# Patient Record
Sex: Female | Born: 1995 | Race: Black or African American | Hispanic: No | Marital: Married | Smoking: Former smoker
Health system: Southern US, Community
[De-identification: ages and names within clinical notes are randomized; demographics above are authoritative.]

## PROBLEM LIST (undated history)

## (undated) ENCOUNTER — Inpatient Hospital Stay (HOSPITAL_COMMUNITY): Payer: Self-pay

## (undated) ENCOUNTER — Emergency Department (HOSPITAL_COMMUNITY): Admission: EM | Payer: Self-pay | Source: Home / Self Care

## (undated) DIAGNOSIS — J45909 Unspecified asthma, uncomplicated: Secondary | ICD-10-CM

## (undated) DIAGNOSIS — F4310 Post-traumatic stress disorder, unspecified: Secondary | ICD-10-CM

## (undated) DIAGNOSIS — F909 Attention-deficit hyperactivity disorder, unspecified type: Secondary | ICD-10-CM

## (undated) DIAGNOSIS — F319 Bipolar disorder, unspecified: Secondary | ICD-10-CM

## (undated) HISTORY — PX: ABDOMINAL HERNIA REPAIR: SHX539

## (undated) HISTORY — PX: HERNIA REPAIR: SHX51

---
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
## 2013-09-19 NOTE — L&D Delivery Note (Addendum)
Delivery Note At 1:32 PM a viable female was delivered via  (Presentation: LOA ).  APGAR: 9,9 Placenta status: intact, .  Cord: 3 vessel  with the following complications:none  Anesthesia: Epidural  Episiotomy: none Lacerations: none Suture Repair: n/a Est. Blood Loss (mL): 250  Mom to postpartum.  Baby to Couplet care / Skin to Skin.  Mary Mitchell ROCIO 05/21/2014, 1:55 PM

## 2013-12-11 ENCOUNTER — Inpatient Hospital Stay (HOSPITAL_COMMUNITY)
Admission: AD | Admit: 2013-12-11 | Discharge: 2013-12-11 | Disposition: A | Discharge status: Home / Self Care | Payer: Medicaid Other | Source: Ambulatory Visit | Attending: Obstetrics & Gynecology | Admitting: Obstetrics & Gynecology

## 2013-12-11 ENCOUNTER — Encounter (HOSPITAL_COMMUNITY): Payer: Self-pay | Admitting: *Deleted

## 2013-12-11 DIAGNOSIS — R109 Unspecified abdominal pain: Secondary | ICD-10-CM | POA: Insufficient documentation

## 2013-12-11 DIAGNOSIS — O26852 Spotting complicating pregnancy, second trimester: Secondary | ICD-10-CM

## 2013-12-11 DIAGNOSIS — O26859 Spotting complicating pregnancy, unspecified trimester: Secondary | ICD-10-CM

## 2013-12-11 HISTORY — DX: Unspecified asthma, uncomplicated: J45.909

## 2013-12-11 LAB — URINALYSIS, ROUTINE W REFLEX MICROSCOPIC
Bilirubin Urine: NEGATIVE
GLUCOSE, UA: NEGATIVE mg/dL
Hgb urine dipstick: NEGATIVE
KETONES UR: NEGATIVE mg/dL
Nitrite: NEGATIVE
PROTEIN: NEGATIVE mg/dL
Specific Gravity, Urine: >1.03 — ABNORMAL HIGH (ref 1.005–1.030)
Urobilinogen, UA: 0.2 mg/dL (ref 0.0–1.0)
pH: 6 (ref 5.0–8.0)

## 2013-12-11 LAB — WET PREP, GENITAL
CLUE CELLS WET PREP: NONE SEEN
TRICH WET PREP: NONE SEEN
Yeast Wet Prep HPF POC: NONE SEEN

## 2013-12-11 LAB — URINE MICROSCOPIC-ADD ON

## 2013-12-11 MED ORDER — RHO D IMMUNE GLOBULIN 1500 UNIT/2ML IJ SOLN
300.0000 ug | Freq: Once | INTRAMUSCULAR | Status: DC
  Filled 2013-12-11: qty 2

## 2013-12-11 NOTE — MAU Provider Note (Signed)
History     CSN: [id number]  Arrival date and time: 12/11/13 1651   First Provider Initiated Contact with Patient 12/11/13 1936      Chief Complaint  Patient presents with  . Headache  . Vaginal Bleeding  . Abdominal Cramping   HPI  Mary Mitchell is a 18 y.o. G4P3 at [redacted]w[redacted]d who presents today with spotting. She states that for the last 3 days she had had spotting that she has seen while wiping. She denies any pain She had rhogam January 3 for bleeding in the first trimester.   Past Medical History  Diagnosis Date  . Asthma     Past Surgical History  Procedure Laterality Date  . Abdominal hernia repair      History reviewed. No pertinent family history.  History  Substance Use Topics  . Smoking status: Never Smoker   . Smokeless tobacco: Not on file  . Alcohol Use: No    Allergies: No Known Allergies  Prescriptions prior to admission  Medication Sig Dispense Refill  . albuterol (PROVENTIL HFA;VENTOLIN HFA) 108 (90 BASE) MCG/ACT inhaler Inhale 1-2 puffs into the lungs every 6 (six) hours as needed for wheezing or shortness of breath.      . Prenatal Vit-Fe Fumarate-FA (PRENATAL MULTIVITAMIN) TABS tablet Take 1 tablet by mouth daily at 12 noon.        ROS Physical Exam   Blood pressure 112/66, pulse 100, temperature 98.6 F (37 C), temperature source Oral, resp. rate 18, height 5\' 2"  (1.575 m), weight 81.92 kg (180 lb 9.6 oz), last menstrual period 08/14/2013, SpO2 100.00%.  Physical Exam  Nursing note and vitals reviewed. Constitutional: She is oriented to person, place, and time. She appears well-developed and well-nourished. No distress.  Cardiovascular: Normal rate.   Respiratory: Effort normal.  GI: Soft. There is no tenderness.  Genitourinary:   External: no lesion Vagina: small amount of white discharge, no blood seen Cervix: pink, smooth, no CMT, closed. Slightly friable.  Uterus: AGA, FHT 146 with doppler   Neurological: She is alert and  oriented to person, place, and time.  Skin: Skin is warm and dry.  Psychiatric: She has a normal mood and affect.    MAU Course  Procedures  Results for orders placed during the hospital encounter of 12/11/13 (from the past 24 hour(s))  URINALYSIS, ROUTINE W REFLEX MICROSCOPIC     Status: Abnormal   Collection Time    12/11/13  5:18 PM      Result Value Ref Range   Color, Urine YELLOW  YELLOW   APPearance CLEAR  CLEAR   Specific Gravity, Urine >1.030 (*) 1.005 - 1.030   pH 6.0  5.0 - 8.0   Glucose, UA NEGATIVE  NEGATIVE mg/dL   Hgb urine dipstick NEGATIVE  NEGATIVE   Bilirubin Urine NEGATIVE  NEGATIVE   Ketones, ur NEGATIVE  NEGATIVE mg/dL   Protein, ur NEGATIVE  NEGATIVE mg/dL   Urobilinogen, UA 0.2  0.0 - 1.0 mg/dL   Nitrite NEGATIVE  NEGATIVE   Leukocytes, UA TRACE (*) NEGATIVE  URINE MICROSCOPIC-ADD ON     Status: Abnormal   Collection Time    12/11/13  5:18 PM      Result Value Ref Range   Squamous Epithelial / LPF MANY (*) RARE   WBC, UA 0-2  <3 WBC/hpf   RBC / HPF 0-2  <3 RBC/hpf   Bacteria, UA MANY (*) RARE   Urine-Other MUCOUS PRESENT    WET PREP, GENITAL  Status: Abnormal   Collection Time    12/11/13  7:45 PM      Result Value Ref Range   Yeast Wet Prep HPF POC NONE SEEN  NONE SEEN   Trich, Wet Prep NONE SEEN  NONE SEEN   Clue Cells Wet Prep HPF POC NONE SEEN  NONE SEEN   WBC, Wet Prep HPF POC MODERATE (*) NONE SEEN    D/W Dr. Despina HiddenEure will give rhogam today Patient states that she is not able to wait at this time. She states that she knows the importance of rhogam, and will come back in the morning.  Assessment and Plan   1. Spotting complicating pregnancy in second trimester, antepartum    RH negative patient Cannot wait for rhogam today Will return in the morning for rhogam Patient verbalizes understanding   Tawnya CrookHogan, Julyan Gales Donovan 12/11/2013, 7:58 PM

## 2013-12-11 NOTE — MAU Note (Signed)
Spotting for the last 3 days, cramping for the last week, passing clots.  C/O severe HA's.  [place] [facility] to [place], [facility].

## 2013-12-11 NOTE — Discharge Instructions (Signed)
Second Trimester of Pregnancy The second trimester is from week 13 through week 28, months 4 through 6. The second trimester is often a time when you feel your best. Your body has also adjusted to being pregnant, and you begin to feel better physically. Usually, morning sickness has lessened or quit completely, you may have more energy, and you may have an increase in appetite. The second trimester is also a time when the fetus is growing rapidly. At the end of the sixth month, the fetus is about 9 inches long and weighs about 1 pounds. You will likely begin to feel the baby move (quickening) between 18 and 20 weeks of the pregnancy. BODY CHANGES Your body goes through many changes during pregnancy. The changes vary from woman to woman.   Your weight will continue to increase. You will notice your lower abdomen bulging out.  You may begin to get stretch marks on your hips, abdomen, and breasts.  You may develop headaches that can be relieved by medicines approved by your caregiver.  You may urinate more often because the fetus is pressing on your bladder.  You may develop or continue to have heartburn as a result of your pregnancy.  You may develop constipation because certain hormones are causing the muscles that push waste through your intestines to slow down.  You may develop hemorrhoids or swollen, bulging veins (varicose veins).  You may have back pain because of the weight gain and pregnancy hormones relaxing your joints between the bones in your pelvis and as a result of a shift in weight and the muscles that support your balance.  Your breasts will continue to grow and be tender.  Your gums may bleed and may be sensitive to brushing and flossing.  Dark spots or blotches (chloasma, mask of pregnancy) may develop on your face. This will likely fade after the baby is born.  A dark line from your belly button to the pubic area (linea nigra) may appear. This will likely fade after the  baby is born. WHAT TO EXPECT AT YOUR PRENATAL VISITS During a routine prenatal visit:  You will be weighed to make sure you and the fetus are growing normally.  Your blood pressure will be taken.  Your abdomen will be measured to track your baby's growth.  The fetal heartbeat will be listened to.  Any test results from the previous visit will be discussed. Your caregiver may ask you:  How you are feeling.  If you are feeling the baby move.  If you have had any abnormal symptoms, such as leaking fluid, bleeding, severe headaches, or abdominal cramping.  If you have any questions. Other tests that may be performed during your second trimester include:  Blood tests that check for:  Low iron levels (anemia).  Gestational diabetes (between 24 and 28 weeks).  Rh antibodies.  Urine tests to check for infections, diabetes, or protein in the urine.  An ultrasound to confirm the proper growth and development of the baby.  An amniocentesis to check for possible genetic problems.  Fetal screens for spina bifida and Down syndrome. HOME CARE INSTRUCTIONS   Avoid all smoking, herbs, alcohol, and unprescribed drugs. These chemicals affect the formation and growth of the baby.  Follow your caregiver's instructions regarding medicine use. There are medicines that are either safe or unsafe to take during pregnancy.  Exercise only as directed by your caregiver. Experiencing uterine cramps is a good sign to stop exercising.  Continue to eat regular,   healthy meals.  Wear a good support bra for breast tenderness.  Do not use hot tubs, steam rooms, or saunas.  Wear your seat belt at all times when driving.  Avoid raw meat, uncooked cheese, cat litter boxes, and soil used by cats. These carry germs that can cause birth defects in the baby.  Take your prenatal vitamins.  Try taking a stool softener (if your caregiver approves) if you develop constipation. Eat more high-fiber foods,  such as fresh vegetables or fruit and whole grains. Drink plenty of fluids to keep your urine clear or pale yellow.  Take warm sitz baths to soothe any pain or discomfort caused by hemorrhoids. Use hemorrhoid cream if your caregiver approves.  If you develop varicose veins, wear support hose. Elevate your feet for 15 minutes, 3 4 times a day. Limit salt in your diet.  Avoid heavy lifting, wear low heel shoes, and practice good posture.  Rest with your legs elevated if you have leg cramps or low back pain.  Visit your dentist if you have not gone yet during your pregnancy. Use a soft toothbrush to brush your teeth and be gentle when you floss.  A sexual relationship may be continued unless your caregiver directs you otherwise.  Continue to go to all your prenatal visits as directed by your caregiver. SEEK MEDICAL CARE IF:   You have dizziness.  You have mild pelvic cramps, pelvic pressure, or nagging pain in the abdominal area.  You have persistent nausea, vomiting, or diarrhea.  You have a bad smelling vaginal discharge.  You have pain with urination. SEEK IMMEDIATE MEDICAL CARE IF:   You have a fever.  You are leaking fluid from your vagina.  You have spotting or bleeding from your vagina.  You have severe abdominal cramping or pain.  You have rapid weight gain or loss.  You have shortness of breath with chest pain.  You notice sudden or extreme swelling of your face, hands, ankles, feet, or legs.  You have not felt your baby move in over an hour.  You have severe headaches that do not go away with medicine.  You have vision changes. Document Released: 08/30/2001 Document Revised: 05/08/2013 Document Reviewed: 11/06/2012 [employer] Patient Information 2014 [employer].  

## 2013-12-12 LAB — GC/CHLAMYDIA PROBE AMP
CT Probe RNA: NEGATIVE
GC PROBE AMP APTIMA: NEGATIVE

## 2014-01-07 ENCOUNTER — Encounter: Payer: Self-pay | Admitting: Family Medicine

## 2014-01-17 ENCOUNTER — Telehealth: Payer: Self-pay | Admitting: Obstetrics and Gynecology

## 2014-01-17 ENCOUNTER — Inpatient Hospital Stay (HOSPITAL_COMMUNITY)
Admission: AD | Admit: 2014-01-17 | Discharge: 2014-01-17 | Disposition: A | Discharge status: Home / Self Care | Payer: Medicaid Other | Source: Ambulatory Visit | Attending: Obstetrics and Gynecology | Admitting: Obstetrics and Gynecology

## 2014-01-17 ENCOUNTER — Encounter (HOSPITAL_COMMUNITY): Payer: Self-pay | Admitting: *Deleted

## 2014-01-17 DIAGNOSIS — R030 Elevated blood-pressure reading, without diagnosis of hypertension: Secondary | ICD-10-CM | POA: Insufficient documentation

## 2014-01-17 DIAGNOSIS — IMO0001 Reserved for inherently not codable concepts without codable children: Secondary | ICD-10-CM

## 2014-01-17 DIAGNOSIS — E860 Dehydration: Secondary | ICD-10-CM

## 2014-01-17 DIAGNOSIS — R519 Headache, unspecified: Secondary | ICD-10-CM

## 2014-01-17 DIAGNOSIS — O99891 Other specified diseases and conditions complicating pregnancy: Secondary | ICD-10-CM | POA: Insufficient documentation

## 2014-01-17 DIAGNOSIS — R51 Headache: Secondary | ICD-10-CM | POA: Insufficient documentation

## 2014-01-17 DIAGNOSIS — O99330 Smoking (tobacco) complicating pregnancy, unspecified trimester: Secondary | ICD-10-CM | POA: Insufficient documentation

## 2014-01-17 DIAGNOSIS — R42 Dizziness and giddiness: Secondary | ICD-10-CM | POA: Insufficient documentation

## 2014-01-17 DIAGNOSIS — O0930 Supervision of pregnancy with insufficient antenatal care, unspecified trimester: Secondary | ICD-10-CM | POA: Insufficient documentation

## 2014-01-17 DIAGNOSIS — O26899 Other specified pregnancy related conditions, unspecified trimester: Secondary | ICD-10-CM

## 2014-01-17 DIAGNOSIS — O9989 Other specified diseases and conditions complicating pregnancy, childbirth and the puerperium: Principal | ICD-10-CM

## 2014-01-17 DIAGNOSIS — O368190 Decreased fetal movements, unspecified trimester, not applicable or unspecified: Secondary | ICD-10-CM | POA: Insufficient documentation

## 2014-01-17 DIAGNOSIS — O0932 Supervision of pregnancy with insufficient antenatal care, second trimester: Secondary | ICD-10-CM

## 2014-01-17 DIAGNOSIS — N949 Unspecified condition associated with female genital organs and menstrual cycle: Secondary | ICD-10-CM | POA: Insufficient documentation

## 2014-01-17 DIAGNOSIS — R109 Unspecified abdominal pain: Secondary | ICD-10-CM | POA: Insufficient documentation

## 2014-01-17 LAB — URINALYSIS, ROUTINE W REFLEX MICROSCOPIC
BILIRUBIN URINE: NEGATIVE
GLUCOSE, UA: NEGATIVE mg/dL
HGB URINE DIPSTICK: NEGATIVE
KETONES UR: NEGATIVE mg/dL
Leukocytes, UA: NEGATIVE
Nitrite: NEGATIVE
PH: 6.5 (ref 5.0–8.0)
Protein, ur: NEGATIVE mg/dL
Specific Gravity, Urine: >1.03 — ABNORMAL HIGH (ref 1.005–1.030)
Urobilinogen, UA: 0.2 mg/dL (ref 0.0–1.0)

## 2014-01-17 LAB — COMPREHENSIVE METABOLIC PANEL
ALT: 7 U/L (ref 0–35)
AST: 12 U/L (ref 0–37)
Albumin: 2.9 g/dL — ABNORMAL LOW (ref 3.5–5.2)
Alkaline Phosphatase: 81 U/L (ref 39–117)
BUN: 7 mg/dL (ref 6–23)
CALCIUM: 9.1 mg/dL (ref 8.4–10.5)
CO2: 23 mEq/L (ref 19–32)
Chloride: 104 mEq/L (ref 96–112)
Creatinine, Ser: 0.48 mg/dL — ABNORMAL LOW (ref 0.50–1.10)
GLUCOSE: 76 mg/dL (ref 70–99)
Potassium: 3.9 mEq/L (ref 3.7–5.3)
Sodium: 139 mEq/L (ref 137–147)
TOTAL PROTEIN: 6.5 g/dL (ref 6.0–8.3)
Total Bilirubin: <0.2 mg/dL — ABNORMAL LOW (ref 0.3–1.2)

## 2014-01-17 LAB — CBC
HEMATOCRIT: 32.7 % — AB (ref 36.0–46.0)
HEMOGLOBIN: 10.9 g/dL — AB (ref 12.0–15.0)
MCH: 26.3 pg (ref 26.0–34.0)
MCHC: 33.3 g/dL (ref 30.0–36.0)
MCV: 79 fL (ref 78.0–100.0)
Platelets: 205 10*3/uL (ref 150–400)
RBC: 4.14 MIL/uL (ref 3.87–5.11)
RDW: 13.9 % (ref 11.5–15.5)
WBC: 6.6 10*3/uL (ref 4.0–10.5)

## 2014-01-17 LAB — WET PREP, GENITAL
Trich, Wet Prep: NONE SEEN
YEAST WET PREP: NONE SEEN

## 2014-01-17 LAB — PROTEIN / CREATININE RATIO, URINE
Creatinine, Urine: 205.58 mg/dL
Protein Creatinine Ratio: 0.08 (ref 0.00–0.15)
Total Protein, Urine: 16.5 mg/dL

## 2014-01-17 MED ORDER — RHO D IMMUNE GLOBULIN 1500 UNIT/2ML IJ SOSY
300.0000 ug | PREFILLED_SYRINGE | Freq: Once | INTRAMUSCULAR | Status: AC
  Administered 2014-01-17: 300 ug via INTRAMUSCULAR
  Filled 2014-01-17: qty 2

## 2014-01-17 MED ORDER — BUTALBITAL-APAP-CAFFEINE 50-325-40 MG PO TABS
1.0000 | ORAL_TABLET | Freq: Once | ORAL | Status: AC
  Administered 2014-01-17: 1 via ORAL
  Filled 2014-01-17: qty 1

## 2014-01-17 NOTE — MAU Note (Signed)
Had spotting about 1.5 weeks ago and did not receive a rhogam shot; c/o headache ,dizziness and groin pain for past 2 weeks;

## 2014-01-17 NOTE — MAU Provider Note (Signed)
Chief Complaint:  Headache, Dizziness and Groin Pain  @MAUPATCONTACT @  HPI: Mary Mitchell is a 18 y.o. G4P3 at [redacted]w[redacted]d who presents to maternity admissions reporting headache, dizziness, and pelvic pain x 2 weeks.  Patient states that she woke up around 0200 with significant headache, felt like "whole head was swollen", associated with blurry vision with dizziness temporarily for about 5 min after standing and getting up, since resolved, currently denies blurry vision or dizziness. Admits to constant frontal HA x 1 week without relief, occasional intermittent flares lasting hours with worsening pain, not relieved rest or Tylenol. Also, reports pain on Right side of abdomen similar to contractions with also sharp shooting pains bilateral. Additionally, reports vomiting x 3 days ago after waking up, felt dizziness and lightheaded.   Patient reports that she recently had an episode with "lightish pink spotting" small amount about 1.5 weeks ago. Significant prior hx with prior vaginal bleeding episode on 12/11/13 with small amount of dark blood following pelvic exam, advised to get RhoGam (unable to stay and did not follow-up to receive it).  Also reports some irregular uterine / abdominal cramping.  Admits to decreased fetal movement, last felt move yesterday. Denies contractions, leakage of fluid or vaginal bleeding, dysuria, itching or burning.  Pregnancy Course: Currently no PNC. [facility]. Denies any hx of GHTN. - Maternal blood type O (negative)  Prior Pregnancy Course: - [facility], [facility] - x 3 full term, induced  Past Medical History: Past Medical History  Diagnosis Date  . Asthma     Past obstetric history: OB History  Gravida Para Term Preterm AB SAB TAB Ectopic Multiple Living  4 3        3     # Outcome Date GA Lbr Len/2nd Weight Sex Delivery Anes PTL Lv  4 CUR           3 PAR           2 PAR           1 PAR               Past Surgical  History: Past Surgical History  Procedure Laterality Date  . Abdominal hernia repair      Family History: Family History  Problem Relation Age of Onset  . Diabetes Mother   . Hypertension Mother   . Diabetes Maternal Grandmother   . Hypertension Maternal Grandmother   . Heart disease Maternal Grandmother     Social History: History  Substance Use Topics  . Smoking status: Current Some Day Smoker -- 0.25 packs/day  . Smokeless tobacco: Not on file  . Alcohol Use: No    Allergies: No Known Allergies  Meds:  Prescriptions prior to admission  Medication Sig Dispense Refill  . acetaminophen (TYLENOL) 500 MG tablet Take 1,000 mg by mouth every 6 (six) hours as needed for headache.      . Prenatal Vit-Fe Fumarate-FA (PRENATAL MULTIVITAMIN) TABS tablet Take 1 tablet by mouth daily at 12 noon.      Marland Kitchen albuterol (PROVENTIL HFA;VENTOLIN HFA) 108 (90 BASE) MCG/ACT inhaler Inhale 1-2 puffs into the lungs every 6 (six) hours as needed for wheezing or shortness of breath.        ROS: Pertinent findings in history of present illness.  Physical Exam  Blood pressure 128/65, pulse 112, temperature 98.5 F (36.9 C), temperature source Oral, resp. rate 18, height 5' 1.5" (1.562 m), weight 83.462 kg (184 lb), last menstrual  period 08/14/2013. GENERAL: Well-appearing, comfortable, conversational, NAD HEENT: PERRL, EOMI, mild dry MM HEART: RRR, no murmurs RESP: CTAB, nml effort ABDOMEN: Soft, non-tender to palpation, gravid appropriate for gestational age EXTREMITIES: Nontender, no edema NEURO: alert and oriented PELVIC EXAM: NEFG, no evidence of bleeding, thick physiologic white discharge, cervix appears normal without lesions.  Dilation: Closed Effacement (%): Thick Cervical Position: Posterior Station: Ballotable Exam by:: L Leftwich-Kirby CNM   Dilation: Closed Effacement (%): Thick Cervical Position: Posterior Station: Ballotable Exam by:: L Leftwich-Kirby CNM  FHT:  Baseline  150 bpm, moderate variability, accelerations present, no decelerations Contractions: none   Labs: Results for orders placed during the hospital encounter of 01/17/14 (from the past 24 hour(s))  URINALYSIS, ROUTINE W REFLEX MICROSCOPIC     Status: Abnormal   Collection Time    01/17/14  4:00 PM      Result Value Ref Range   Color, Urine YELLOW  YELLOW   APPearance CLEAR  CLEAR   Specific Gravity, Urine >1.030 (*) 1.005 - 1.030   pH 6.5  5.0 - 8.0   Glucose, UA NEGATIVE  NEGATIVE mg/dL   Hgb urine dipstick NEGATIVE  NEGATIVE   Bilirubin Urine NEGATIVE  NEGATIVE   Ketones, ur NEGATIVE  NEGATIVE mg/dL   Protein, ur NEGATIVE  NEGATIVE mg/dL   Urobilinogen, UA 0.2  0.0 - 1.0 mg/dL   Nitrite NEGATIVE  NEGATIVE   Leukocytes, UA NEGATIVE  NEGATIVE  CBC     Status: Abnormal   Collection Time    01/17/14  6:20 PM      Result Value Ref Range   WBC 6.6  4.0 - 10.5 K/uL   RBC 4.14  3.87 - 5.11 MIL/uL   Hemoglobin 10.9 (*) 12.0 - 15.0 g/dL   HCT 16.132.7 (*) 09.636.0 - 04.546.0 %   MCV 79.0  78.0 - 100.0 fL   MCH 26.3  26.0 - 34.0 pg   MCHC 33.3  30.0 - 36.0 g/dL   RDW 40.913.9  81.111.5 - 91.415.5 %   Platelets 205  150 - 400 K/uL  COMPREHENSIVE METABOLIC PANEL     Status: Abnormal   Collection Time    01/17/14  6:20 PM      Result Value Ref Range   Sodium 139  137 - 147 mEq/L   Potassium 3.9  3.7 - 5.3 mEq/L   Chloride 104  96 - 112 mEq/L   CO2 23  19 - 32 mEq/L   Glucose, Bld 76  70 - 99 mg/dL   BUN 7  6 - 23 mg/dL   Creatinine, Ser 7.820.48 (*) 0.50 - 1.10 mg/dL   Calcium 9.1  8.4 - 95.610.5 mg/dL   Total Protein 6.5  6.0 - 8.3 g/dL   Albumin 2.9 (*) 3.5 - 5.2 g/dL   AST 12  0 - 37 U/L   ALT 7  0 - 35 U/L   Alkaline Phosphatase 81  39 - 117 U/L   Total Bilirubin <0.2 (*) 0.3 - 1.2 mg/dL   GFR calc non Af Amer >90  >90 mL/min   GFR calc Af Amer >90  >90 mL/min    Imaging:  No results found. MAU Course:   Assessment: 1. Headache in pregnancy   2. Elevated blood pressure   3. Mild dehydration      Mary Mitchell is a 18 y.o. G4P3 at 2717w2d by LMP presented to MAU with multiple complaints, primarily HA x 1 week with intermittent worsening occasional blurry vision /  dizziness on standing (resolved), [facility]. Initial vitals with x1 elevated BP 149/81 (since improved to SBP 110 to 120), currently with improved mild HA, no vision changes, no RUQ pain, or edema. Additionally, FWB reassuring with good FHT >150 also placed on monitoring strip with reactive tracing. Ordered UA to evaluate for potential dehydration as etiology of some symptoms, and check urine for protein.  UPDATE 1820 - Ordered Fioricet for persistent HA, decision to further investigate with Pre-eclampsia labs for baseline data given [facility]. Ordered CMET and CBC (normal, plt, LFTs). UA with elevated spec grav >1.030 (consistent with mild dehydration, reassuring with Negative Protein on UA).  UPDATE 1855 - Pelvic exam without bleeding, moderate thick white/mucus discharge. Normal closed cervix. Collected wet prep and GC/Chalmydia.  Plan: - UA mildly dehydrated with elevated spec grav >1.030, encouraged improved hydration - No indication for RhoGam at this time, due to no active bleeding - BP monitoring with subsequent normal BPs (initial SBP >140 x 1), advise future monitoring for potential GHTN - [facility] (alrdy scheduled 01/30/14).  Outpatient anatomy scan U/S ordered. - Pending UPC, GC/Chlamydia (will notify patient if significant results, follow-up in clinic) - Discharge to home with close follow-up      Follow-up Information   [facility]. (at 9:00am - Initial Prenatal Visit)    Specialty:  Obstetrics and Gynecology   Contact information:   [address] [place] [id number] [phone]       Medication List         acetaminophen 500 MG tablet  Commonly known as:  TYLENOL   Take 1,000 mg by mouth every 6 (six) hours as needed for headache.     albuterol 108 (90 BASE) MCG/ACT inhaler  Commonly known as:  PROVENTIL HFA;VENTOLIN HFA  Inhale 1-2 puffs into the lungs every 6 (six) hours as needed for wheezing or shortness of breath.     prenatal multivitamin Tabs tablet  Take 1 tablet by mouth daily at 12 noon.        Saralyn PilarAlexander Karamalegos, DO [facility], PGY-1 01/17/2014 7:11 PM  I have seen this patient and agree with the above resident's note.  Addendum: Pt returned to MAU for Rhogam work-up and Rophylac within 1 hour of discharge.    LEFTWICH-KIRBY, Hussien Greenblatt Certified Nurse-Midwife

## 2014-01-17 NOTE — Telephone Encounter (Signed)
Called pt a few minutes after her discharge from MAU to recommend she return for Rhogam work-up and injection. Pt across the street from [facility] getting food so will return to MAU tonight for lab/injection.

## 2014-01-17 NOTE — Discharge Instructions (Signed)
Second Trimester of Pregnancy The second trimester is from week 13 through week 28, months 4 through 6. The second trimester is often a time when you feel your best. Your body has also adjusted to being pregnant, and you begin to feel better physically. Usually, morning sickness has lessened or quit completely, you may have more energy, and you may have an increase in appetite. The second trimester is also a time when the fetus is growing rapidly. At the end of the sixth month, the fetus is about 9 inches long and weighs about 1 pounds. You will likely begin to feel the baby move (quickening) between 18 and 20 weeks of the pregnancy. BODY CHANGES Your body goes through many changes during pregnancy. The changes vary from woman to woman.   Your weight will continue to increase. You will notice your lower abdomen bulging out.  You may begin to get stretch marks on your hips, abdomen, and breasts.  You may develop headaches that can be relieved by medicines approved by your caregiver.  You may urinate more often because the fetus is pressing on your bladder.  You may develop or continue to have heartburn as a result of your pregnancy.  You may develop constipation because certain hormones are causing the muscles that push waste through your intestines to slow down.  You may develop hemorrhoids or swollen, bulging veins (varicose veins).  You may have back pain because of the weight gain and pregnancy hormones relaxing your joints between the bones in your pelvis and as a result of a shift in weight and the muscles that support your balance.  Your breasts will continue to grow and be tender.  Your gums may bleed and may be sensitive to brushing and flossing.  Dark spots or blotches (chloasma, mask of pregnancy) may develop on your face. This will likely fade after the baby is born.  A dark line from your belly button to the pubic area (linea nigra) may appear. This will likely fade after the  baby is born. WHAT TO EXPECT AT YOUR PRENATAL VISITS During a routine prenatal visit:  You will be weighed to make sure you and the fetus are growing normally.  Your blood pressure will be taken.  Your abdomen will be measured to track your baby's growth.  The fetal heartbeat will be listened to.  Any test results from the previous visit will be discussed. Your caregiver may ask you:  How you are feeling.  If you are feeling the baby move.  If you have had any abnormal symptoms, such as leaking fluid, bleeding, severe headaches, or abdominal cramping.  If you have any questions. Other tests that may be performed during your second trimester include:  Blood tests that check for:  Low iron levels (anemia).  Gestational diabetes (between 24 and 28 weeks).  Rh antibodies.  Urine tests to check for infections, diabetes, or protein in the urine.  An ultrasound to confirm the proper growth and development of the baby.  An amniocentesis to check for possible genetic problems.  Fetal screens for spina bifida and Down syndrome. HOME CARE INSTRUCTIONS   Avoid all smoking, herbs, alcohol, and unprescribed drugs. These chemicals affect the formation and growth of the baby.  Follow your caregiver's instructions regarding medicine use. There are medicines that are either safe or unsafe to take during pregnancy.  Exercise only as directed by your caregiver. Experiencing uterine cramps is a good sign to stop exercising.  Continue to eat regular,  healthy meals.  Wear a good support bra for breast tenderness.  Do not use hot tubs, steam rooms, or saunas.  Wear your seat belt at all times when driving.  Avoid raw meat, uncooked cheese, cat litter boxes, and soil used by cats. These carry germs that can cause birth defects in the baby.  Take your prenatal vitamins.  Try taking a stool softener (if your caregiver approves) if you develop constipation. Eat more high-fiber foods,  such as fresh vegetables or fruit and whole grains. Drink plenty of fluids to keep your urine clear or pale yellow.  Take warm sitz baths to soothe any pain or discomfort caused by hemorrhoids. Use hemorrhoid cream if your caregiver approves.  If you develop varicose veins, wear support hose. Elevate your feet for 15 minutes, 3 4 times a day. Limit salt in your diet.  Avoid heavy lifting, wear low heel shoes, and practice good posture.  Rest with your legs elevated if you have leg cramps or low back pain.  Visit your dentist if you have not gone yet during your pregnancy. Use a soft toothbrush to brush your teeth and be gentle when you floss.  A sexual relationship may be continued unless your caregiver directs you otherwise.  Continue to go to all your prenatal visits as directed by your caregiver. SEEK MEDICAL CARE IF:   You have dizziness.  You have mild pelvic cramps, pelvic pressure, or nagging pain in the abdominal area.  You have persistent nausea, vomiting, or diarrhea.  You have a bad smelling vaginal discharge.  You have pain with urination. SEEK IMMEDIATE MEDICAL CARE IF:   You have a fever.  You are leaking fluid from your vagina.  You have spotting or bleeding from your vagina.  You have severe abdominal cramping or pain.  You have rapid weight gain or loss.  You have shortness of breath with chest pain.  You notice sudden or extreme swelling of your face, hands, ankles, feet, or legs.  You have not felt your baby move in over an hour.  You have severe headaches that do not go away with medicine.  You have vision changes. Document Released: 08/30/2001 Document Revised: 05/08/2013 Document Reviewed: 11/06/2012 [facility] Patient Information 2014 [place], [place]. Headaches, Frequently Asked Questions MIGRAINE HEADACHES Q: What is migraine? What causes it? How can I treat it? A: Generally, migraine headaches begin as a dull ache. Then they develop into a  constant, throbbing, and pulsating pain. You may experience pain at the temples. You may experience pain at the front or back of one or both sides of the head. The pain is usually accompanied by a combination of:  Nausea.  Vomiting.  Sensitivity to light and noise. Some people (about 15%) experience an aura (see below) before an attack. The cause of migraine is believed to be chemical reactions in the brain. Treatment for migraine may include over-the-counter or prescription medications. It may also include self-help techniques. These include relaxation training and biofeedback.  Q: What is an aura? A: About 15% of people with migraine get an "aura". This is a sign of neurological symptoms that occur before a migraine headache. You may see wavy or jagged lines, dots, or flashing lights. You might experience tunnel vision or blind spots in one or both eyes. The aura can include visual or auditory hallucinations (something imagined). It may include disruptions in smell (such as strange odors), taste or touch. Other symptoms include:  Numbness.  A "pins and needles" sensation.  Difficulty in recalling or speaking the correct word. These neurological events may last as long as 60 minutes. These symptoms will fade as the headache begins. Q: What is a trigger? A: Certain physical or environmental factors can lead to or "trigger" a migraine. These include:  Foods.  Hormonal changes.  Weather.  Stress. It is important to remember that triggers are different for everyone. To help prevent migraine attacks, you need to figure out which triggers affect you. Keep a headache diary. This is a good way to track triggers. The diary will help you talk to your healthcare professional about your condition. Q: Does weather affect migraines? A: Bright sunshine, hot, humid conditions, and drastic changes in barometric pressure may lead to, or "trigger," a migraine attack in some people. But studies have shown  that weather does not act as a trigger for everyone with migraines. Q: What is the link between migraine and hormones? A: Hormones start and regulate many of your body's functions. Hormones keep your body in balance within a constantly changing environment. The levels of hormones in your body are unbalanced at times. Examples are during menstruation, pregnancy, or menopause. That can lead to a migraine attack. In fact, about three quarters of all women with migraine report that their attacks are related to the menstrual cycle.  Q: Is there an increased risk of stroke for migraine sufferers? A: The likelihood of a migraine attack causing a stroke is very remote. That is not to say that migraine sufferers cannot have a stroke associated with their migraines. In persons under age 42, the most common associated factor for stroke is migraine headache. But over the course of a person's normal life span, the occurrence of migraine headache may actually be associated with a reduced risk of dying from cerebrovascular disease due to stroke.  Q: What are acute medications for migraine? A: Acute medications are used to treat the pain of the headache after it has started. Examples over-the-counter medications, NSAIDs, ergots, and triptans.  Q: What are the triptans? A: Triptans are the newest class of abortive medications. They are specifically targeted to treat migraine. Triptans are vasoconstrictors. They moderate some chemical reactions in the brain. The triptans work on receptors in your brain. Triptans help to restore the balance of a neurotransmitter called serotonin. Fluctuations in levels of serotonin are thought to be a main cause of migraine.  Q: Are over-the-counter medications for migraine effective? A: Over-the-counter, or "OTC," medications may be effective in relieving mild to moderate pain and associated symptoms of migraine. But you should see your caregiver before beginning any treatment regimen for  migraine.  Q: What are preventive medications for migraine? A: Preventive medications for migraine are sometimes referred to as "prophylactic" treatments. They are used to reduce the frequency, severity, and length of migraine attacks. Examples of preventive medications include antiepileptic medications, antidepressants, beta-blockers, calcium channel blockers, and NSAIDs (nonsteroidal anti-inflammatory drugs). Q: Why are anticonvulsants used to treat migraine? A: During the past few years, there has been an increased interest in antiepileptic drugs for the prevention of migraine. They are sometimes referred to as "anticonvulsants". Both epilepsy and migraine may be caused by similar reactions in the brain.  Q: Why are antidepressants used to treat migraine? A: Antidepressants are typically used to treat people with depression. They may reduce migraine frequency by regulating chemical levels, such as serotonin, in the brain.  Q: What alternative therapies are used to treat migraine? A: The term "alternative therapies" is often used  to describe treatments considered outside the scope of conventional Western medicine. Examples of alternative therapy include acupuncture, acupressure, and yoga. Another common alternative treatment is herbal therapy. Some herbs are believed to relieve headache pain. Always discuss alternative therapies with your caregiver before proceeding. Some herbal products contain arsenic and other toxins. TENSION HEADACHES Q: What is a tension-type headache? What causes it? How can I treat it? A: Tension-type headaches occur randomly. They are often the result of temporary stress, anxiety, fatigue, or anger. Symptoms include soreness in your temples, a tightening band-like sensation around your head (a "vice-like" ache). Symptoms can also include a pulling feeling, pressure sensations, and contracting head and neck muscles. The headache begins in your forehead, temples, or the back of  your head and neck. Treatment for tension-type headache may include over-the-counter or prescription medications. Treatment may also include self-help techniques such as relaxation training and biofeedback. CLUSTER HEADACHES Q: What is a cluster headache? What causes it? How can I treat it? A: Cluster headache gets its name because the attacks come in groups. The pain arrives with little, if any, warning. It is usually on one side of the head. A tearing or bloodshot eye and a runny nose on the same side of the headache may also accompany the pain. Cluster headaches are believed to be caused by chemical reactions in the brain. They have been described as the most severe and intense of any headache type. Treatment for cluster headache includes prescription medication and oxygen. SINUS HEADACHES Q: What is a sinus headache? What causes it? How can I treat it? A: When a cavity in the bones of the face and skull (a sinus) becomes inflamed, the inflammation will cause localized pain. This condition is usually the result of an allergic reaction, a tumor, or an infection. If your headache is caused by a sinus blockage, such as an infection, you will probably have a fever. An x-ray will confirm a sinus blockage. Your caregiver's treatment might include antibiotics for the infection, as well as antihistamines or decongestants.  REBOUND HEADACHES Q: What is a rebound headache? What causes it? How can I treat it? A: A pattern of taking acute headache medications too often can lead to a condition known as "rebound headache." A pattern of taking too much headache medication includes taking it more than 2 days per week or in excessive amounts. That means more than the label or a caregiver advises. With rebound headaches, your medications not only stop relieving pain, they actually begin to cause headaches. Doctors treat rebound headache by tapering the medication that is being overused. Sometimes your caregiver will  gradually substitute a different type of treatment or medication. Stopping may be a challenge. Regularly overusing a medication increases the potential for serious side effects. Consult a caregiver if you regularly use headache medications more than 2 days per week or more than the label advises. ADDITIONAL QUESTIONS AND ANSWERS Q: What is biofeedback? A: Biofeedback is a self-help treatment. Biofeedback uses special equipment to monitor your body's involuntary physical responses. Biofeedback monitors:  Breathing.  Pulse.  Heart rate.  Temperature.  Muscle tension.  Brain activity. Biofeedback helps you refine and perfect your relaxation exercises. You learn to control the physical responses that are related to stress. Once the technique has been mastered, you do not need the equipment any more. Q: Are headaches hereditary? A: Four out of five (80%) of people that suffer report a family history of migraine. Scientists are not sure if this is  genetic or a family predisposition. Despite the uncertainty, a child has a 50% chance of having migraine if one parent suffers. The child has a 75% chance if both parents suffer.  Q: Can children get headaches? A: By the time they reach high school, most young people have experienced some type of headache. Many safe and effective approaches or medications can prevent a headache from occurring or stop it after it has begun.  Q: What type of doctor should I see to diagnose and treat my headache? A: Start with your primary caregiver. Discuss his or her experience and approach to headaches. Discuss methods of classification, diagnosis, and treatment. Your caregiver may decide to recommend you to a headache specialist, depending upon your symptoms or other physical conditions. Having diabetes, allergies, etc., may require a more comprehensive and inclusive approach to your headache. The National Headache Foundation will provide, upon request, a list of [facility]  physician members in your state. Document Released: 11/26/2003 Document Revised: 11/28/2011 Document Reviewed: 05/05/2008 [facility] Patient Information 2014 [place], [place].

## 2014-01-17 NOTE — MAU Note (Signed)
Last night woke up,  And felt like whole head was swollen. Has a massive headache, got better, than bad again. Saw spots when first woke up.

## 2014-01-17 NOTE — MAU Note (Signed)
1535- not in lobby- had taken kids to bathroom. Still gone now

## 2014-01-17 NOTE — MAU Provider Note (Signed)
Attestation of Attending Supervision of Advanced Practitioner (CNM/NP): Evaluation and management procedures were performed by the Advanced Practitioner under my supervision and collaboration.  I have reviewed the Advanced Practitioner's note and chart, and I agree with the management and plan.  Vickii Volland 01/17/2014 9:19 PM

## 2014-01-18 ENCOUNTER — Ambulatory Visit (HOSPITAL_COMMUNITY): Payer: Medicaid Other

## 2014-01-18 LAB — RH IG WORKUP (INCLUDES ABO/RH)
ABO/RH(D): A NEG
Antibody Screen: NEGATIVE
Fetal Screen: NEGATIVE
GESTATIONAL AGE(WKS): 22
Unit division: 0

## 2014-01-18 LAB — GC/CHLAMYDIA PROBE AMP
CT PROBE, AMP APTIMA: NEGATIVE
GC Probe RNA: NEGATIVE

## 2014-01-20 NOTE — Progress Notes (Signed)
Chart reviewed for clarification to assist radiology scheduler in scheduling appropriate appointment for patient.

## 2014-01-21 ENCOUNTER — Other Ambulatory Visit (HOSPITAL_COMMUNITY): Payer: Self-pay | Admitting: Advanced Practice Midwife

## 2014-01-21 ENCOUNTER — Ambulatory Visit (HOSPITAL_COMMUNITY)
Admission: RE | Admit: 2014-01-21 | Discharge: 2014-01-21 | Disposition: A | Discharge status: Home / Self Care | Payer: Medicaid Other | Source: Ambulatory Visit | Attending: Advanced Practice Midwife | Admitting: Advanced Practice Midwife

## 2014-01-21 DIAGNOSIS — O0932 Supervision of pregnancy with insufficient antenatal care, second trimester: Secondary | ICD-10-CM

## 2014-01-21 DIAGNOSIS — O0930 Supervision of pregnancy with insufficient antenatal care, unspecified trimester: Secondary | ICD-10-CM | POA: Insufficient documentation

## 2014-01-21 DIAGNOSIS — Z3689 Encounter for other specified antenatal screening: Secondary | ICD-10-CM | POA: Insufficient documentation

## 2014-01-21 DIAGNOSIS — Z3480 Encounter for supervision of other normal pregnancy, unspecified trimester: Secondary | ICD-10-CM

## 2014-01-22 ENCOUNTER — Encounter: Payer: Medicaid Other | Admitting: Advanced Practice Midwife

## 2014-01-24 DIAGNOSIS — Z3480 Encounter for supervision of other normal pregnancy, unspecified trimester: Secondary | ICD-10-CM | POA: Insufficient documentation

## 2014-01-30 ENCOUNTER — Encounter: Payer: Self-pay | Admitting: Family Medicine

## 2014-01-30 ENCOUNTER — Ambulatory Visit (INDEPENDENT_AMBULATORY_CARE_PROVIDER_SITE_OTHER): Payer: Medicaid Other | Admitting: Family Medicine

## 2014-01-30 VITALS — BP 127/82 | HR 99 | Temp 98.1°F | Ht 63.0 in | Wt 185.9 lb

## 2014-01-30 DIAGNOSIS — Z720 Tobacco use: Secondary | ICD-10-CM

## 2014-01-30 DIAGNOSIS — J4520 Mild intermittent asthma, uncomplicated: Secondary | ICD-10-CM

## 2014-01-30 DIAGNOSIS — Z3480 Encounter for supervision of other normal pregnancy, unspecified trimester: Secondary | ICD-10-CM

## 2014-01-30 DIAGNOSIS — J45909 Unspecified asthma, uncomplicated: Secondary | ICD-10-CM

## 2014-01-30 DIAGNOSIS — F17200 Nicotine dependence, unspecified, uncomplicated: Secondary | ICD-10-CM

## 2014-01-30 LAB — POCT URINALYSIS DIP (DEVICE)
Bilirubin Urine: NEGATIVE
Glucose, UA: NEGATIVE mg/dL
Hgb urine dipstick: NEGATIVE
KETONES UR: NEGATIVE mg/dL
Leukocytes, UA: NEGATIVE
Nitrite: NEGATIVE
PROTEIN: NEGATIVE mg/dL
Urobilinogen, UA: 0.2 mg/dL (ref 0.0–1.0)
pH: 7 (ref 5.0–8.0)

## 2014-01-30 MED ORDER — ALBUTEROL SULFATE HFA 108 (90 BASE) MCG/ACT IN AERS: 1.0000 | INHALATION_SPRAY | Freq: Four times a day (QID) | RESPIRATORY_TRACT | Status: DC | PRN

## 2014-01-30 MED ORDER — NICOTINE 14 MG/24HR TD PT24: 14.0000 mg | MEDICATED_PATCH | Freq: Every day | TRANSDERMAL | Status: DC

## 2014-01-30 NOTE — Progress Notes (Signed)
Pt seen for first OB visit today.  Obese pregravid but wt gain is wnl today.  C/o severe N/V.  Eating 1x/day.  Drinking water and sweet tea mostly.  Taking PNV and albuterol daily.  NKFA.  Discussed wt gain goal of 11-20# overall.  Encouraged client to eat small meals/snacks q2-3 hrs to help ease nausea.  [facility]  Provided written and verbal education on healthy eating during pregnancy.  F/u if referred.   Melanee LeftErin Cashwell, MPH RD LDN

## 2014-01-30 NOTE — Addendum Note (Signed)
Addended by: Vale HavenBECK, Veronia Laprise L on: 01/30/2014 12:26 PM   Modules accepted: Orders

## 2014-01-30 NOTE — Progress Notes (Signed)
  Subjective:    Mary Mitchell is a [id number] being seen today for her first obstetrical visit.  Her obstetrical history is significant for smoker. Patient does intend to breast feed. Pregnancy history fully reviewed.  Patient reports nasal congestion and nasal irritation.  has been sick for a week. Also having headaches during her pregnancy- throbbing pain in the front of her head. Taking tylenol extra strength- usually knocks it out but that isn't helping anymore.   Filed Vitals:   01/30/14 0913 01/30/14 0915  BP: 127/82   Pulse: 99   Temp: 98.1 F (36.7 C)   Height:  5\' 3"  (1.6 m)  Weight: 84.324 kg (185 lb 14.4 oz)     HISTORY: OB History  Gravida Para Term Preterm AB SAB TAB Ectopic Multiple Living  4 3 3  0 0 0 0 0 0 3    # Outcome Date GA Lbr Len/2nd Weight Sex Delivery Anes PTL Lv  4 CUR           3 TRM 03/12/13 47101w0d  2.977 kg (6 lb 9 oz) F SVD EPI  Y     Comments: baby came out blue, "was stuck for a while, low oxygen to brain"   2 TRM 03/08/11 6544w0d  2.381 kg (5 lb 4 oz) F SVD EPI  Y  1 TRM 04/07/10 1719w0d  2.807 kg (6 lb 3 oz) F SVD EPI  Y     Past Medical History  Diagnosis Date  . Asthma    Past Surgical History  Procedure Laterality Date  . Abdominal hernia repair     Family History  Problem Relation Age of Onset  . Diabetes Mother   . Hypertension Mother   . Diabetes Maternal Grandmother   . Hypertension Maternal Grandmother   . Heart disease Maternal Grandmother      Exam    Uterus:   size appropriate for dates.   Pelvic Exam: Deferred    Skin: normal coloration and turgor, no rashes    Neurologic: normal   Extremities: normal strength, tone, and muscle mass   HEENT PERRLA and extra ocular movement intact   Mouth/Teeth mucous membranes moist, pharynx normal without lesions   Neck supple   Cardiovascular: regular rate and rhythm   Respiratory:  appears well, vitals normal, no respiratory distress, acyanotic, normal RR, ear and  throat exam is normal, neck free of mass or lymphadenopathy, chest clear, no wheezing, crepitations, rhonchi, normal symmetric air entry   Abdomen: soft, non-tender; bowel sounds normal; no masses,  no organomegaly   Urinary: urethral meatus normal      Assessment:    Pregnancy: [id number] Patient Active Problem List   Diagnosis Date Noted  . Supervision of normal subsequent pregnancy 01/24/2014        Plan:     Initial labs drawn. Prenatal vitamins. Problem list reviewed and updated. Genetic Screening discussed too late  Ultrasound discussed; fetal survey: results reviewed.  Follow up in 4 weeks. 50% of 30 min visit spent on counseling and coordination of care.     Tzvi Economou L Binnie Vonderhaar 01/30/2014

## 2014-01-30 NOTE — Patient Instructions (Signed)
Second Trimester of Pregnancy The second trimester is from week 13 through week 28, months 4 through 6. The second trimester is often a time when you feel your best. Your body has also adjusted to being pregnant, and you begin to feel better physically. Usually, morning sickness has lessened or quit completely, you may have more energy, and you may have an increase in appetite. The second trimester is also a time when the fetus is growing rapidly. At the end of the sixth month, the fetus is about 9 inches long and weighs about 1 pounds. You will likely begin to feel the baby move (quickening) between 18 and 20 weeks of the pregnancy. BODY CHANGES Your body goes through many changes during pregnancy. The changes vary from woman to woman.   Your weight will continue to increase. You will notice your lower abdomen bulging out.  You may begin to get stretch marks on your hips, abdomen, and breasts.  You may develop headaches that can be relieved by medicines approved by your caregiver.  You may urinate more often because the fetus is pressing on your bladder.  You may develop or continue to have heartburn as a result of your pregnancy.  You may develop constipation because certain hormones are causing the muscles that push waste through your intestines to slow down.  You may develop hemorrhoids or swollen, bulging veins (varicose veins).  You may have back pain because of the weight gain and pregnancy hormones relaxing your joints between the bones in your pelvis and as a result of a shift in weight and the muscles that support your balance.  Your breasts will continue to grow and be tender.  Your gums may bleed and may be sensitive to brushing and flossing.  Dark spots or blotches (chloasma, mask of pregnancy) may develop on your face. This will likely fade after the baby is born.  A dark line from your belly button to the pubic area (linea nigra) may appear. This will likely fade after the  baby is born. WHAT TO EXPECT AT YOUR PRENATAL VISITS During a routine prenatal visit:  You will be weighed to make sure you and the fetus are growing normally.  Your blood pressure will be taken.  Your abdomen will be measured to track your baby's growth.  The fetal heartbeat will be listened to.  Any test results from the previous visit will be discussed. Your caregiver may ask you:  How you are feeling.  If you are feeling the baby move.  If you have had any abnormal symptoms, such as leaking fluid, bleeding, severe headaches, or abdominal cramping.  If you have any questions. Other tests that may be performed during your second trimester include:  Blood tests that check for:  Low iron levels (anemia).  Gestational diabetes (between 24 and 28 weeks).  Rh antibodies.  Urine tests to check for infections, diabetes, or protein in the urine.  An ultrasound to confirm the proper growth and development of the baby.  An amniocentesis to check for possible genetic problems.  Fetal screens for spina bifida and Down syndrome. HOME CARE INSTRUCTIONS   Avoid all smoking, herbs, alcohol, and unprescribed drugs. These chemicals affect the formation and growth of the baby.  Follow your caregiver's instructions regarding medicine use. There are medicines that are either safe or unsafe to take during pregnancy.  Exercise only as directed by your caregiver. Experiencing uterine cramps is a good sign to stop exercising.  Continue to eat regular,   healthy meals.  Wear a good support bra for breast tenderness.  Do not use hot tubs, steam rooms, or saunas.  Wear your seat belt at all times when driving.  Avoid raw meat, uncooked cheese, cat litter boxes, and soil used by cats. These carry germs that can cause birth defects in the baby.  Take your prenatal vitamins.  Try taking a stool softener (if your caregiver approves) if you develop constipation. Eat more high-fiber foods,  such as fresh vegetables or fruit and whole grains. Drink plenty of fluids to keep your urine clear or pale yellow.  Take warm sitz baths to soothe any pain or discomfort caused by hemorrhoids. Use hemorrhoid cream if your caregiver approves.  If you develop varicose veins, wear support hose. Elevate your feet for 15 minutes, 3 4 times a day. Limit salt in your diet.  Avoid heavy lifting, wear low heel shoes, and practice good posture.  Rest with your legs elevated if you have leg cramps or low back pain.  Visit your dentist if you have not gone yet during your pregnancy. Use a soft toothbrush to brush your teeth and be gentle when you floss.  A sexual relationship may be continued unless your caregiver directs you otherwise.  Continue to go to all your prenatal visits as directed by your caregiver. SEEK MEDICAL CARE IF:   You have dizziness.  You have mild pelvic cramps, pelvic pressure, or nagging pain in the abdominal area.  You have persistent nausea, vomiting, or diarrhea.  You have a bad smelling vaginal discharge.  You have pain with urination. SEEK IMMEDIATE MEDICAL CARE IF:   You have a fever.  You are leaking fluid from your vagina.  You have spotting or bleeding from your vagina.  You have severe abdominal cramping or pain.  You have rapid weight gain or loss.  You have shortness of breath with chest pain.  You notice sudden or extreme swelling of your face, hands, ankles, feet, or legs.  You have not felt your baby move in over an hour.  You have severe headaches that do not go away with medicine.  You have vision changes. Document Released: 08/30/2001 Document Revised: 05/08/2013 Document Reviewed: 11/06/2012 Exit[employer] Patient Information 2014 [employer].  

## 2014-01-30 NOTE — Progress Notes (Signed)
Extensive smoking hx. rx of nicotine patches. counseled on fetal risks.

## 2014-01-31 ENCOUNTER — Encounter: Payer: Self-pay | Admitting: Family Medicine

## 2014-01-31 DIAGNOSIS — O26899 Other specified pregnancy related conditions, unspecified trimester: Secondary | ICD-10-CM

## 2014-01-31 DIAGNOSIS — Z6791 Unspecified blood type, Rh negative: Secondary | ICD-10-CM | POA: Insufficient documentation

## 2014-01-31 LAB — ANTIBODY TITER (PRENATAL TITER)

## 2014-01-31 LAB — OBSTETRIC PANEL
Antibody Screen: POSITIVE — AB
BASOS ABS: 0 10*3/uL (ref 0.0–0.1)
BASOS PCT: 0 % (ref 0–1)
Eosinophils Absolute: 0.1 10*3/uL (ref 0.0–0.7)
Eosinophils Relative: 1 % (ref 0–5)
HCT: 31.4 % — ABNORMAL LOW (ref 36.0–46.0)
Hemoglobin: 10.6 g/dL — ABNORMAL LOW (ref 12.0–15.0)
Hepatitis B Surface Ag: NEGATIVE
Lymphocytes Relative: 28 % (ref 12–46)
Lymphs Abs: 1.9 10*3/uL (ref 0.7–4.0)
MCH: 26.2 pg (ref 26.0–34.0)
MCHC: 33.8 g/dL (ref 30.0–36.0)
MCV: 77.5 fL — ABNORMAL LOW (ref 78.0–100.0)
MONOS PCT: 6 % (ref 3–12)
Monocytes Absolute: 0.4 10*3/uL (ref 0.1–1.0)
NEUTROS ABS: 4.4 10*3/uL (ref 1.7–7.7)
Neutrophils Relative %: 65 % (ref 43–77)
Platelets: 265 10*3/uL (ref 150–400)
RBC: 4.05 MIL/uL (ref 3.87–5.11)
RDW: 14.1 % (ref 11.5–15.5)
Rh Type: NEGATIVE
Rubella: 1.9 Index — ABNORMAL HIGH (ref <0.90)
WBC: 6.7 10*3/uL (ref 4.0–10.5)

## 2014-01-31 LAB — PRENATAL ANTIBODY IDENTIFICATION

## 2014-01-31 LAB — HIV ANTIBODY (ROUTINE TESTING W REFLEX): HIV 1&2 Ab, 4th Generation: NONREACTIVE

## 2014-01-31 LAB — GLUCOSE TOLERANCE, 1 HOUR (50G) W/O FASTING: GLUCOSE 1 HOUR GTT: 86 mg/dL (ref 70–140)

## 2014-02-01 ENCOUNTER — Encounter: Payer: Self-pay | Admitting: *Deleted

## 2014-02-01 LAB — PRESCRIPTION MONITORING PROFILE (19 PANEL)
Amphetamine/Meth: NEGATIVE ng/mL
BARBITURATE SCREEN, URINE: NEGATIVE ng/mL
BENZODIAZEPINE SCREEN, URINE: NEGATIVE ng/mL
Buprenorphine, Urine: NEGATIVE ng/mL
CANNABINOID SCRN UR: NEGATIVE ng/mL
COCAINE METABOLITES: NEGATIVE ng/mL
Carisoprodol, Urine: NEGATIVE ng/mL
Creatinine, Urine: 172.5 mg/dL (ref >20.0)
FENTANYL URINE: NEGATIVE ng/mL
MDMA URINE: NEGATIVE ng/mL
Meperidine, Ur: NEGATIVE ng/mL
Methadone Screen, Urine: NEGATIVE ng/mL
Methaqualone: NEGATIVE ng/mL
Nitrites, Initial: NEGATIVE ug/mL
OPIATE SCREEN, URINE: NEGATIVE ng/mL
Oxycodone Screen, Ur: NEGATIVE ng/mL
Phencyclidine, Ur: NEGATIVE ng/mL
Propoxyphene: NEGATIVE ng/mL
Tapentadol, urine: NEGATIVE ng/mL
Tramadol Scrn, Ur: NEGATIVE ng/mL
Zolpidem, Urine: NEGATIVE ng/mL
pH, Initial: 7.3 pH (ref 4.5–8.9)

## 2014-02-03 LAB — HEMOGLOBINOPATHY EVALUATION
HEMOGLOBIN OTHER: 0 %
HGB A2 QUANT: 2.4 % (ref 2.2–3.2)
HGB F QUANT: 0 % (ref 0.0–2.0)
Hgb A: 97.6 % (ref 96.8–97.8)
Hgb S Quant: 0 %

## 2014-02-05 ENCOUNTER — Telehealth: Payer: Self-pay

## 2014-02-05 DIAGNOSIS — N390 Urinary tract infection, site not specified: Secondary | ICD-10-CM

## 2014-02-05 LAB — CULTURE, OB URINE: Colony Count: >=100000

## 2014-02-05 MED ORDER — CEPHALEXIN 500 MG PO CAPS: 500.0000 mg | ORAL_CAPSULE | Freq: Four times a day (QID) | ORAL | Status: DC

## 2014-02-05 NOTE — Telephone Encounter (Signed)
Message copied by Louanna RawAMPBELL, Jennise Both M on Wed Feb 05, 2014  4:39 PM ------      Message from: Vale HavenBECK, KELI L      Created: Wed Feb 05, 2014  2:37 PM       Urine cx with >100,000 colonies. Can we treat her with keflex x 5 days? ------

## 2014-02-05 NOTE — Telephone Encounter (Signed)
Keflex 500mg  QID X 5 days prescribed. Called pt. And informed of results and prescription--- patient verbalized understanding. No questions or concerns.

## 2014-02-27 ENCOUNTER — Encounter: Payer: Medicaid Other | Admitting: Family Medicine

## 2014-03-05 ENCOUNTER — Inpatient Hospital Stay (HOSPITAL_COMMUNITY)
Admission: AD | Admit: 2014-03-05 | Discharge: 2014-03-05 | Disposition: A | Discharge status: Home / Self Care | Payer: Medicaid Other | Source: Ambulatory Visit | Attending: Obstetrics and Gynecology | Admitting: Obstetrics and Gynecology

## 2014-03-05 ENCOUNTER — Encounter (HOSPITAL_COMMUNITY): Payer: Self-pay | Admitting: *Deleted

## 2014-03-05 DIAGNOSIS — O99330 Smoking (tobacco) complicating pregnancy, unspecified trimester: Secondary | ICD-10-CM | POA: Insufficient documentation

## 2014-03-05 DIAGNOSIS — O360990 Maternal care for other rhesus isoimmunization, unspecified trimester, not applicable or unspecified: Secondary | ICD-10-CM

## 2014-03-05 DIAGNOSIS — N949 Unspecified condition associated with female genital organs and menstrual cycle: Secondary | ICD-10-CM

## 2014-03-05 DIAGNOSIS — O26899 Other specified pregnancy related conditions, unspecified trimester: Secondary | ICD-10-CM

## 2014-03-05 DIAGNOSIS — Z3480 Encounter for supervision of other normal pregnancy, unspecified trimester: Secondary | ICD-10-CM

## 2014-03-05 DIAGNOSIS — Z6791 Unspecified blood type, Rh negative: Secondary | ICD-10-CM

## 2014-03-05 DIAGNOSIS — O99891 Other specified diseases and conditions complicating pregnancy: Secondary | ICD-10-CM | POA: Insufficient documentation

## 2014-03-05 DIAGNOSIS — R109 Unspecified abdominal pain: Secondary | ICD-10-CM | POA: Insufficient documentation

## 2014-03-05 DIAGNOSIS — Z720 Tobacco use: Secondary | ICD-10-CM

## 2014-03-05 DIAGNOSIS — O9989 Other specified diseases and conditions complicating pregnancy, childbirth and the puerperium: Principal | ICD-10-CM

## 2014-03-05 LAB — URINALYSIS, ROUTINE W REFLEX MICROSCOPIC
Bilirubin Urine: NEGATIVE
Glucose, UA: NEGATIVE mg/dL
Hgb urine dipstick: NEGATIVE
Ketones, ur: 15 mg/dL — AB
LEUKOCYTES UA: NEGATIVE
NITRITE: NEGATIVE
PROTEIN: NEGATIVE mg/dL
Specific Gravity, Urine: >1.03 — ABNORMAL HIGH (ref 1.005–1.030)
Urobilinogen, UA: 1 mg/dL (ref 0.0–1.0)
pH: 6 (ref 5.0–8.0)

## 2014-03-05 NOTE — MAU Provider Note (Signed)
History     CSN: [id number]  Arrival date and time: 03/05/14 1953   None     Chief Complaint  Patient presents with  . Abdominal Cramping   HPI Ms Mary Mitchell is a 17yo [id number] @ 29.0wks presenting for eval of abd cramping; denies bldg or leaking; reports sm white vag d/c. No dysuria, +FM. [facility] and has been remarkable for 1) late onset of care @ 22wks with only 2 visits so far (none since 5/14) 2) smoker.   OB History   Grav Para Term Preterm Abortions TAB SAB Ect Mult Living   4 3 3  0 0 0 0 0 0 3      Past Medical History  Diagnosis Date  . Asthma     Past Surgical History  Procedure Laterality Date  . Abdominal hernia repair      Family History  Problem Relation Age of Onset  . Diabetes Mother   . Hypertension Mother   . Diabetes Maternal Grandmother   . Hypertension Maternal Grandmother   . Heart disease Maternal Grandmother     History  Substance Use Topics  . Smoking status: Former Smoker -- 0.25 packs/day    Types: Cigarettes  . Smokeless tobacco: Not on file  . Alcohol Use: No    Allergies: No Known Allergies  Prescriptions prior to admission  Medication Sig Dispense Refill  . acetaminophen (TYLENOL) 500 MG tablet Take 1,000 mg by mouth every 6 (six) hours as needed for headache.      . albuterol (PROVENTIL HFA;VENTOLIN HFA) 108 (90 BASE) MCG/ACT inhaler Inhale 1-2 puffs into the lungs every 6 (six) hours as needed for wheezing or shortness of breath.  1 Inhaler  3  . cephALEXin (KEFLEX) 500 MG capsule Take 1 capsule (500 mg total) by mouth 4 (four) times daily.  20 capsule  0  . Prenatal Vit-Fe Fumarate-FA (PRENATAL MULTIVITAMIN) TABS tablet Take 1 tablet by mouth daily at 12 noon.        ROS Physical Exam   Blood pressure 127/60, pulse 109, temperature 99 F (37.2 C), temperature source Oral, resp. rate 18, height 5' 1.3" (1.557 m), weight 85.095 kg (187 lb 9.6 oz), last menstrual period 08/14/2013, SpO2  100.00%.  Physical Exam  Constitutional: She is oriented to person, place, and time. She appears well-developed.  HENT:  Head: Normocephalic.  Neck: Normal range of motion.  Cardiovascular:  Sl tachy  Respiratory: Effort normal.  GI:  EFM 120s, +accels, no decels No ctx per toco  Genitourinary: Vagina normal.  Cx C/L  Musculoskeletal: Normal range of motion.  Neurological: She is alert and oriented to person, place, and time.  Skin: Skin is warm and dry.  Psychiatric: She has a normal mood and affect. Her behavior is normal. Thought content normal.   Urinalysis    Component Value Date/Time   COLORURINE YELLOW 03/05/2014 2114   APPEARANCEUR CLEAR 03/05/2014 2114   LABSPEC >1.030* 03/05/2014 2114   PHURINE 6.0 03/05/2014 2114   GLUCOSEU NEGATIVE 03/05/2014 2114   HGBUR NEGATIVE 03/05/2014 2114   BILIRUBINUR NEGATIVE 03/05/2014 2114   KETONESUR 15* 03/05/2014 2114   PROTEINUR NEGATIVE 03/05/2014 2114   UROBILINOGEN 1.0 03/05/2014 2114   NITRITE NEGATIVE 03/05/2014 2114   LEUKOCYTESUR NEGATIVE 03/05/2014 2114      MAU Course  Procedures    Assessment and Plan  IUP @ 29.0wks Round lig pain  D/C home with preterm labor precautions Will notify pt with abnl  urine results (unable to stay for results due to childcare) F/U as scheduled on June 25th or sooner prn Declines wet prep tonight- will notify if still concerned re vag d/c at her next visit  Cam HaiSHAW, KIMBERLY CNM 03/05/2014, 9:53 PM

## 2014-03-05 NOTE — Discharge Instructions (Signed)

## 2014-03-05 NOTE — MAU Note (Signed)
Pt. Having cramping in her lower abdomen since this am. Reports it as a sharp pain that comes and goes. Has been drinking tea today and no water. Denies bleeding. Does report a milky discharge that began a few weeks ago in the past week has noted a smell. Denies any leakage of fluid. Baby is moving well.

## 2014-03-05 NOTE — MAU Note (Signed)
Pt c/o cramping that started today while at work. Has not drank much today. Has only had 3 cups of tea. Denies vaginal bleeding but has a milky discharge. +FM. Denies urinary symptoms

## 2014-03-10 NOTE — MAU Provider Note (Signed)
Attestation of Attending Supervision of Advanced Practitioner: Evaluation and management procedures were performed by the PA/NP/CNM/OB Fellow under my supervision/collaboration. Chart reviewed and agree with management and plan.  FERGUSON,JOHN V 03/10/2014 3:10 AM

## 2014-03-13 ENCOUNTER — Encounter: Payer: Medicaid Other | Admitting: Family

## 2014-03-31 ENCOUNTER — Inpatient Hospital Stay (HOSPITAL_COMMUNITY)
Admission: AD | Admit: 2014-03-31 | Discharge: 2014-03-31 | Disposition: A | Discharge status: Home / Self Care | Payer: Medicaid Other | Source: Ambulatory Visit | Attending: Family Medicine | Admitting: Family Medicine

## 2014-03-31 ENCOUNTER — Encounter (HOSPITAL_COMMUNITY): Payer: Self-pay | Admitting: *Deleted

## 2014-03-31 DIAGNOSIS — R42 Dizziness and giddiness: Secondary | ICD-10-CM | POA: Diagnosis not present

## 2014-03-31 DIAGNOSIS — Z8249 Family history of ischemic heart disease and other diseases of the circulatory system: Secondary | ICD-10-CM | POA: Insufficient documentation

## 2014-03-31 DIAGNOSIS — J329 Chronic sinusitis, unspecified: Secondary | ICD-10-CM | POA: Insufficient documentation

## 2014-03-31 DIAGNOSIS — O99330 Smoking (tobacco) complicating pregnancy, unspecified trimester: Secondary | ICD-10-CM | POA: Insufficient documentation

## 2014-03-31 DIAGNOSIS — R197 Diarrhea, unspecified: Secondary | ICD-10-CM | POA: Insufficient documentation

## 2014-03-31 DIAGNOSIS — Z833 Family history of diabetes mellitus: Secondary | ICD-10-CM | POA: Insufficient documentation

## 2014-03-31 DIAGNOSIS — B9789 Other viral agents as the cause of diseases classified elsewhere: Secondary | ICD-10-CM

## 2014-03-31 DIAGNOSIS — O9989 Other specified diseases and conditions complicating pregnancy, childbirth and the puerperium: Principal | ICD-10-CM

## 2014-03-31 DIAGNOSIS — O212 Late vomiting of pregnancy: Secondary | ICD-10-CM | POA: Insufficient documentation

## 2014-03-31 DIAGNOSIS — O99891 Other specified diseases and conditions complicating pregnancy: Secondary | ICD-10-CM | POA: Insufficient documentation

## 2014-03-31 MED ORDER — DM-GUAIFENESIN ER 30-600 MG PO TB12: 1.0000 | ORAL_TABLET | Freq: Two times a day (BID) | ORAL | Status: DC

## 2014-03-31 MED ORDER — PSEUDOEPHEDRINE HCL 30 MG/5ML PO SYRP: 15.0000 mg | ORAL_SOLUTION | Freq: Four times a day (QID) | ORAL | Status: DC | PRN

## 2014-03-31 NOTE — Discharge Instructions (Signed)
Viral Pharyngitis Viral pharyngitis is a viral infection that produces redness, pain, and swelling (inflammation) of the throat. It can spread from person to person (contagious). CAUSES Viral pharyngitis is caused by inhaling a large amount of certain germs called viruses. Many different viruses cause viral pharyngitis. SYMPTOMS Symptoms of viral pharyngitis include: Sore throat.Viral Infections A viral infection can be caused by different types of viruses.Most viral infections are not serious and resolve on their own. However, some infections may cause severe symptoms and may lead to further complications. SYMPTOMS Viruses can frequently cause:  Minor sore throat.  Aches and pains.  Headaches.  Runny nose.  Different types of rashes.  Watery eyes.  Tiredness.  Cough.  Loss of appetite.  Gastrointestinal infections, resulting in nausea, vomiting, and diarrhea. These symptoms do not respond to antibiotics because the infection is not caused by bacteria. However, you might catch a bacterial infection following the viral infection. This is sometimes called a "superinfection." Symptoms of such a bacterial infection may include:  Worsening sore throat with pus and difficulty swallowing.  Swollen neck glands.  Chills and a high or persistent fever.  Severe headache.  Tenderness over the sinuses.  Persistent overall ill feeling (malaise), muscle aches, and tiredness (fatigue).  Persistent cough.  Yellow, green, or brown mucus production with coughing. HOME CARE INSTRUCTIONS   Only take over-the-counter or prescription medicines for pain, discomfort, diarrhea, or fever as directed by your caregiver.  Drink enough water and fluids to keep your urine clear or pale yellow. Sports drinks can provide valuable electrolytes, sugars, and hydration.  Get plenty of rest and maintain proper nutrition. Soups and broths with crackers or rice are fine. SEEK IMMEDIATE MEDICAL CARE  IF:   You have severe headaches, shortness of breath, chest pain, neck pain, or an unusual rash.  You have uncontrolled vomiting, diarrhea, or you are unable to keep down fluids.  You or your child has an oral temperature above 102 F (38.9 C), not controlled by medicine.  Your baby is older than 3 months with a rectal temperature of 102 F (38.9 C) or higher.  Your baby is 49 months old or younger with a rectal temperature of 100.4 F (38 C) or higher. MAKE SURE YOU:   Understand these instructions.  Will watch your condition.  Will get help right away if you are not doing well or get worse. Document Released: 06/15/2005 Document Revised: 11/28/2011 Document Reviewed: 01/10/2011 [facility] Patient Information 2015 [place], [place]. This information is not intended to replace advice given to you by your health care provider. Make sure you discuss any questions you have with your health care provider.    Tiredness.  Stuffy nose.  Low-grade fever.  Congestion.  Cough. TREATMENT Treatment includes rest, drinking plenty of fluids, and the use of over-the-counter medication (approved by your caregiver). HOME CARE INSTRUCTIONS   Drink enough fluids to keep your urine clear or pale yellow.  Eat soft, cold foods such as ice cream, frozen ice pops, or gelatin dessert.  Gargle with warm salt water (1 tsp salt per 1 qt of water).  If over age 25, throat lozenges may be used safely.  Only take over-the-counter or prescription medicines for pain, discomfort, or fever as directed by your caregiver. Do not take aspirin. To help prevent spreading viral pharyngitis to others, avoid:  Mouth-to-mouth contact with others.  Sharing utensils for eating and drinking.  Coughing around others. SEEK MEDICAL CARE IF:   You are better in a few  days, then become worse.  You have a fever or pain not helped by pain medicines.  There are any other changes that concern you. Document Released:  06/15/2005 Document Revised: 11/28/2011 Document Reviewed: 11/11/2010 [facility]ExitCare Patient Information 2015 [place]. This information is not intended to replace advice given to you by your health care provider. Make sure you discuss any questions you have with your health care provider.

## 2014-03-31 NOTE — MAU Note (Signed)
Pt states she started feeling bad on Sunday around 2000.  Pt significant other recently had the flu.  Pt c/o fever, dizziness, vomiting starting on Sunday. Today she feels light headed and sweaty.  Good fetal movement.  Denies ROM or vaginal bleeding.

## 2014-03-31 NOTE — MAU Provider Note (Signed)
First Provider Initiated Contact with Patient 03/31/14 2041      Chief Complaint:  Emesis, Fever, Diarrhea and Dizziness   Mary Mitchell is  18 y.o. [id number] at [redacted]w[redacted]d presents complaining of Emesis, Fever, Diarrhea and Dizziness .  She states none contractions are associated with none vaginal bleeding, intact membranes, along with active fetal movement. Pt. Is here with feeling sick since Sunday 7/12. She says that her boyfriend had the flu from which he recovered as of 7/10. She says that last night she began to feel sick, and has since vomited 4-5 times and had diarrhea x2. She says that she has congestion, mild drainage, and some retroorbital sinus pressure. She complains of subjective fevers. She has had headaches. She has taken tylenol for the fever and body aches. She is able to tolerate po at home, and states that she has been drinking water, and eating some light meals. She denies bleeding, LOF, or contractions. She denies SCVA tenderness, dysuria, polyuria, production of sputum, or nasal discharge. She has no other complaints.   Obstetrical/Gynecological History: OB History   Grav Para Term Preterm Abortions TAB SAB Ect Mult Living   4 3 3  0 0 0 0 0 0 3     Past Medical History: Past Medical History  Diagnosis Date  . Asthma     Past Surgical History: Past Surgical History  Procedure Laterality Date  . Abdominal hernia repair      Family History: Family History  Problem Relation Age of Onset  . Diabetes Mother   . Hypertension Mother   . Diabetes Maternal Grandmother   . Hypertension Maternal Grandmother   . Heart disease Maternal Grandmother     Social History: History  Substance Use Topics  . Smoking status: Current Some Day Smoker -- 0.25 packs/day    Types: Cigarettes  . Smokeless tobacco: Not on file  . Alcohol Use: No    Allergies: No Known Allergies  Meds:  Prescriptions prior to admission  Medication Sig Dispense Refill  . acetaminophen  (TYLENOL) 500 MG tablet Take 1,000 mg by mouth every 6 (six) hours as needed for headache.      . albuterol (PROVENTIL HFA;VENTOLIN HFA) 108 (90 BASE) MCG/ACT inhaler Inhale 1-2 puffs into the lungs every 6 (six) hours as needed for wheezing or shortness of breath.  1 Inhaler  3  . Prenatal Vit-Fe Fumarate-FA (PRENATAL MULTIVITAMIN) TABS tablet Take 1 tablet by mouth daily at 12 noon.        Review of Systems -   Per HPI     Physical Exam  Blood pressure 119/67, pulse 126, temperature 98.4 F (36.9 C), temperature source Oral, resp. rate 18, height 5\' 3"  (1.6 m), weight 82.101 kg (181 lb), last menstrual period 08/14/2013. GENERAL: Well-developed, well-nourished female in no acute distress.  LUNGS: Clear to auscultation bilaterally.  HEART: Regular rate and rhythm. ABDOMEN: Soft, nontender, nondistended, gravid.  EXTREMITIES: Nontender, no edema, 2+ distal pulses. Neurologically fully intact CERVICAL EXAM: deferred at this time.   Presentation: deferred FHT:  Baseline rate 141 bpm   Variability moderate  Accelerations present   Decelerations none Contractions: none   Labs: No results found for this or any previous visit (from the past 24 hour(s)). Imaging Studies:  No results found.  Assessment: Mary Mitchell is  18 y.o. [phone] at [redacted]w[redacted]d presents with Viral sinusitis.  Plan: 1. Viral Sinusitis - Pseudafedrine, Mucinex, for symptom relief - Encouraged patient with PO hydration, and light meals of  broth or other light foods.  - Symptom relief at this time, advised patient to return to seek care if symptoms worsen, or if she is unable to tolerate po intake, or if symptoms continue for a prolonged period.   2. IUP [id number] @ [redacted]W[redacted]D - Reactive NST at this visit.  - No LOF, VB, +FM at this time.  - Advised patient [facility]   Yolande JollyMelancon, Caleb G 7/13/20159:05 PM  Seen also by me Agree with note Aviva SignsMarie L Brown Dunlap, CNM

## 2014-04-02 NOTE — MAU Provider Note (Signed)
Attestation of Attending Supervision of Advanced Practitioner (PA/CNM/NP): Evaluation and management procedures were performed by the Advanced Practitioner under my supervision and collaboration.  I have reviewed the Advanced Practitioner's note and chart, and I agree with the management and plan.  Brei Pociask S, MD [facility] Attending 04/02/2014 10:17 AM   

## 2014-04-14 ENCOUNTER — Encounter (HOSPITAL_COMMUNITY): Payer: Self-pay | Admitting: *Deleted

## 2014-04-14 ENCOUNTER — Inpatient Hospital Stay (HOSPITAL_COMMUNITY)
Admission: AD | Admit: 2014-04-14 | Discharge: 2014-04-14 | Disposition: A | Discharge status: Home / Self Care | Payer: Medicaid Other | Source: Ambulatory Visit | Attending: Obstetrics & Gynecology | Admitting: Obstetrics & Gynecology

## 2014-04-14 ENCOUNTER — Inpatient Hospital Stay (EMERGENCY_DEPARTMENT_HOSPITAL)
Admission: AD | Admit: 2014-04-14 | Discharge: 2014-04-14 | Disposition: A | Discharge status: Home / Self Care | Payer: Medicaid Other | Source: Ambulatory Visit | Attending: Obstetrics & Gynecology | Admitting: Obstetrics & Gynecology

## 2014-04-14 DIAGNOSIS — N898 Other specified noninflammatory disorders of vagina: Secondary | ICD-10-CM

## 2014-04-14 DIAGNOSIS — O4700 False labor before 37 completed weeks of gestation, unspecified trimester: Secondary | ICD-10-CM | POA: Insufficient documentation

## 2014-04-14 DIAGNOSIS — O99330 Smoking (tobacco) complicating pregnancy, unspecified trimester: Secondary | ICD-10-CM | POA: Insufficient documentation

## 2014-04-14 DIAGNOSIS — O99891 Other specified diseases and conditions complicating pregnancy: Secondary | ICD-10-CM | POA: Insufficient documentation

## 2014-04-14 DIAGNOSIS — O9989 Other specified diseases and conditions complicating pregnancy, childbirth and the puerperium: Secondary | ICD-10-CM

## 2014-04-14 DIAGNOSIS — O479 False labor, unspecified: Secondary | ICD-10-CM

## 2014-04-14 MED ORDER — FENTANYL CITRATE 0.05 MG/ML IJ SOLN
50.0000 ug | Freq: Once | INTRAMUSCULAR | Status: AC
Start: 2014-04-14 — End: 2014-04-14
  Administered 2014-04-14: 50 ug via INTRAMUSCULAR
  Filled 2014-04-14: qty 2

## 2014-04-14 NOTE — MAU Provider Note (Signed)
First Provider Initiated Contact with Patient 04/14/14 0121      Chief Complaint:  Rupture of Membranes   Mary Mitchell is  18 y.o. [id number] at 4280w5d presents complaining of Rupture of Membranes .  She states none contractions are associated with none vaginal bleeding, intact membranes, along with decreased  fetal movement. Pt. Reports "wet" fluid and vaginal discharge this evening around 2230. She says that she did have sexual intercourse just prior to that episode. She denies VB, Contractions. She says that she has felt some fetal movement today, but less than normal. She has not had any more LOF after the initial episode. She denies fever, chills, nausea, vomiting, back pain, or abdominal pain. She denies vaginal bleeding. She has no other complaints.   Obstetrical/Gynecological History: OB History   Grav Para Term Preterm Abortions TAB SAB Ect Mult Living   4 3 3  0 0 0 0 0 0 3     Past Medical History: Past Medical History  Diagnosis Date  . Asthma     Past Surgical History: Past Surgical History  Procedure Laterality Date  . Abdominal hernia repair      Family History: Family History  Problem Relation Age of Onset  . Diabetes Mother   . Hypertension Mother   . Diabetes Maternal Grandmother   . Hypertension Maternal Grandmother   . Heart disease Maternal Grandmother     Social History: History  Substance Use Topics  . Smoking status: Current Some Day Smoker -- 0.25 packs/day    Types: Cigarettes  . Smokeless tobacco: Not on file  . Alcohol Use: No    Allergies: No Known Allergies  Meds:  Prescriptions prior to admission  Medication Sig Dispense Refill  . Prenatal Vit-Fe Fumarate-FA (PRENATAL MULTIVITAMIN) TABS tablet Take 1 tablet by mouth daily at 12 noon.      Marland Kitchen. acetaminophen (TYLENOL) 500 MG tablet Take 1,000 mg by mouth every 6 (six) hours as needed for headache.      . albuterol (PROVENTIL HFA;VENTOLIN HFA) 108 (90 BASE) MCG/ACT inhaler Inhale  1-2 puffs into the lungs every 6 (six) hours as needed for wheezing or shortness of breath.  1 Inhaler  3  . dextromethorphan-guaiFENesin (MUCINEX DM) 30-600 MG per 12 hr tablet Take 1 tablet by mouth 2 (two) times daily.  30 tablet  0  . pseudoephedrine (SUDAFED) 30 MG/5ML syrup Take 2.5 mLs (15 mg total) by mouth 4 (four) times daily as needed for congestion.  118 mL  0    Review of Systems -  Per HPI   Physical Exam  Blood pressure 120/59, pulse 108, temperature 98.8 F (37.1 C), resp. rate 18, height 5\' 3"  (1.6 m), weight 82.101 kg (181 lb), last menstrual period 08/14/2013. GENERAL: Well-developed, well-nourished female in no acute distress.  LUNGS: Clear to auscultation bilaterally.  HEART: Regular rate and rhythm. ABDOMEN: Soft, nontender, nondistended, gravid.  EXTREMITIES: Nontender, no edema, 2+ distal pulses. Grossly neurologically intact CERVICAL EXAM: Dilatation 0cm   Effacement 0%   Station -3   Presentation: cephalic FHT:  Baseline rate 135 bpm   Variability moderate  Accelerations present   Decelerations none Contractions:None   Labs: No results found for this or any previous visit (from the past 24 hour(s)). Imaging Studies:  No results found.  Assessment: Mary Mitchell is  18 y.o. [phone] [id number] at 4980w5d presents with vaginal discharge.  Plan: 1. Vaginal Discharge - Likely either ejaculate, or pt. May have urinated self unknowingly.  Crist Fat- Fern  negative x 2 - No signs of active labor, cervical exam reassuring - FHT reactive.  - Discharge home with plans to return if continued LOf, bleeding, or onset of labor.   2. IUP @ [redacted]W[redacted]D - Reactive FHT - Cervical exam reassuring - Continues to have poor follow up for [facility]. Pt. Advised that it is imperative that she make her [facility] appointments. Obstructions to care discussed, and pt. Promises to make next appointment.  - Home and advised to return for worsening, or continuation of symptoms.   Thanks for letting us take  care of you!  Miski Feldpausch, Hillery Hunter 7/27/20151:43 AM

## 2014-04-14 NOTE — MAU Note (Signed)
Patient standing at the side of the bed due to discomfort. Not tracing continuously

## 2014-04-14 NOTE — MAU Provider Note (Signed)
I examined pt and agree with documentation above and resident plan of care. Kalil Woessner N Karim, CNM  

## 2014-04-14 NOTE — Discharge Instructions (Signed)
Premature Rupture and Preterm Premature Rupture of Membranes Premature rupture of membranes (PROM) is when the membranes (amniotic sac) break open before contractions or labor starts. Rupture of membranes is commonly referred to as your water breaking. If PROM occurs before 37 weeks of pregnancy, it is called preterm premature rupture of membranes (PPROM). The amniotic sac holds the fetus, keeps infection out, and performs other important functions. Having the amniotic sac rupture before 37 weeks of pregnancy can lead to serious problems and requires immediate attention by your health care provider. CAUSES  PROM near the end of the pregnancy may be caused by natural weakening of the membranes. PPROM is often due to an infection. Other factors that may be associated with PROM include:  Stretching of the amniotic sac because of carrying multiples or having too much amniotic fluid.  Trauma.  Smoking during pregnancy.  Poor nutrition.  Previous preterm birth.  Vaginal bleeding.  Little to no prenatal care.  Problems with the placenta, such as placenta previa or placental abruption. RISKS OF PROM AND PPROM  Delivering a premature baby.  Getting a serious infection of the placental tissues (chorioamnionitis).  Early detachment of the placenta from the uterus (placental abruption).  Compression of the umbilical cord.  Needing a cesarean birth.  Developing a serious infection after delivery. SIGNS OF PROM OR PPROM   A sudden gush or slow leaking of fluid from the vagina.  Constant wet underwear. Sometimes, women mistake the leaking or wetness for urine, especially if the leak is slow and not a gush of fluid. If there is constant leaking or your underwear continues to get wet, your membranes have likely ruptured. WHAT TO DO IF YOU THINK YOUR MEMBRANES HAVE RUPTURED Call your health care provider right away. You will need to go to the hospital to get checked immediately. WHAT HAPPENS  IF YOU ARE DIAGNOSED WITH PROM OR PPROM? Once you arrive at the hospital, you will have tests done. A cervical exam will be performed to check if the cervix has softened or started to open (dilate). If you are diagnosed with PROM, you may be induced within 24 hours if you are not having contractions. If you are diagnosed with PPROM and are not having contractions, you may be induced depending on your trimester.  If you have PPROM, you:  And your baby will be monitored closely for signs of infection or other complications.  May be given an antibiotic medicine to lower the chances of an infection developing.  May be given a steroid medicine to help mature the baby's lungs faster.  May be given a medicine to stop preterm labor.  May be ordered to be on bed rest at home or in the hospital.  May be induced if complications arise for you or the baby. Your treatment will depend on many factors, such as how far along you are, the development of the baby, and other complications that may arise. Document Released: 09/05/2005 Document Revised: 06/26/2013 Document Reviewed: 12/25/2012 [facility] Patient Information 2015 [employer], [place]. This information is not intended to replace advice given to you by your health care provider. Make sure you discuss any questions you have with your health care provider. Third Trimester of Pregnancy The third trimester is from week 29 through week 42, months 7 through 9. The third trimester is a time when the fetus is growing rapidly. At the end of the ninth month, the fetus is about 20 inches in length and weighs 6-10 pounds.  BODY CHANGES  Your body goes through many changes during pregnancy. The changes vary from woman to woman.   Your weight will continue to increase. You can expect to gain 25-35 pounds (11-16 kg) by the end of the pregnancy.  You may begin to get stretch marks on your hips, abdomen, and breasts.  You may urinate more often because the fetus is  moving lower into your pelvis and pressing on your bladder.  You may develop or continue to have heartburn as a result of your pregnancy.  You may develop constipation because certain hormones are causing the muscles that push waste through your intestines to slow down.  You may develop hemorrhoids or swollen, bulging veins (varicose veins).  You may have pelvic pain because of the weight gain and pregnancy hormones relaxing your joints between the bones in your pelvis. Backaches may result from overexertion of the muscles supporting your posture.  You may have changes in your hair. These can include thickening of your hair, rapid growth, and changes in texture. Some women also have hair loss during or after pregnancy, or hair that feels dry or thin. Your hair will most likely return to normal after your baby is born.  Your breasts will continue to grow and be tender. A yellow discharge may leak from your breasts called colostrum.  Your belly button may stick out.  You may feel short of breath because of your expanding uterus.  You may notice the fetus "dropping," or moving lower in your abdomen.  You may have a bloody mucus discharge. This usually occurs a few days to a week before labor begins.  Your cervix becomes thin and soft (effaced) near your due date. WHAT TO EXPECT AT YOUR PRENATAL EXAMS  You will have prenatal exams every 2 weeks until week 36. Then, you will have weekly prenatal exams. During a routine prenatal visit:  You will be weighed to make sure you and the fetus are growing normally.  Your blood pressure is taken.  Your abdomen will be measured to track your baby's growth.  The fetal heartbeat will be listened to.  Any test results from the previous visit will be discussed.  You may have a cervical check near your due date to see if you have effaced. At around 36 weeks, your caregiver will check your cervix. At the same time, your caregiver will also perform a  test on the secretions of the vaginal tissue. This test is to determine if a type of bacteria, Group B streptococcus, is present. Your caregiver will explain this further. Your caregiver may ask you:  What your birth plan is.  How you are feeling.  If you are feeling the baby move.  If you have had any abnormal symptoms, such as leaking fluid, bleeding, severe headaches, or abdominal cramping.  If you have any questions. Other tests or screenings that may be performed during your third trimester include:  Blood tests that check for low iron levels (anemia).  Fetal testing to check the health, activity level, and growth of the fetus. Testing is done if you have certain medical conditions or if there are problems during the pregnancy. FALSE LABOR You may feel small, irregular contractions that eventually go away. These are called Braxton Hicks contractions, or false labor. Contractions may last for hours, days, or even weeks before true labor sets in. If contractions come at regular intervals, intensify, or become painful, it is best to be seen by your caregiver.  SIGNS OF LABOR  Menstrual-like cramps.  Contractions that are 5 minutes apart or less.  Contractions that start on the top of the uterus and spread down to the lower abdomen and back.  A sense of increased pelvic pressure or back pain.  A watery or bloody mucus discharge that comes from the vagina. If you have any of these signs before the 37th week of pregnancy, call your caregiver right away. You need to go to the hospital to get checked immediately. HOME CARE INSTRUCTIONS   Avoid all smoking, herbs, alcohol, and unprescribed drugs. These chemicals affect the formation and growth of the baby.  Follow your caregiver's instructions regarding medicine use. There are medicines that are either safe or unsafe to take during pregnancy.  Exercise only as directed by your caregiver. Experiencing uterine cramps is a good sign to  stop exercising.  Continue to eat regular, healthy meals.  Wear a good support bra for breast tenderness.  Do not use hot tubs, steam rooms, or saunas.  Wear your seat belt at all times when driving.  Avoid raw meat, uncooked cheese, cat litter boxes, and soil used by cats. These carry germs that can cause birth defects in the baby.  Take your prenatal vitamins.  Try taking a stool softener (if your caregiver approves) if you develop constipation. Eat more high-fiber foods, such as fresh vegetables or fruit and whole grains. Drink plenty of fluids to keep your urine clear or pale yellow.  Take warm sitz baths to soothe any pain or discomfort caused by hemorrhoids. Use hemorrhoid cream if your caregiver approves.  If you develop varicose veins, wear support hose. Elevate your feet for 15 minutes, 3-4 times a day. Limit salt in your diet.  Avoid heavy lifting greater than 25lb., wear low heal shoes, and practice good posture.  Rest a lot with your legs elevated if you have leg cramps or low back pain.  Visit your dentist if you have not gone during your pregnancy. Use a soft toothbrush to brush your teeth and be gentle when you floss.  A sexual relationship may be continued unless your caregiver directs you otherwise.  Do not travel far distances unless it is absolutely necessary and only with the approval of your caregiver.  Take prenatal classes to understand, practice, and ask questions about the labor and delivery.  Make a trial run to the hospital.  Pack your hospital bag.  Prepare the baby's nursery.  Continue to go to all your prenatal visits as directed by your caregiver. SEEK MEDICAL CARE IF:  You are unsure if you are in labor or if your water has broken.  You have dizziness.  You have mild pelvic cramps, pelvic pressure, or nagging pain in your abdominal area.  You have persistent nausea, vomiting, or diarrhea.  You have a bad smelling vaginal  discharge.  You have pain with urination. SEEK IMMEDIATE MEDICAL CARE IF:   You have a fever.  You are leaking fluid from your vagina.  You have spotting or bleeding from your vagina.  You have severe abdominal cramping or pain.  You have rapid weight loss or gain.  You have shortness of breath with chest pain.  You notice sudden or extreme swelling of your face, hands, ankles, feet, or legs.  You have not felt your baby move in over an hour.  You have severe headaches that do not go away with medicine.  You have vision changes. Document Released: 08/30/2001 Document Revised: 09/10/2013 Document Reviewed: 11/06/2012 ExitCare Patient  Information 2015 [place], [place]. This information is not intended to replace advice given to you by your health care provider. Make sure you discuss any questions you have with your health care provider.

## 2014-04-14 NOTE — MAU Note (Signed)
Patient presents via EMS with complaints of leaking clear fluid and abdominal tightening since 2230 last night. Reports no fetal movement all day. Denies vaginal bleeding.

## 2014-04-14 NOTE — MAU Provider Note (Signed)
Chief Complaint:  Contractions   First Provider Initiated Contact with Patient 04/14/14 2014      HPI: Mary Mitchell is a 18 y.o. [id number] at [redacted]w[redacted]d who presents to maternity admissions reporting contractions.  Reported she was seen last PM and was complaining of contractions (although per note, reports pt concern from ROM). She reports she went home and went to sleep, but when she woke up, contractions had continued and have become stronger throughout the day. Reports contractions are so strong that she can barely walk. States they are coming on "all the time" but is unable to quantify frequency.  Denies leakage of fluid or vaginal bleeding. Good fetal movement.   Pregnancy Course:   Past Medical History: Past Medical History  Diagnosis Date  . Asthma     Past obstetric history: OB History  Gravida Para Term Preterm AB SAB TAB Ectopic Multiple Living  4 3 3  0 0 0 0 0 0 3    # Outcome Date GA Lbr Len/2nd Weight Sex Delivery Anes PTL Lv  4 CUR           3 TRM 03/12/13 106w0d  6 lb 9 oz (2.977 kg) F SVD EPI  Y     Comments: baby came out blue, "was stuck for a while, low oxygen to brain"   2 TRM 03/08/11 [redacted]w[redacted]d  5 lb 4 oz (2.381 kg) F SVD EPI  Y  1 TRM 04/07/10 [redacted]w[redacted]d  6 lb 3 oz (2.807 kg) F SVD EPI  Y      Past Surgical History: Past Surgical History  Procedure Laterality Date  . Abdominal hernia repair       Family History: Family History  Problem Relation Age of Onset  . Diabetes Mother   . Hypertension Mother   . Diabetes Maternal Grandmother   . Hypertension Maternal Grandmother   . Heart disease Maternal Grandmother     Social History: History  Substance Use Topics  . Smoking status: Current Some Day Smoker -- 0.25 packs/day    Types: Cigarettes  . Smokeless tobacco: Not on file  . Alcohol Use: No    Allergies: No Known Allergies  Meds:  No prescriptions prior to admission    ROS: Pertinent findings in history of present illness.  Physical Exam   Last menstrual period 08/14/2013. GENERAL: Well-developed, well-nourished female in obvious distress.  HEENT: normocephalic HEART: normal rate RESP: normal effort ABDOMEN: Soft, non-tender, gravid appropriate for gestational age EXTREMITIES: Nontender, no edema NEURO: alert and oriented SPECULUM EXAM: NEFG, physiologic discharge, no blood, cervix clean Dilation: 2 Effacement (%): 50 Station: -3 Exam by:: Woody Seller, MD  FHT:  Baseline 140 , moderate variability, accelerations present, no decelerations Contractions: irregular   Labs: No results found for this or any previous visit (from the past 24 hour(s)).  Imaging:  No results found.  MAU Course:  Pt presented to MAU in what appeared to be in labor with extreme discomfort and yelling and inability to sit still or tolerate exam. After being coached into exam, SVE was performed which revealed no pooling. Ferning was negative and sperm were present on wet prep. SVE was performed which was 2/50/-3. Given pt's extreme discomfort, she was given fentanyl 50 mg IM which resulted in patient falling asleep. Her cervix was re-evaluated 2 hours later and she remained 2/50/-3. Irregular and infrequent contractions were noted on toco. Given pt's lack of cervical change, she was discharged home and deemed to not be in active labor/  Assessment: 1. False labor     Plan: Discharge home Pre-term Labor precautions and fetal kick counts     Follow-up Information   Follow up with No PCP Per Patient. Schedule an appointment as soon as possible for a visit in 2 days.   Specialty:  General Practice       Medication List         albuterol 108 (90 BASE) MCG/ACT inhaler  Commonly known as:  PROVENTIL HFA;VENTOLIN HFA  Inhale 1-2 puffs into the lungs every 6 (six) hours as needed for wheezing or shortness of breath.     prenatal multivitamin Tabs tablet  Take 1 tablet by mouth daily at 12 noon.        Mary Chickaroline Jontavia Leatherbury,  MD 04/14/2014 11:01 PM

## 2014-04-14 NOTE — MAU Note (Signed)
Pt reports contractions since yesterday, have continued to get worse since then.

## 2014-04-14 NOTE — Discharge Instructions (Signed)
Abdominal Pain During Pregnancy °Belly (abdominal) pain is common during pregnancy. Most of the time, it is not a serious problem. Other times, it can be a sign that something is wrong with the pregnancy. Always tell your doctor if you have belly pain. °HOME CARE °Monitor your belly pain for any changes. The following actions may help you feel better: °· Do not have sex (intercourse) or put anything in your vagina until you feel better. °· Rest until your pain stops. °· Drink clear fluids if you feel sick to your stomach (nauseous). Do not eat solid food until you feel better. °· Only take medicine as told by your doctor. °· Keep all doctor visits as told. °GET HELP RIGHT AWAY IF:  °· You are bleeding, leaking fluid, or pieces of tissue come out of your vagina. °· You have more pain or cramping. °· You keep throwing up (vomiting). °· You have pain when you pee (urinate) or have blood in your pee. °· You have a fever. °· You do not feel your baby moving as much. °· You feel very weak or feel like passing out. °· You have trouble breathing, with or without belly pain. °· You have a very bad headache and belly pain. °· You have fluid leaking from your vagina and belly pain. °· You keep having watery poop (diarrhea). °· Your belly pain does not go away after resting, or the pain gets worse. °MAKE SURE YOU:  °· Understand these instructions. °· Will watch your condition. °· Will get help right away if you are not doing well or get worse. °Document Released: 08/24/2009 Document Revised: 05/08/2013 Document Reviewed: 04/04/2013 °ExitCare® Patient Information ©2015 [employer]. This information is not intended to replace advice given to you by your health care provider. Make sure you discuss any questions you have with your health care provider. ° °

## 2014-04-20 ENCOUNTER — Inpatient Hospital Stay (HOSPITAL_COMMUNITY)
Admission: AD | Admit: 2014-04-20 | Discharge: 2014-04-20 | Disposition: A | Discharge status: Home / Self Care | Payer: Medicaid Other | Source: Ambulatory Visit | Attending: Obstetrics and Gynecology | Admitting: Obstetrics and Gynecology

## 2014-04-20 ENCOUNTER — Encounter (HOSPITAL_COMMUNITY): Payer: Self-pay

## 2014-04-20 DIAGNOSIS — O99330 Smoking (tobacco) complicating pregnancy, unspecified trimester: Secondary | ICD-10-CM | POA: Diagnosis not present

## 2014-04-20 DIAGNOSIS — O9989 Other specified diseases and conditions complicating pregnancy, childbirth and the puerperium: Principal | ICD-10-CM

## 2014-04-20 DIAGNOSIS — E860 Dehydration: Secondary | ICD-10-CM | POA: Insufficient documentation

## 2014-04-20 DIAGNOSIS — R109 Unspecified abdominal pain: Secondary | ICD-10-CM | POA: Insufficient documentation

## 2014-04-20 DIAGNOSIS — O99891 Other specified diseases and conditions complicating pregnancy: Secondary | ICD-10-CM | POA: Insufficient documentation

## 2014-04-20 DIAGNOSIS — G44209 Tension-type headache, unspecified, not intractable: Secondary | ICD-10-CM | POA: Diagnosis not present

## 2014-04-20 DIAGNOSIS — Z720 Tobacco use: Secondary | ICD-10-CM

## 2014-04-20 DIAGNOSIS — R51 Headache: Secondary | ICD-10-CM | POA: Diagnosis present

## 2014-04-20 LAB — URINALYSIS, ROUTINE W REFLEX MICROSCOPIC
Glucose, UA: NEGATIVE mg/dL
Hgb urine dipstick: NEGATIVE
Ketones, ur: 15 mg/dL — AB
Leukocytes, UA: NEGATIVE
Nitrite: NEGATIVE
Protein, ur: NEGATIVE mg/dL
Specific Gravity, Urine: 1.025 (ref 1.005–1.030)
Urobilinogen, UA: 1 mg/dL (ref 0.0–1.0)
pH: 6 (ref 5.0–8.0)

## 2014-04-20 LAB — RAPID URINE DRUG SCREEN, HOSP PERFORMED
Amphetamines: NOT DETECTED
BARBITURATES: NOT DETECTED
Benzodiazepines: NOT DETECTED
Cocaine: NOT DETECTED
Opiates: NOT DETECTED
Tetrahydrocannabinol: NOT DETECTED

## 2014-04-20 MED ORDER — OXYCODONE-ACETAMINOPHEN 5-325 MG PO TABS
1.0000 | ORAL_TABLET | Freq: Four times a day (QID) | ORAL | Status: DC | PRN
  Administered 2014-04-20: 2 via ORAL
  Filled 2014-04-20: qty 2

## 2014-04-20 NOTE — MAU Note (Signed)
Pt discharged to home per D Poe with normal orthostatic vital signs

## 2014-04-20 NOTE — MAU Note (Signed)
Pt presents complaining of a headache x2 days that is not relieved by Tylenol, dizziness, and abdominal cramping. Denies vaginal bleeding or discharge. Reports decreased fetal movement.

## 2014-04-20 NOTE — MAU Provider Note (Signed)
Chief Complaint:  Abdominal Cramping and Headache   First Provider Initiated Contact with Patient 04/20/14 1612      HPI: Mary Mitchell is a 18 y.o. G4P3003 at [redacted]w[redacted]d who presents reporting tight band-like frontal H/A unresponsive to Tylenol and unabated x 2 days. Last took 2 ES Tylenol at 0100. Also having lower abdominal cramping, occasional blurred vision and loose BMS. Denies N/V. Anxious for labor. Cx was 2/50 last check.  Denies  leakage of fluid or vaginal bleeding. Good fetal movement.   Pregnancy Course: Insufficient PNC at [facility] (missed appts) . Rh neg, Smoker  Past Medical History: Past Medical History  Diagnosis Date  . Asthma     Past obstetric history: OB History  Gravida Para Term Preterm AB SAB TAB Ectopic Multiple Living  4 3 3  0 0 0 0 0 0 3    # Outcome Date GA Lbr Len/2nd Weight Sex Delivery Anes PTL Lv  4 CUR           3 TRM 03/12/13 [redacted]w[redacted]d  2.977 kg (6 lb 9 oz) F SVD EPI  Y     Comments: baby came out blue, "was stuck for a while, low oxygen to brain"   2 TRM 03/08/11 [redacted]w[redacted]d  2.381 kg (5 lb 4 oz) F SVD EPI  Y  1 TRM 04/07/10 [redacted]w[redacted]d  2.807 kg (6 lb 3 oz) F SVD EPI  Y      Past Surgical History: Past Surgical History  Procedure Laterality Date  . Abdominal hernia repair       Family History: Family History  Problem Relation Age of Onset  . Diabetes Mother   . Hypertension Mother   . Diabetes Maternal Grandmother   . Hypertension Maternal Grandmother   . Heart disease Maternal Grandmother     Social History: History  Substance Use Topics  . Smoking status: Current Some Day Smoker -- 0.25 packs/day    Types: Cigarettes  . Smokeless tobacco: Not on file  . Alcohol Use: No    Allergies: No Known Allergies  Meds:  Prescriptions prior to admission  Medication Sig Dispense Refill  . acetaminophen (TYLENOL) 500 MG tablet Take 1,000 mg by mouth 2 (two) times daily as needed for mild pain or headache.      . Prenatal Vit-Fe Fumarate-FA (PRENATAL  MULTIVITAMIN) TABS tablet Take 1 tablet by mouth daily.       Marland Kitchen albuterol (PROVENTIL HFA;VENTOLIN HFA) 108 (90 BASE) MCG/ACT inhaler Inhale 1-2 puffs into the lungs every 6 (six) hours as needed for wheezing or shortness of breath.  1 Inhaler  3    ROS: Pertinent findings in history of present illness.  Physical Exam  Blood pressure 132/73, pulse 114, temperature 98 F (36.7 C), temperature source Oral, resp. rate 18, last menstrual period 08/14/2013. GENERAL: Well-developed, well-nourished female in no acute distress.  HEENT: normocephalic HEART: normal rate RESP: normal effort ABDOMEN: Soft, non-tender, gravid appropriate for gestational age EXTREMITIES: Nontender, no edema NEURO: alert and oriented SPECULUM EXAM: NEFG, physiologic discharge, no blood, cervix clean   Dilation: 2 Effacement (%): 50 Presentation: Vertex Exam by:: D. Kahliyah Dick CNM Cx tight 2/long/ cephalic OOP  FHT:  Baseline 125 , moderate variability, accelerations present, no decelerations Contractions: some UI   Labs: Results for orders placed during the hospital encounter of 04/20/14 (from the past 24 hour(s))  URINALYSIS, ROUTINE W REFLEX MICROSCOPIC     Status: Abnormal   Collection Time    04/20/14  3:47 PM  Result Value Ref Range   Color, Urine YELLOW  YELLOW   APPearance CLEAR  CLEAR   Specific Gravity, Urine 1.025  1.005 - 1.030   pH 6.0  5.0 - 8.0   Glucose, UA NEGATIVE  NEGATIVE mg/dL   Hgb urine dipstick NEGATIVE  NEGATIVE   Bilirubin Urine SMALL (*) NEGATIVE   Ketones, ur 15 (*) NEGATIVE mg/dL   Protein, ur NEGATIVE  NEGATIVE mg/dL   Urobilinogen, UA 1.0  0.0 - 1.0 mg/dL   Nitrite NEGATIVE  NEGATIVE   Leukocytes, UA NEGATIVE  NEGATIVE    Imaging:  No results found. MAU Course: Percocet 5/325mg  2 po given> H/A resolved. Weak and dizzy> check orthostatics PO fluids  Assessment: 1. Tension headache   2. Dehydration, mild   3. Tobacco abuse     Plan: Discharge home Labor precautions  and fetal kick counts Teen G4P3003 at 1082w4d Cat 1 FHR    Medication List         acetaminophen 500 MG tablet  Commonly known as:  TYLENOL  Take 1,000 mg by mouth 2 (two) times daily as needed for mild pain or headache.     albuterol 108 (90 BASE) MCG/ACT inhaler  Commonly known as:  PROVENTIL HFA;VENTOLIN HFA  Inhale 1-2 puffs into the lungs every 6 (six) hours as needed for wheezing or shortness of breath.     prenatal multivitamin Tabs tablet  Take 1 tablet by mouth daily.       Follow-up Information   Follow up with [facility]. (Keep your scheduled prenatal appointment)    Contact information:   [address] [place] [id number]       Danae Orleanseirdre C Tashea Othman, CNM 04/20/2014 4:14 PM

## 2014-04-20 NOTE — Discharge Instructions (Signed)
Dehydration, Adult Dehydration means your body does not have as much fluid as it needs. Your kidneys, brain, and heart will not work properly without the right amount of fluids and salt.  HOME CARE  Ask your doctor how to replace body fluid losses (rehydrate).  Drink enough fluids to keep your pee (urine) clear or pale yellow.  Drink small amounts of fluids often if you feel sick to your stomach (nauseous) or throw up (vomit).  Eat like you normally do.  Avoid:  Foods or drinks high in sugar.  Bubbly (carbonated) drinks.  Juice.  Very hot or cold fluids.  Drinks with caffeine.  Fatty, greasy foods.  Alcohol.  Tobacco.  Eating too much.  Gelatin desserts.  Wash your hands to avoid spreading germs (bacteria, viruses).  Only take medicine as told by your doctor.  Keep all doctor visits as told. GET HELP RIGHT AWAY IF:   You cannot drink something without throwing up.  You get worse even with treatment.  Your vomit has blood in it or looks greenish.  Your poop (stool) has blood in it or looks black and tarry.  You have not peed in 6 to 8 hours.  You pee a small amount of very dark pee.  You have a fever.  You pass out (faint).  You have belly (abdominal) pain that gets worse or stays in one spot (localizes).  You have a rash, stiff neck, or bad headache.  You get easily annoyed, sleepy, or are hard to wake up.  You feel weak, dizzy, or very thirsty. MAKE SURE YOU:   Understand these instructions.  Will watch your condition.  Will get help right away if you are not doing well or get worse. Document Released: 07/02/2009 Document Revised: 11/28/2011 Document Reviewed: 04/25/2011 [facility] Patient Information 2015 [place], [place]. This information is not intended to replace advice given to you by your health care provider. Make sure you discuss any questions you have with your health care provider. Tension Headache A tension headache is a feeling of  pain, pressure, or aching often felt over the front and sides of the head. The pain can be dull or can feel tight (constricting). It is the most common type of headache. Tension headaches are not normally associated with nausea or vomiting and do not get worse with physical activity. Tension headaches can last 30 minutes to several days.  CAUSES  The exact cause is not known, but it may be caused by chemicals and hormones in the brain that lead to pain. Tension headaches often begin after stress, anxiety, or depression. Other triggers may include:  Alcohol.  Caffeine (too much or withdrawal).  Respiratory infections (colds, flu, sinus infections).  Dental problems or teeth clenching.  Fatigue.  Holding your head and neck in one position too long while using a computer. SYMPTOMS   Pressure around the head.   Dull, aching head pain.   Pain felt over the front and sides of the head.   Tenderness in the muscles of the head, neck, and shoulders. DIAGNOSIS  A tension headache is often diagnosed based on:   Symptoms.   Physical examination.   A CT scan or MRI of your head. These tests may be ordered if symptoms are severe or unusual. TREATMENT  Medicines may be given to help relieve symptoms.  HOME CARE INSTRUCTIONS   Only take over-the-counter or prescription medicines for pain or discomfort as directed by your caregiver.   Lie down in a dark, quiet  room when you have a headache.   Keep a journal to find out what may be triggering your headaches. For example, write down:  What you eat and drink.  How much sleep you get.  Any change to your diet or medicines.  Try massage or other relaxation techniques.   Ice packs or heat applied to the head and neck can be used. Use these 3 to 4 times per day for 15 to 20 minutes each time, or as needed.   Limit stress.   Sit up straight, and do not tense your muscles.   Quit smoking if you smoke.  Limit alcohol  use.  Decrease the amount of caffeine you drink, or stop drinking caffeine.  Eat and exercise regularly.  Get 7 to 9 hours of sleep, or as recommended by your caregiver.  Avoid excessive use of pain medicine as recurrent headaches can occur.  SEEK MEDICAL CARE IF:   You have problems with the medicines you were prescribed.  Your medicines do not work.  You have a change from the usual headache.  You have nausea or vomiting. SEEK IMMEDIATE MEDICAL CARE IF:   Your headache becomes severe.  You have a fever.  You have a stiff neck.  You have loss of vision.  You have muscular weakness or loss of muscle control.  You lose your balance or have trouble walking.  You feel faint or pass out.  You have severe symptoms that are different from your first symptoms. MAKE SURE YOU:   Understand these instructions.  Will watch your condition.  Will get help right away if you are not doing well or get worse. Document Released: 09/05/2005 Document Revised: 11/28/2011 Document Reviewed: 08/26/2011 [facility] Patient Information 2015 [place], [place]. This information is not intended to replace advice given to you by your health care provider. Make sure you discuss any questions you have with your health care provider.

## 2014-04-20 NOTE — MAU Provider Note (Signed)
Attestation of Attending Supervision of Advanced Practitioner (CNM/NP): Evaluation and management procedures were performed by the Advanced Practitioner under my supervision and collaboration.  I have reviewed the Advanced Practitioner's note and chart, and I agree with the management and plan.  Bethlehem Langstaff 04/20/2014 8:14 PM

## 2014-04-20 NOTE — MAU Note (Signed)
Pt complaining of dizziness following percocet administration. D Poe notified and ordered orthostatic vital signs. Pt normotensive but still complaining of feeling lightheaded. Pt resting in bed with side rails up

## 2014-04-24 ENCOUNTER — Encounter: Payer: Self-pay | Admitting: Obstetrics & Gynecology

## 2014-04-24 ENCOUNTER — Ambulatory Visit (INDEPENDENT_AMBULATORY_CARE_PROVIDER_SITE_OTHER): Payer: Medicaid Other | Admitting: Obstetrics & Gynecology

## 2014-04-24 VITALS — BP 136/65 | HR 100 | Temp 98.3°F | Wt 186.7 lb

## 2014-04-24 DIAGNOSIS — Z3483 Encounter for supervision of other normal pregnancy, third trimester: Secondary | ICD-10-CM

## 2014-04-24 DIAGNOSIS — Z720 Tobacco use: Secondary | ICD-10-CM

## 2014-04-24 DIAGNOSIS — Z3480 Encounter for supervision of other normal pregnancy, unspecified trimester: Secondary | ICD-10-CM

## 2014-04-24 DIAGNOSIS — F17200 Nicotine dependence, unspecified, uncomplicated: Secondary | ICD-10-CM

## 2014-04-24 DIAGNOSIS — Z23 Encounter for immunization: Secondary | ICD-10-CM

## 2014-04-24 LAB — OB RESULTS CONSOLE GBS: GBS: POSITIVE

## 2014-04-24 LAB — POCT URINALYSIS DIP (DEVICE)
Glucose, UA: NEGATIVE mg/dL
HGB URINE DIPSTICK: NEGATIVE
KETONES UR: 40 mg/dL — AB
Leukocytes, UA: NEGATIVE
Nitrite: NEGATIVE
Protein, ur: 30 mg/dL — AB
SPECIFIC GRAVITY, URINE: 1.02 (ref 1.005–1.030)
Urobilinogen, UA: 2 mg/dL — ABNORMAL HIGH (ref 0.0–1.0)
pH: 6.5 (ref 5.0–8.0)

## 2014-04-24 LAB — OB RESULTS CONSOLE GC/CHLAMYDIA
CHLAMYDIA, DNA PROBE: NEGATIVE
Gonorrhea: NEGATIVE

## 2014-04-24 MED ORDER — TETANUS-DIPHTH-ACELL PERTUSSIS 5-2.5-18.5 LF-MCG/0.5 IM SUSP: 0.5000 mL | Freq: Once | INTRAMUSCULAR | Status: DC

## 2014-04-24 NOTE — Addendum Note (Signed)
Addended by: Candelaria StagersHAIZLIP, Nahomi Hegner E on: 04/24/2014 11:26 AM   Modules accepted: Orders

## 2014-04-24 NOTE — Progress Notes (Signed)
No complaints.  Wants IUD.  UDS today due to lapse in care.  GCT was done at 24 weeks which is slightly early, but pt is low risk and does not want to take again.  Tdap today.

## 2014-04-24 NOTE — Progress Notes (Signed)
C/o of intermittent pelvic pressure and cramping.  Reports edema in hands and feet.  GBS and cultures today.

## 2014-04-25 ENCOUNTER — Encounter: Payer: Self-pay | Admitting: Obstetrics & Gynecology

## 2014-04-25 DIAGNOSIS — O99820 Streptococcus B carrier state complicating pregnancy: Secondary | ICD-10-CM | POA: Insufficient documentation

## 2014-04-25 LAB — GC/CHLAMYDIA PROBE AMP
CT Probe RNA: NEGATIVE
GC Probe RNA: NEGATIVE

## 2014-04-25 LAB — CULTURE, BETA STREP (GROUP B ONLY)

## 2014-05-05 ENCOUNTER — Ambulatory Visit (INDEPENDENT_AMBULATORY_CARE_PROVIDER_SITE_OTHER): Payer: Medicaid Other | Admitting: Family Medicine

## 2014-05-05 VITALS — BP 130/75 | HR 100 | Temp 98.5°F | Wt 189.8 lb

## 2014-05-05 DIAGNOSIS — Z3480 Encounter for supervision of other normal pregnancy, unspecified trimester: Secondary | ICD-10-CM

## 2014-05-05 DIAGNOSIS — Z3483 Encounter for supervision of other normal pregnancy, third trimester: Secondary | ICD-10-CM

## 2014-05-05 LAB — POCT URINALYSIS DIP (DEVICE)
BILIRUBIN URINE: NEGATIVE
Glucose, UA: NEGATIVE mg/dL
Hgb urine dipstick: NEGATIVE
LEUKOCYTES UA: NEGATIVE
Nitrite: NEGATIVE
Protein, ur: NEGATIVE mg/dL
Specific Gravity, Urine: 1.015 (ref 1.005–1.030)
Urobilinogen, UA: 0.2 mg/dL (ref 0.0–1.0)
pH: 7 (ref 5.0–8.0)

## 2014-05-05 NOTE — Progress Notes (Signed)
Reports spotting yesterday night with cramping on and off with lower back pain.  Reports edema in feet and hands.

## 2014-05-05 NOTE — Progress Notes (Signed)
Patient is 18 y.o. [id number].  +FM, +spotting (no intercourse, has resolved),  denies LOF, contractions, vaginal discharge.  No HA, scotoma, RUQ pain. SVE: 2/thick/high, posterior, moderate

## 2014-05-05 NOTE — Patient Instructions (Signed)
Fetal Movement Counts Patient Name: __________________________________________________ Patient Due Date: ____________________ Performing a fetal movement count is highly recommended in high-risk pregnancies, but it is good for every pregnant woman to do. Your health care provider may ask you to start counting fetal movements at 28 weeks of the pregnancy. Fetal movements often increase:  After eating a full meal.  After physical activity.  After eating or drinking something sweet or cold.  At rest. Pay attention to when you feel the baby is most active. This will help you notice a pattern of your baby's sleep and wake cycles and what factors contribute to an increase in fetal movement. It is important to perform a fetal movement count at the same time each day when your baby is normally most active.  HOW TO COUNT FETAL MOVEMENTS 1. Find a quiet and comfortable area to sit or lie down on your left side. Lying on your left side provides the best blood and oxygen circulation to your baby. 2. Write down the day and time on a sheet of paper or in a journal. 3. Start counting kicks, flutters, swishes, rolls, or jabs in a 2-hour period. You should feel at least 10 movements within 2 hours. 4. If you do not feel 10 movements in 2 hours, wait 2-3 hours and count again. Look for a change in the pattern or not enough counts in 2 hours. SEEK MEDICAL CARE IF:  You feel less than 10 counts in 2 hours, tried twice.  There is no movement in over an hour.  The pattern is changing or taking longer each day to reach 10 counts in 2 hours.  You feel the baby is not moving as he or she usually does. Date: ____________ Movements: ____________ Start time: ____________ Finish time: ____________  Date: ____________ Movements: ____________ Start time: ____________ Finish time: ____________ Date: ____________ Movements: ____________ Start time: ____________ Finish time: ____________ Date: ____________ Movements:  ____________ Start time: ____________ Finish time: ____________ Date: ____________ Movements: ____________ Start time: ____________ Finish time: ____________ Date: ____________ Movements: ____________ Start time: ____________ Finish time: ____________ Date: ____________ Movements: ____________ Start time: ____________ Finish time: ____________ Date: ____________ Movements: ____________ Start time: ____________ Finish time: ____________  Date: ____________ Movements: ____________ Start time: ____________ Finish time: ____________ Date: ____________ Movements: ____________ Start time: ____________ Finish time: ____________ Date: ____________ Movements: ____________ Start time: ____________ Finish time: ____________ Date: ____________ Movements: ____________ Start time: ____________ Finish time: ____________ Date: ____________ Movements: ____________ Start time: ____________ Finish time: ____________ Date: ____________ Movements: ____________ Start time: ____________ Finish time: ____________ Date: ____________ Movements: ____________ Start time: ____________ Finish time: ____________  Date: ____________ Movements: ____________ Start time: ____________ Finish time: ____________ Date: ____________ Movements: ____________ Start time: ____________ Finish time: ____________ Date: ____________ Movements: ____________ Start time: ____________ Finish time: ____________ Date: ____________ Movements: ____________ Start time: ____________ Finish time: ____________ Date: ____________ Movements: ____________ Start time: ____________ Finish time: ____________ Date: ____________ Movements: ____________ Start time: ____________ Finish time: ____________ Date: ____________ Movements: ____________ Start time: ____________ Finish time: ____________  Date: ____________ Movements: ____________ Start time: ____________ Finish time: ____________ Date: ____________ Movements: ____________ Start time: ____________ Finish  time: ____________ Date: ____________ Movements: ____________ Start time: ____________ Finish time: ____________ Date: ____________ Movements: ____________ Start time: ____________ Finish time: ____________ Date: ____________ Movements: ____________ Start time: ____________ Finish time: ____________ Date: ____________ Movements: ____________ Start time: ____________ Finish time: ____________ Date: ____________ Movements: ____________ Start time: ____________ Finish time: ____________  Date: ____________ Movements: ____________ Start time: ____________ Finish   time: ____________ Date: ____________ Movements: ____________ Start time: ____________ Doreatha Martin time: ____________ Date: ____________ Movements: ____________ Start time: ____________ Doreatha Martin time: ____________ Date: ____________ Movements: ____________ Start time: ____________ Doreatha Martin time: ____________ Date: ____________ Movements: ____________ Start time: ____________ Doreatha Martin time: ____________ Date: ____________ Movements: ____________ Start time: ____________ Doreatha Martin time: ____________ Date: ____________ Movements: ____________ Start time: ____________ Doreatha Martin time: ____________  Date: ____________ Movements: ____________ Start time: ____________ Doreatha Martin time: ____________ Date: ____________ Movements: ____________ Start time: ____________ Doreatha Martin time: ____________ Date: ____________ Movements: ____________ Start time: ____________ Doreatha Martin time: ____________ Date: ____________ Movements: ____________ Start time: ____________ Doreatha Martin time: ____________ Date: ____________ Movements: ____________ Start time: ____________ Doreatha Martin time: ____________ Date: ____________ Movements: ____________ Start time: ____________ Doreatha Martin time: ____________ Date: ____________ Movements: ____________ Start time: ____________ Doreatha Martin time: ____________  Date: ____________ Movements: ____________ Start time: ____________ Doreatha Martin time: ____________ Date: ____________  Movements: ____________ Start time: ____________ Doreatha Martin time: ____________ Date: ____________ Movements: ____________ Start time: ____________ Doreatha Martin time: ____________ Date: ____________ Movements: ____________ Start time: ____________ Doreatha Martin time: ____________ Date: ____________ Movements: ____________ Start time: ____________ Doreatha Martin time: ____________ Date: ____________ Movements: ____________ Start time: ____________ Doreatha Martin time: ____________ Date: ____________ Movements: ____________ Start time: ____________ Doreatha Martin time: ____________  Date: ____________ Movements: ____________ Start time: ____________ Doreatha Martin time: ____________ Date: ____________ Movements: ____________ Start time: ____________ Doreatha Martin time: ____________ Date: ____________ Movements: ____________ Start time: ____________ Doreatha Martin time: ____________ Date: ____________ Movements: ____________ Start time: ____________ Doreatha Martin time: ____________ Date: ____________ Movements: ____________ Start time: ____________ Doreatha Martin time: ____________ Date: ____________ Movements: ____________ Start time: ____________ Doreatha Martin time: ____________ Document Released: 10/05/2006 Document Revised: 01/20/2014 Document Reviewed: 07/02/2012 Exit[employer] Patient Information 2015 [place]. This information is not intended to replace advice given to you by your health care provider. Make sure you discuss any questions you have with your health care provider.    Group B Streptococcus Infection During Pregnancy Group B streptococcus (GBS) is a type of bacteria often found in healthy women. GBS is not the same as the bacteria that causes strep throat. You may have GBS in your vagina, rectum, or bladder. GBS does not spread through sexual contact, but it can be passed to a baby during childbirth. This can be dangerous for your baby. It is not dangerous to you and usually does not cause any symptoms. Your health care provider may test you for GBS when your  pregnancy is between 35 and 37 weeks. GBS is dangerous only during birth, so there is no need to test for it earlier. It is possible to have GBS during pregnancy and never pass it to your baby. If your test results are positive for GBS, your health care provider may recommend giving you antibiotic medicine during delivery to make sure your baby stays healthy. RISK FACTORS You are more likely to pass GBS to your baby if:   Your water breaks (ruptured membrane) or you go into labor before 37 weeks.  Your water breaks 18 hours before you deliver.  You passed GBS during a previous pregnancy.  You have a urinary tract infection caused by GBS any time during pregnancy.  You have a fever during labor. SYMPTOMS Most women who have GBS do not have any symptoms. If you have a urinary tract infection caused by GBS, you might have frequent or painful urination and fever. Babies who get GBS usually show symptoms within 7 days of birth. Symptoms may include:   Breathing problems.  Heart and blood pressure problems.  Digestive and kidney problems. DIAGNOSIS Routine screening for GBS  is recommended for all pregnant women. A health care provider takes a sample of the fluid in your vagina and rectum with a swab. It is then sent to a lab to be checked for GBS. A sample of your urine may also be checked for the bacteria.  TREATMENT If you test positive for GBS, you may need treatment with an antibiotic medicine during labor. As soon as you go into labor, or as soon as your membranes rupture, you will get the antibiotic medicine through an IV access. You will continue to get the medicine until after you give birth. You do not need antibiotic medicine if you are having a cesarean delivery.If your baby shows signs or symptoms of GBS after birth, your baby can also be treated with an antibiotic medicine. HOME CARE INSTRUCTIONS   Take all antibiotic medicine as prescribed by your health care provider. Only take  medicine as directed.   Continue with prenatal visits and care.   Keep all follow-up appointments.  SEEK MEDICAL CARE IF:   You have pain when you urinate.   You have to urinate frequently.   You have a fever.  SEEK IMMEDIATE MEDICAL CARE IF:   Your membranes rupture.  You go into labor. Document Released: 12/13/2007 Document Revised: 09/10/2013 Document Reviewed: 06/28/2013 [facility] CenterExitCare Patient Information 2015 [place], [place]. This information is not intended to replace advice given to you by your health care provider. Make sure you discuss any questions you have with your health care provider.

## 2014-05-15 ENCOUNTER — Encounter: Payer: Medicaid Other | Admitting: Obstetrics & Gynecology

## 2014-05-19 ENCOUNTER — Ambulatory Visit (INDEPENDENT_AMBULATORY_CARE_PROVIDER_SITE_OTHER): Payer: Medicaid Other | Admitting: Obstetrics & Gynecology

## 2014-05-19 ENCOUNTER — Other Ambulatory Visit (HOSPITAL_COMMUNITY)
Admission: RE | Admit: 2014-05-19 | Discharge: 2014-05-19 | Disposition: A | Discharge status: Home / Self Care | Payer: Medicaid Other | Source: Ambulatory Visit | Attending: Obstetrics & Gynecology | Admitting: Obstetrics & Gynecology

## 2014-05-19 VITALS — BP 130/78 | HR 101 | Temp 97.6°F | Wt 188.0 lb

## 2014-05-19 DIAGNOSIS — N760 Acute vaginitis: Secondary | ICD-10-CM | POA: Insufficient documentation

## 2014-05-19 DIAGNOSIS — Z3480 Encounter for supervision of other normal pregnancy, unspecified trimester: Secondary | ICD-10-CM

## 2014-05-19 DIAGNOSIS — Z3493 Encounter for supervision of normal pregnancy, unspecified, third trimester: Secondary | ICD-10-CM

## 2014-05-19 DIAGNOSIS — N898 Other specified noninflammatory disorders of vagina: Secondary | ICD-10-CM

## 2014-05-19 LAB — POCT URINALYSIS DIP (DEVICE)
Glucose, UA: NEGATIVE mg/dL
Nitrite: NEGATIVE
PH: 6.5 (ref 5.0–8.0)
PROTEIN: 100 mg/dL — AB
Specific Gravity, Urine: 1.025 (ref 1.005–1.030)
Urobilinogen, UA: 4 mg/dL — ABNORMAL HIGH (ref 0.0–1.0)

## 2014-05-19 NOTE — Addendum Note (Signed)
Addended by: Gerome Apley on: 05/19/2014 04:23 PM   Modules accepted: Orders

## 2014-05-19 NOTE — Progress Notes (Signed)
Pt c/o mucous discharge and feeling of yeast infection.  Vulva is red.  BD affirm sent.   Will treat with diflucan.  Cervix is slightly  More open but not labored.    GBS positive.  Rx in labor.  Start 2x week testing on Thursday.  Urine is not clean catch but will send urine culture.

## 2014-05-20 ENCOUNTER — Inpatient Hospital Stay (HOSPITAL_COMMUNITY)
Admission: AD | Admit: 2014-05-20 | Discharge: 2014-05-23 | DRG: 775 | Disposition: A | Discharge status: Home / Self Care | Payer: Medicaid Other | Source: Ambulatory Visit | Attending: Obstetrics & Gynecology | Admitting: Obstetrics & Gynecology

## 2014-05-20 ENCOUNTER — Encounter (HOSPITAL_COMMUNITY): Payer: Self-pay | Admitting: *Deleted

## 2014-05-20 ENCOUNTER — Inpatient Hospital Stay (HOSPITAL_COMMUNITY): Payer: Medicaid Other | Admitting: Anesthesiology

## 2014-05-20 ENCOUNTER — Encounter (HOSPITAL_COMMUNITY): Payer: Medicaid Other | Admitting: Anesthesiology

## 2014-05-20 DIAGNOSIS — O99892 Other specified diseases and conditions complicating childbirth: Secondary | ICD-10-CM | POA: Diagnosis present

## 2014-05-20 DIAGNOSIS — O360990 Maternal care for other rhesus isoimmunization, unspecified trimester, not applicable or unspecified: Secondary | ICD-10-CM | POA: Diagnosis present

## 2014-05-20 DIAGNOSIS — O9989 Other specified diseases and conditions complicating pregnancy, childbirth and the puerperium: Secondary | ICD-10-CM

## 2014-05-20 DIAGNOSIS — F319 Bipolar disorder, unspecified: Secondary | ICD-10-CM | POA: Diagnosis present

## 2014-05-20 DIAGNOSIS — Z8249 Family history of ischemic heart disease and other diseases of the circulatory system: Secondary | ICD-10-CM

## 2014-05-20 DIAGNOSIS — IMO0001 Reserved for inherently not codable concepts without codable children: Secondary | ICD-10-CM

## 2014-05-20 DIAGNOSIS — Z833 Family history of diabetes mellitus: Secondary | ICD-10-CM | POA: Diagnosis not present

## 2014-05-20 DIAGNOSIS — O99344 Other mental disorders complicating childbirth: Secondary | ICD-10-CM | POA: Diagnosis present

## 2014-05-20 DIAGNOSIS — O09899 Supervision of other high risk pregnancies, unspecified trimester: Secondary | ICD-10-CM | POA: Diagnosis not present

## 2014-05-20 DIAGNOSIS — Z22330 Carrier of Group B streptococcus: Secondary | ICD-10-CM

## 2014-05-20 DIAGNOSIS — O9902 Anemia complicating childbirth: Secondary | ICD-10-CM | POA: Diagnosis present

## 2014-05-20 DIAGNOSIS — D649 Anemia, unspecified: Secondary | ICD-10-CM | POA: Diagnosis present

## 2014-05-20 DIAGNOSIS — O99334 Smoking (tobacco) complicating childbirth: Secondary | ICD-10-CM | POA: Diagnosis present

## 2014-05-20 DIAGNOSIS — Z3483 Encounter for supervision of other normal pregnancy, third trimester: Secondary | ICD-10-CM

## 2014-05-20 DIAGNOSIS — O479 False labor, unspecified: Secondary | ICD-10-CM | POA: Diagnosis present

## 2014-05-20 HISTORY — DX: Post-traumatic stress disorder, unspecified: F43.10

## 2014-05-20 HISTORY — DX: Attention-deficit hyperactivity disorder, unspecified type: F90.9

## 2014-05-20 HISTORY — DX: Bipolar disorder, unspecified: F31.9

## 2014-05-20 LAB — CBC
HEMATOCRIT: 25.5 % — AB (ref 36.0–46.0)
Hemoglobin: 8.3 g/dL — ABNORMAL LOW (ref 12.0–15.0)
MCH: 24.6 pg — ABNORMAL LOW (ref 26.0–34.0)
MCHC: 32.5 g/dL (ref 30.0–36.0)
MCV: 75.4 fL — ABNORMAL LOW (ref 78.0–100.0)
Platelets: 228 10*3/uL (ref 150–400)
RBC: 3.38 MIL/uL — AB (ref 3.87–5.11)
RDW: 15.4 % (ref 11.5–15.5)
WBC: 6.6 10*3/uL (ref 4.0–10.5)

## 2014-05-20 MED ORDER — PHENYLEPHRINE 40 MCG/ML (10ML) SYRINGE FOR IV PUSH (FOR BLOOD PRESSURE SUPPORT)
80.0000 ug | PREFILLED_SYRINGE | INTRAVENOUS | Status: DC | PRN
  Filled 2014-05-20: qty 10
  Filled 2014-05-20: qty 2

## 2014-05-20 MED ORDER — FENTANYL 2.5 MCG/ML BUPIVACAINE 1/10 % EPIDURAL INFUSION (WH - ANES)
14.0000 mL/h | INTRAMUSCULAR | Status: DC | PRN
  Administered 2014-05-20: 14 mL/h via EPIDURAL
  Filled 2014-05-20 (×2): qty 125

## 2014-05-20 MED ORDER — ACETAMINOPHEN 325 MG PO TABS
650.0000 mg | ORAL_TABLET | ORAL | Status: DC | PRN
  Administered 2014-05-21: 650 mg via ORAL
  Filled 2014-05-20: qty 2

## 2014-05-20 MED ORDER — OXYTOCIN 40 UNITS IN LACTATED RINGERS INFUSION - SIMPLE MED
62.5000 mL/h | INTRAVENOUS | Status: DC
  Administered 2014-05-21: 62.5 mL/h via INTRAVENOUS
  Filled 2014-05-20: qty 1000

## 2014-05-20 MED ORDER — PENICILLIN G POTASSIUM 5000000 UNITS IJ SOLR
2.5000 10*6.[IU] | INTRAVENOUS | Status: DC
  Administered 2014-05-21 (×4): 2.5 10*6.[IU] via INTRAVENOUS
  Filled 2014-05-20 (×8): qty 2.5

## 2014-05-20 MED ORDER — OXYCODONE-ACETAMINOPHEN 5-325 MG PO TABS: 2.0000 | ORAL_TABLET | ORAL | Status: DC | PRN

## 2014-05-20 MED ORDER — OXYCODONE-ACETAMINOPHEN 5-325 MG PO TABS
1.0000 | ORAL_TABLET | ORAL | Status: DC | PRN
  Administered 2014-05-21: 1 via ORAL
  Administered 2014-05-21 – 2014-05-22 (×4): 2 via ORAL
  Filled 2014-05-20 (×2): qty 1
  Filled 2014-05-20: qty 2

## 2014-05-20 MED ORDER — FENTANYL 2.5 MCG/ML BUPIVACAINE 1/10 % EPIDURAL INFUSION (WH - ANES)
14.0000 mL/h | INTRAMUSCULAR | Status: DC | PRN
Start: 2014-05-20 — End: 2014-05-21
  Administered 2014-05-20 – 2014-05-21 (×2): 14 mL/h via EPIDURAL

## 2014-05-20 MED ORDER — CITRIC ACID-SODIUM CITRATE 334-500 MG/5ML PO SOLN
30.0000 mL | ORAL | Status: DC | PRN
  Administered 2014-05-20: 30 mL via ORAL
  Filled 2014-05-20: qty 15

## 2014-05-20 MED ORDER — LIDOCAINE HCL (PF) 1 % IJ SOLN
INTRAMUSCULAR | Status: DC | PRN
  Administered 2014-05-20: 10 mL

## 2014-05-20 MED ORDER — LACTATED RINGERS IV SOLN
INTRAVENOUS | Status: DC
Start: 2014-05-20 — End: 2014-05-23
  Administered 2014-05-20 – 2014-05-21 (×3): via INTRAVENOUS

## 2014-05-20 MED ORDER — LACTATED RINGERS IV SOLN
500.0000 mL | INTRAVENOUS | Status: DC | PRN
  Administered 2014-05-20: 500 mL via INTRAVENOUS

## 2014-05-20 MED ORDER — ONDANSETRON HCL 4 MG/2ML IJ SOLN: 4.0000 mg | Freq: Four times a day (QID) | INTRAMUSCULAR | Status: DC | PRN

## 2014-05-20 MED ORDER — OXYTOCIN BOLUS FROM INFUSION: 500.0000 mL | INTRAVENOUS | Status: DC

## 2014-05-20 MED ORDER — PHENYLEPHRINE 40 MCG/ML (10ML) SYRINGE FOR IV PUSH (FOR BLOOD PRESSURE SUPPORT)
80.0000 ug | PREFILLED_SYRINGE | INTRAVENOUS | Status: DC | PRN
  Filled 2014-05-20: qty 10
  Filled 2014-05-20: qty 2
  Filled 2014-05-20: qty 10

## 2014-05-20 MED ORDER — DEXTROSE 5 % IV SOLN
5.0000 10*6.[IU] | Freq: Once | INTRAVENOUS | Status: AC
  Administered 2014-05-20: 5 10*6.[IU] via INTRAVENOUS
  Filled 2014-05-20: qty 5

## 2014-05-20 MED ORDER — DIPHENHYDRAMINE HCL 50 MG/ML IJ SOLN: 12.5000 mg | INTRAMUSCULAR | Status: DC | PRN

## 2014-05-20 MED ORDER — LIDOCAINE HCL (PF) 1 % IJ SOLN
30.0000 mL | INTRAMUSCULAR | Status: DC | PRN
  Filled 2014-05-20: qty 30

## 2014-05-20 MED ORDER — EPHEDRINE 5 MG/ML INJ
10.0000 mg | INTRAVENOUS | Status: DC | PRN
  Filled 2014-05-20: qty 2

## 2014-05-20 MED ORDER — LACTATED RINGERS IV SOLN: 500.0000 mL | Freq: Once | INTRAVENOUS | Status: DC

## 2014-05-20 NOTE — Anesthesia Procedure Notes (Signed)

## 2014-05-20 NOTE — Progress Notes (Signed)
Mary Mitchell is a 18 y.o. [phone] at [redacted]w[redacted]d admitted for active labor  Subjective: Pt reports h/a, irregular contractions.  Family in room for support.  Objective: BP 124/60  Pulse 110  Temp(Src) 98 F (36.7 C) (Oral)  Resp 18  Ht  (1.6 m)  Wt 85.73 kg (189 lb)  BMI 33.49 kg/m2  LMP 08/14/2013   Total I/O In: -  Out: 800 [Emesis/NG output:800]  FHT:  FHR: 135 bpm, variability: moderate,  accelerations:  Present,  decelerations:  Absent UC:   irregular, every 3-6 minutes SVE:   Dilation: 4.5 Effacement (%): 50 Station: -3 Exam AV:WUJW Leftwich-Kirby, CNM  Labs: Lab Results  Component Value Date   WBC 6.6 05/20/2014   HGB 8.3* 05/20/2014   HCT 25.5* 05/20/2014   MCV 75.4* 05/20/2014   PLT 228 05/20/2014    Assessment / Plan: Protracted active phase  Labor: Pt to get epidural, then start Pitocin.  Pt reports labor has gone quickly with previous babies once Pitocin started. Preeclampsia:  n/a Fetal Wellbeing:  Category I Pain Control:  Labor support without medications I/D:  n/a Anticipated MOD:  NSVD  LEFTWICH-KIRBY, Mary Mitchell 05/20/2014, 11:31 PM

## 2014-05-20 NOTE — Anesthesia Preprocedure Evaluation (Signed)
Anesthesia Evaluation  Patient identified by MRN, date of birth, ID band Patient awake    Reviewed: Allergy & Precautions, H&P , Patient's Chart, lab work & pertinent test results  Airway Mallampati: II TM Distance: >3 FB Neck ROM: full    Dental  (+) Teeth Intact   Pulmonary asthma , Current Smoker,  breath sounds clear to auscultation        Cardiovascular Rhythm:regular Rate:Normal     Neuro/Psych    GI/Hepatic   Endo/Other    Renal/GU      Musculoskeletal   Abdominal   Peds  Hematology  (+) anemia ,   Anesthesia Other Findings       Reproductive/Obstetrics (+) Pregnancy                           Anesthesia Physical Anesthesia Plan  ASA: II  Anesthesia Plan: Epidural   Post-op Pain Management:    Induction:   Airway Management Planned:   Additional Equipment:   Intra-op Plan:   Post-operative Plan:   Informed Consent: I have reviewed the patients History and Physical, chart, labs and discussed the procedure including the risks, benefits and alternatives for the proposed anesthesia with the patient or authorized representative who has indicated his/her understanding and acceptance.   Dental Advisory Given  Plan Discussed with:   Anesthesia Plan Comments: (Labs checked- platelets confirmed with RN in room. Fetal heart tracing, per RN, reported to be stable enough for sitting procedure. Discussed epidural, and patient consents to the procedure:  included risk of possible headache,backache, failed block, allergic reaction, and nerve injury. This patient was asked if she had any questions or concerns before the procedure started.)        Anesthesia Quick Evaluation

## 2014-05-20 NOTE — H&P (Signed)
Mary Mitchell is a 18 y.o. female [phone] with IUP at [redacted]w[redacted]d presenting for contractions that are now occuring every 2-3 minutes which are very uncomforable. No LOF or vaginal bleeding. Good fetal movement.  [facility] since [redacted]w[redacted]d, however with numerous missed appointments.  EDD 05/21/14 by [redacted]w[redacted]d U/S c/w LMP  Prenatal History/Complications: GBS positive. Vaginal spotting with transition from antibody negative to antibody positive during this pregnancy. Poor outpt follow up.  01/17/14: Rh neg, Ab neg, 01/30/14: Rh neg, Ab pos  Past Medical History: Past Medical History  Diagnosis Date  . Asthma   . ADHD (attention deficit hyperactivity disorder)   . Bipolar 1 disorder   . PTSD (post-traumatic stress disorder)    Uses albuterol 2x day.   Past Surgical History: Past Surgical History  Procedure Laterality Date  . Abdominal hernia repair    . Hernia repair      Obstetrical History: OB History   Grav Para Term Preterm Abortions TAB SAB Ect Mult Living   0 0 0 0 0 0 3       Social History: History   Social History  . Marital Status: Single    Spouse Name: N/A    Number of Children: N/A  . Years of Education: N/A   Social History Main Topics  . Smoking status: Current Some Day Smoker -- 0.25 packs/day    Types: Cigarettes  . Smokeless tobacco: None  . Alcohol Use: No  . Drug Use: No  . Sexual Activity: Not Currently   Other Topics Concern  . None   Social History Narrative  . None    Family History: Family History  Problem Relation Age of Onset  . Diabetes Mother   . Hypertension Mother   . Diabetes Maternal Grandmother   . Hypertension Maternal Grandmother   . Heart disease Maternal Grandmother     Allergies: No Known Allergies  Prescriptions prior to admission  Medication Sig Dispense Refill  . acetaminophen (TYLENOL) 500 MG tablet Take 1,000 mg by mouth 2 (two) times daily as needed for mild pain or headache.      . albuterol (PROVENTIL  HFA;VENTOLIN HFA) 108 (90 BASE) MCG/ACT inhaler Inhale 1-2 puffs into the lungs every 6 (six) hours as needed for wheezing or shortness of breath.  1 Inhaler  3  . Prenatal Vit-Fe Fumarate-FA (PRENATAL MULTIVITAMIN) TABS tablet Take 1 tablet by mouth daily.          Review of Systems    Constitutional: No fever, chills, or fatigue   Blood pressure 124/60, pulse 110, temperature 98 F (36.7 C), temperature source Oral, resp. rate 18, height  (1.6 m), weight 85.73 kg (189 lb), last menstrual period 08/14/2013. General appearance: alert, cooperative and no distress Lungs: clear to auscultation bilaterally Heart: regular rate and rhythm Abdomen: soft, non-tender; bowel sounds normal Pelvic: deferred Extremities: Homans sign is negative, no sign of DVT DTR's 2+ Presentation: unsure Fetal monitoringBaseline: 130 bpm, Variability: Good {> 6 bpm), Accelerations: Reactive and Decelerations: Absent Uterine activity Irregular   Dilation: 4 Effacement (%): 40 Station: -3;-2 Exam by:: Dr. Karie Mainland  U/A: 100 protein, trace hemoglobin, large leukocytes, negative nitrites CBC Latest Ref Rng 05/20/2014 01/30/2014 01/17/2014  WBC 4.0 - 10.5 K/uL 6.6 6.7 6.6  Hemoglobin 12.0 - 15.0 g/dL 8.3(L) 10.6(L) 10.9(L)  Hematocrit 36.0 - 46.0 % 25.5(L) 31.4(L) 32.7(L)  Platelets 150 - 400 K/uL 228 265 205     Prenatal labs: ABO, Rh: A/NEG/-- (05/14  1022) Antibody: POS (05/14 1022) Rubella:  Immune  RPR: NON REAC (05/14 1022)  HBsAg: NEGATIVE (05/14 1022)  HIV: NONREACTIVE (05/14 1022)  GBS: Positive (08/06 0000)  1 hr Glucola 86 Genetic screening  Too late Anatomy US: Boy. At 22wk U/S 1lb 2oz, 47%   Clinic [facility]  Dating By LMP, consistent with 22 week sono  Genetic Screen  Too late  Anatomic US wnl  GTT 86 at 24 weeks then large lapse in care.  TDaP vaccine 04/24/14  Flu vaccine n/a  GBS positive  Contraception IUD  Baby Food bottle  Circumcision Boy--circumcision outpateint  Pediatrician    Support Person Baby daddy--Phillip Rennis Harding and mom    Prenatal Transfer Tool  Maternal Diabetes: No Genetic Screening: Declined Maternal Ultrasounds/Referrals: Normal Fetal Ultrasounds or other Referrals:  None Maternal Substance Abuse:  Yes:  Type: Smoker Significant Maternal Medications:  Meds include: Other: Albuterol  Significant Maternal Lab Results: Lab values include: Group B Strep positive    Results for orders placed during the hospital encounter of 05/20/14 (from the past 24 hour(s))  CBC   Collection Time    05/20/14  8:25 PM      Result Value Ref Range   WBC 6.6  4.0 - 10.5 K/uL   RBC 3.38 (*) 3.87 - 5.11 MIL/uL   Hemoglobin 8.3 (*) 12.0 - 15.0 g/dL   HCT 29.5 (*) 62.1 - 30.8 %   MCV 75.4 (*) 78.0 - 100.0 fL   MCH 24.6 (*) 26.0 - 34.0 pg   MCHC 32.5  30.0 - 36.0 g/dL   RDW 65.7  84.6 - 96.2 %   Platelets 228  150 - 400 K/uL    Assessment: Mary Mitchell is a 18 y.o. [id number] at [redacted]w[redacted]d by [redacted]w[redacted]d U/S c/w LMP #Labor: Will admit and start argumentation with pitocin   #Pain: May have an epidural on request #FWB: Cat 1 #ID:  GBS positive, started PCN #MOF: Bottle #MOC: IUD #Circ:  As an outpatient  Rodrigo Ran S 05/20/2014, 10:09 PM  I have seen this patient and agree with the above resident's note.  LEFTWICH-KIRBY, Ekin Pilar Certified Nurse-Midwife

## 2014-05-21 ENCOUNTER — Encounter (HOSPITAL_COMMUNITY): Payer: Self-pay

## 2014-05-21 DIAGNOSIS — O99344 Other mental disorders complicating childbirth: Secondary | ICD-10-CM

## 2014-05-21 DIAGNOSIS — O09899 Supervision of other high risk pregnancies, unspecified trimester: Secondary | ICD-10-CM

## 2014-05-21 LAB — CULTURE, OB URINE: Colony Count: 75000

## 2014-05-21 LAB — TYPE AND SCREEN
ABO/RH(D): A NEG
ANTIBODY SCREEN: NEGATIVE

## 2014-05-21 LAB — RPR

## 2014-05-21 MED ORDER — DIBUCAINE 1 % RE OINT: 1.0000 "application " | TOPICAL_OINTMENT | RECTAL | Status: DC | PRN

## 2014-05-21 MED ORDER — TETANUS-DIPHTH-ACELL PERTUSSIS 5-2.5-18.5 LF-MCG/0.5 IM SUSP: 0.5000 mL | Freq: Once | INTRAMUSCULAR | Status: DC

## 2014-05-21 MED ORDER — BUPIVACAINE HCL (PF) 0.5 % IJ SOLN
INTRAMUSCULAR | Status: DC | PRN
  Administered 2014-05-21: 2.5 mL via EPIDURAL

## 2014-05-21 MED ORDER — LANOLIN HYDROUS EX OINT: TOPICAL_OINTMENT | CUTANEOUS | Status: DC | PRN

## 2014-05-21 MED ORDER — ZOLPIDEM TARTRATE 5 MG PO TABS: 5.0000 mg | ORAL_TABLET | Freq: Every evening | ORAL | Status: DC | PRN

## 2014-05-21 MED ORDER — BUPIVACAINE HCL (PF) 0.25 % IJ SOLN
INTRAMUSCULAR | Status: DC | PRN
  Administered 2014-05-21 (×2): 5 mL via EPIDURAL
  Administered 2014-05-21: 2.5 mL via EPIDURAL

## 2014-05-21 MED ORDER — OXYCODONE-ACETAMINOPHEN 5-325 MG PO TABS
1.0000 | ORAL_TABLET | ORAL | Status: DC | PRN
  Administered 2014-05-22 – 2014-05-23 (×3): 1 via ORAL
  Filled 2014-05-21 (×8): qty 1

## 2014-05-21 MED ORDER — TERBUTALINE SULFATE 1 MG/ML IJ SOLN: 0.2500 mg | Freq: Once | INTRAMUSCULAR | Status: DC | PRN

## 2014-05-21 MED ORDER — PNEUMOCOCCAL VAC POLYVALENT 25 MCG/0.5ML IJ INJ
0.5000 mL | INJECTION | INTRAMUSCULAR | Status: DC
  Filled 2014-05-21: qty 0.5

## 2014-05-21 MED ORDER — BENZOCAINE-MENTHOL 20-0.5 % EX AERO
1.0000 "application " | INHALATION_SPRAY | CUTANEOUS | Status: DC | PRN
  Administered 2014-05-21: 1 via TOPICAL
  Filled 2014-05-21: qty 56

## 2014-05-21 MED ORDER — ONDANSETRON HCL 4 MG/2ML IJ SOLN: 4.0000 mg | INTRAMUSCULAR | Status: DC | PRN

## 2014-05-21 MED ORDER — IBUPROFEN 600 MG PO TABS
600.0000 mg | ORAL_TABLET | Freq: Four times a day (QID) | ORAL | Status: DC
  Administered 2014-05-21 – 2014-05-23 (×7): 600 mg via ORAL
  Filled 2014-05-21 (×7): qty 1

## 2014-05-21 MED ORDER — WITCH HAZEL-GLYCERIN EX PADS: 1.0000 "application " | MEDICATED_PAD | CUTANEOUS | Status: DC | PRN

## 2014-05-21 MED ORDER — ONDANSETRON HCL 4 MG PO TABS: 4.0000 mg | ORAL_TABLET | ORAL | Status: DC | PRN

## 2014-05-21 MED ORDER — OXYCODONE-ACETAMINOPHEN 5-325 MG PO TABS
2.0000 | ORAL_TABLET | ORAL | Status: DC | PRN
Start: 2014-05-21 — End: 2014-05-23
  Administered 2014-05-22 – 2014-05-23 (×2): 2 via ORAL
  Filled 2014-05-21 (×2): qty 2

## 2014-05-21 MED ORDER — OXYTOCIN 40 UNITS IN LACTATED RINGERS INFUSION - SIMPLE MED
1.0000 m[IU]/min | INTRAVENOUS | Status: DC
  Administered 2014-05-21: 2 m[IU]/min via INTRAVENOUS

## 2014-05-21 MED ORDER — DIPHENHYDRAMINE HCL 25 MG PO CAPS: 25.0000 mg | ORAL_CAPSULE | Freq: Four times a day (QID) | ORAL | Status: DC | PRN

## 2014-05-21 MED ORDER — SIMETHICONE 80 MG PO CHEW
80.0000 mg | CHEWABLE_TABLET | ORAL | Status: DC | PRN
Start: 2014-05-21 — End: 2014-05-23

## 2014-05-21 MED ORDER — SENNOSIDES-DOCUSATE SODIUM 8.6-50 MG PO TABS
2.0000 | ORAL_TABLET | ORAL | Status: DC
  Administered 2014-05-22 – 2014-05-23 (×2): 2 via ORAL
  Filled 2014-05-21 (×2): qty 2

## 2014-05-21 MED ORDER — FENTANYL CITRATE 0.05 MG/ML IJ SOLN
INTRAMUSCULAR | Status: AC
  Filled 2014-05-21: qty 2

## 2014-05-21 MED ORDER — FENTANYL CITRATE 0.05 MG/ML IJ SOLN
100.0000 ug | Freq: Once | INTRAMUSCULAR | Status: AC
  Administered 2014-05-21: 100 ug via EPIDURAL

## 2014-05-21 MED ORDER — PRENATAL MULTIVITAMIN CH
1.0000 | ORAL_TABLET | Freq: Every day | ORAL | Status: DC
  Administered 2014-05-22: 1 via ORAL
  Filled 2014-05-21: qty 1

## 2014-05-21 NOTE — Progress Notes (Signed)
Delivery of live viable female by Dr Loreta Ave. APGARs 9,9

## 2014-05-21 NOTE — Progress Notes (Signed)
Mary Mitchell is a 18 y.o. [phone] at [redacted]w[redacted]d by LMP admitted for early active labor.  Subjective:  Pt resting comfortably in bed. Has epidural in place. States she cannot feel contractions. No leakage of fluid.  Objective: BP 137/81  Pulse 94  Temp(Src) 98.4 F (36.9 C) (Oral)  Resp 18  Ht  (1.6 m)  Wt 189 lb (85.73 kg)  BMI 33.49 kg/m2  SpO2 98%  LMP 08/14/2013 I/O last 3 completed shifts: In: -  Out: 1050 [Urine:250; Emesis/NG output:800]    FHT:  FHR: 130 bpm, variability: moderate,  accelerations:  Present,  decelerations:  Absent UC:   regular, every 2 minutes SVE:   Dilation: 5 Effacement (%): 70 Station: -2 Exam by:: A AROM clear fluid, moderate amount IUPC placed.  Labs: Lab Results  Component Value Date   WBC 6.6 05/20/2014   HGB 8.3* 05/20/2014   HCT 25.5* 05/20/2014   MCV 75.4* 05/20/2014   PLT 228 05/20/2014    Assessment / Plan: Augmentation of labor, progressing well  Labor: AROM during this exam, IUPC placed. Will continue to monitor for adequate labor pattern and continue with augmentation at this time Preeclampsia:  no signs or symptoms of toxicity Fetal Wellbeing:  Category I Pain Control:  Epidural and Fentanyl I/D:  PCN Anticipated MOD:  NSVD  Morell Mears ROCIO 05/21/2014, 12:51 PM

## 2014-05-21 NOTE — Progress Notes (Signed)
Mary Mitchell is a 18 y.o. [phone] at [redacted]w[redacted]d admitted for active labor  Subjective: Pt comfortable with epidural.   Objective: BP 111/63  Pulse 97  Temp(Src) 98.6 F (37 C) (Oral)  Resp 18  Ht  (1.6 m)  Wt 85.73 kg (189 lb)  BMI 33.49 kg/m2  SpO2 98%  LMP 08/14/2013 I/O last 3 completed shifts: In: -  Out: 1050 [Urine:250; Emesis/NG output:800]    FHT:  FHR: 135 bpm, variability: moderate,  accelerations:  Present,  decelerations:  Absent UC:   regular, every 3-4 minutes SVE:   Dilation: 5 Effacement (%): 70 Station: -2 Exam by::Lisa Leftwich-Kirby, CNM  Labs: Lab Results  Component Value Date   WBC 6.6 05/20/2014   HGB 8.3* 05/20/2014   HCT 25.5* 05/20/2014   MCV 75.4* 05/20/2014   PLT 228 05/20/2014    Assessment / Plan: Augmentation of labor, progressing well  Labor: Progressing normally Preeclampsia:  n/a Fetal Wellbeing:  Category I Pain Control:  Epidural I/D:  n/a Anticipated MOD:  NSVD  LEFTWICH-KIRBY, LISA 05/21/2014, 8:15 AM

## 2014-05-21 NOTE — H&P (Signed)
Attestation of Attending Supervision of Advanced Practitioner (CNM/NP): Evaluation and management procedures were performed by the Advanced Practitioner under my supervision and collaboration.  I have reviewed the Advanced Practitioner's note and chart, and I agree with the management and plan.  HARRAWAY-[name], Katriona Schmierer 7:30 AM     

## 2014-05-21 NOTE — Progress Notes (Signed)
   Mary Mitchell is a 18 y.o. [phone] at [redacted]w[redacted]d  admitted for active labor  Subjective: Pt requested epidural prior to starting pitocin. Tolerating pain well.  Objective: Filed Vitals:   05/21/14 0255 05/21/14 0259 05/21/14 0300 05/21/14 0305  BP:   134/75   Pulse: 103 105 104 102  Temp:      TempSrc:      Resp:   20   Height:      Weight:      SpO2: 97%  99% 98%   Total I/O In: -  Out: 800 [Emesis/NG output:800]  FHT:  FHR: 130 bpm, variability: moderate,  accelerations:  Present,  decelerations:  Absent UC:   irregular, every 2-5 minutes SVE:  5/50/-2 vertex  Labs: Lab Results  Component Value Date   WBC 6.6 05/20/2014   HGB 8.3* 05/20/2014   HCT 25.5* 05/20/2014   MCV 75.4* 05/20/2014   PLT 228 05/20/2014    Assessment / Plan: Protracted active phase  Labor: Will augment with pitocin Fetal Wellbeing:  Category I Pain Control:  Epidural Anticipated MOD:  NSVD Continue PCN for GBS +  Rodrigo Ran S 05/21/2014, 3:28 AM

## 2014-05-22 ENCOUNTER — Other Ambulatory Visit: Payer: Medicaid Other

## 2014-05-22 MED ORDER — LIDOCAINE HCL (PF) 1 % IJ SOLN
5.0000 mL | Freq: Once | INTRAMUSCULAR | Status: DC
  Filled 2014-05-22: qty 5

## 2014-05-22 NOTE — Anesthesia Postprocedure Evaluation (Signed)
Anesthesia Post Note  Patient: Mary Mitchell  Procedure(s) Performed: * No procedures listed *  Anesthesia type: Epidural  Patient location: Mother/Baby  Post pain: Pain level controlled  Post assessment: Post-op Vital signs reviewed  Last Vitals:  Filed Vitals:   05/22/14 0700  BP: 132/88  Pulse: 93  Temp: 36.8 C  Resp: 18    Post vital signs: Reviewed  Level of consciousness:alert  Complications: No apparent anesthesia complications

## 2014-05-22 NOTE — Progress Notes (Signed)
Clinical Social Work Department PSYCHOSOCIAL ASSESSMENT - MATERNAL/CHILD 05/22/2014  Patient:  Mary Mitchell, Mary Mitchell  Account Number:  [id number]  [place] Date:  05/20/2014  [name] Name:   Mary Mitchell, "Mary Mitchell"   Clinical Social Worker:  Lucita Ferrara, CLINICAL SOCIAL WORKER   Date/Time:  05/21/2014 10:00 AM  Date Referred:  05/21/2014   Referral source  Central Nursery     Referred reason  Behavioral Health Issues   Other referral source:    I:  FAMILY / HOME ENVIRONMENT Child's legal guardian:  PARENT  Guardian - Name Mary Mitchell - Age [place] [address] [place], [place] [address]  Mary Mitchell  same as above   Other household support members/support persons Name Relationship DOB   Mary Mitchell DAUGHTER 51 years old  Mary Mitchell 72 years old  Mary Mitchell 18 year old   Other support:   [name] shared that the family members with whom she lives with are supportive and assist her to care for her other children.    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  She shared that she will not be returning to work at [employer] but will continue as a Quarry manager.   Financial resources:  Medicaid If Medicaid - County:  [place] / Grade:  N/A Music therapist / Child Services Coordination / Early Interventions:   Per [name], she currently has a [facility] referral.  [name] receptive to a referral for [employer].  Cultural issues impacting care:   None reported.    III  STRENGTHS Strengths  Adequate Resources  Home prepared for Child (including basic supplies)  Supportive family/friends   Strength comment:  [name] stated that the family is supportive, and comments highlight that her family provides majority of the care to her  children.   IV  RISK FACTORS AND CURRENT PROBLEMS Current Problem:  YES   Risk Factor & Current Problem Patient Issue Family Issue Risk Factor / Current Problem Comment  Mental Illness Y N [name] presents with history of bipolar and PTSD.  [name] is currently not on medications and is strongly against medications despite acute symptoms.    V  SOCIAL WORK ASSESSMENT CSW met with [name] in order to complete assessment. Consult ordered due to [name] presenting with a history of bipolar and PTSD.  [name] easily engaged in the assessment but presents with minimal motivation to address/treat her untreated mental health needs.  [name] presented as tearful throughout the assessment which she stated is consistent with her recent mood of being very emotional and tearful.   She continues to lack readiness to genuinely process her thoughts and feelings related to being a young mother and her bipolar.   [name] smiled as she expressed excitement that she "finally" had a son.  She stated that she has 3 daughters and explained that she was happy that she had a boy.  [name] shared impressions that her family is supportive and frequently help her with childcare when she is feeling overwhelmed or while she is working.   CSW continued to explore [name]'s thoughts and feelings as she prepares to return home.  [name] acknowledged feeling overwhelmed due to now having 4 children.  [name] shared that during her pregnancy she  often isolated herself from her family, wanted to sleep all day, and was crying more often during the day than not crying.  [name] discussed that it was difficult for her to care for her children while she was feeling depressed, and shared that her family assisted her frequently.  She admits that she often questions if she is "able to keep going" as a mother.  CSW validated her feelings of being overwhelmed, and continued to explore with [name] her maladaptive coping of isolating and internalizing feelings.  [name] admits that she is aware that  isolating and internalizing feelings are ineffective, and she is able to verbalize the importance of getting out of the home and communicating.    CSW continued to clarify [name]'s mental health history.  [name] acknowledged history of bipolar, and stated that she was first diagnosed at age 53-13.  She stated that she participated in therapy and medication management until age [age] when she decided that she no longer wanted to take medications or participate in therapy.  [name] admitted to a suicide attempt at this time as well which resulted in her receiving a charcoal due to overdose.  CSW continued to explore reasons that led to her decision to stop treatment, and she shared that she felt like "I could handle it on my own".  CSW reviewed her ability to effectively cope with her symptoms on her own, and [name] acknowledged that she is not coping well on her own.  CSW assisted [name] to explore her personal values and goals of importance.  [name] is able to verbalize goal of being "the best mother".  CSW assisted [name] to reflect back and look forward on how untreated depression/bipolar may impact her ability to achieve these goals.  [name] is able to verbalize that her untreated mental health needs negatively impact her ability to reach these goals.  CSW returned to [name]'s thoughts and feelings related to receiving treatment as she transitions into current postpartum period.  She continued to express hesitancy, but did accept referral to [facility] in the event that she wants to start medication management or therapy.   CSW normalized [name]'s avoidance patterns, including the natural tendency to push away from treatment that would make her confront her symptoms.  [name] is able to verbalize benefits of confronting issues that are related to her symptoms.  [name] is aware of her increased risk of postpartum depression and was receptive to education on signs/symptoms.  Even with awareness of increased risk, recent symptoms, and her ability to  verbalize how untreated mental health symptoms negative impact her ability to reach her goals, [name] continued to express hesitancy to receive treatment.   CSW offered ongoing emotional support PRN.  [name] verbalized understanding.   CSW spoke with Delray Alt and completed CPS report with [facility] due to [facility] presenting with untreated bipolar, including recent depressive episode that led to her isolating, spending all day in bed, and being highly tearful.     VI SOCIAL WORK PLAN Social Work Plan  Child Scientist, forensic Report  Information/Referral to [employer]  Patient/Family Education   Type of pt/family education:   Postpartum depression and anxiety   If child protective services report - county:  [place] If child protective services report - date:  05/22/2014 Information/referral to community resources comment:   CSW to complete referral for [employer].   Other social work plan:   CSW to provide ongoing emotional support PRN. CSW to follow-up with CPS for recommendations and to discuss  dishcarge plans.

## 2014-05-22 NOTE — Progress Notes (Signed)
CSW confirmed that CPS has accepted the report.  Ladona Horns has been the assigned worker.  CSW left voicemail for worker and requested a call back in order to collaborate on the case.

## 2014-05-22 NOTE — Progress Notes (Signed)
UR completed 

## 2014-05-22 NOTE — Lactation Note (Signed)
This note was copied from the chart of Mary Mitchell. Lactation Consultation Note  Patient Name: Mary Mitchell WGNFA'O Date: 05/22/2014 Reason for consult: Other (Comment) (charting for exclusion)   Maternal Data Formula Feeding for Exclusion: Yes Reason for exclusion: Mother's choice to formula feed on admision  Feeding    LATCH Score/Interventions                      Lactation Tools Discussed/Used     Consult Status Consult Status: Complete    Lynda Rainwater 05/22/2014, 3:22 PM

## 2014-05-22 NOTE — Progress Notes (Signed)
I have seen and examined this patient and I agree with the above. Cam Hai CNM 8:50 AM 05/22/2014

## 2014-05-22 NOTE — Addendum Note (Signed)
Addendum created 05/22/14 1001 by Algis Greenhouse, CRNA   Modules edited: Charges VN, Notes Section   Notes Section:  File: [id number]

## 2014-05-22 NOTE — Progress Notes (Signed)
Post Partum Day 1 s/p NSVD Subjective: no complaints, up ad lib, voiding, tolerating PO and + flatus Lochia greater than menses. Afebrile overnight. Pain is well controlled with pain meds.   Objective: Blood pressure 132/88, pulse 93, temperature 98.2 F (36.8 C), temperature source Oral, resp. rate 18, height  (1.6 m), weight 85.73 kg (189 lb), last menstrual period 08/14/2013, SpO2 92.00%, unknown if currently breastfeeding.  Physical Exam:  General: alert Lochia: appropriate Uterine Fundus: firm Incision: n/a DVT Evaluation: No evidence of DVT seen on physical exam.   Recent Labs  05/20/14 2025  HGB 8.3*  HCT 25.5*    Assessment/Plan: Plan for discharge tomorrow Plan for nexplanon insertion today.  Monitor bleeding closely.   LOS: 2 days   Dorathy Kinsman 05/22/2014, 8:44 AM

## 2014-05-23 MED ORDER — IBUPROFEN 600 MG PO TABS: 600.0000 mg | ORAL_TABLET | Freq: Four times a day (QID) | ORAL | Status: DC

## 2014-05-23 NOTE — Discharge Instructions (Signed)

## 2014-05-23 NOTE — Discharge Summary (Signed)
Obstetric Discharge Summary Reason for Admission: onset of labor Prenatal Procedures: none Intrapartum Procedures: spontaneous vaginal delivery and GBS prophylaxis Postpartum Procedures: none Complications-Operative and Postpartum: none Hemoglobin  Date Value Ref Range Status  05/20/2014 8.3* 12.0 - 15.0 g/dL Final     HCT  Date Value Ref Range Status  05/20/2014 25.5* 36.0 - 46.0 % Final    Discharge Diagnoses: Term Pregnancy-delivered  Hospital Course:  Mary Mitchell is a 18 y.o. [id number] who presented with SOL.  She had a uncomplicated SVD. Due to a history of bipolar and PTSD untreated, CSW was consulted and a CPS referral was made She was able to ambulate, tolerate PO and void normally. She was discharged home with instructions for postpartum care.    Delivery Note At 1:32 PM a viable female was delivered via (Presentation: LOA ). APGAR: 9,9  Placenta status: intact, . Cord: 3 vessel with the following complications:none   Anesthesia: Epidural  Episiotomy: none  Lacerations: none  Suture Repair: n/a  Est. Blood Loss (mL): 250  Mom to postpartum. Baby to Couplet care / Skin to Skin.  Physical Exam:  General: alert, cooperative and no distress Lochia: appropriate Uterine Fundus: firm DVT Evaluation: No evidence of DVT seen on physical exam. Negative Homan's sign. No cords or calf tenderness.  Discharge Information: Date: 05/23/2014 Activity: pelvic rest Diet: routine Medications: PNV and Ibuprofen Baby feeding: plans to bottle feed Contraception: Still unsure Condition: stable Instructions: refer to practice specific booklet Discharge to: home Follow-up Information   Call [facility]. (6 week follow-up appt)    Specialty:  Obstetrics and Gynecology   Contact information:   [address] [place] [address] [phone]      Newborn Data: Live born female  Birth Weight: 7 lb 13 oz (3544 g) APGAR: 9, 9  Home with mother.  Rodrigo Ran, MD [facility] PGY-1 05/23/2014, 7:54 AM  I have seen and examined this patient and agree the above assessment. CRESENZO-DISHMAN,[name] 05/26/2014 1:31 PM

## 2014-05-23 NOTE — Progress Notes (Signed)
CSW spoke with CPS worker who met with [name] and FOB on 9/3.  Per CPS, there are no barriers to discharge.  CPS will continue to follow-up with the family at discharge.    No barriers to discharge.  

## 2014-05-24 ENCOUNTER — Encounter (HOSPITAL_COMMUNITY): Payer: Self-pay | Admitting: *Deleted

## 2014-05-24 ENCOUNTER — Inpatient Hospital Stay (HOSPITAL_COMMUNITY)
Admission: AD | Admit: 2014-05-24 | Discharge: 2014-05-25 | Disposition: A | Discharge status: Home / Self Care | Payer: Medicaid Other | Source: Ambulatory Visit | Attending: Family Medicine | Admitting: Family Medicine

## 2014-05-24 DIAGNOSIS — O9279 Other disorders of lactation: Secondary | ICD-10-CM | POA: Insufficient documentation

## 2014-05-24 DIAGNOSIS — O99335 Smoking (tobacco) complicating the puerperium: Secondary | ICD-10-CM | POA: Insufficient documentation

## 2014-05-24 NOTE — MAU Provider Note (Signed)
History     CSN: [id number]  Arrival date and time: 05/24/14 2152   First Provider Initiated Contact with Patient 05/24/14 2302      Chief Complaint  Patient presents with  . Breast Pain   HPI  Pt is a [id number] here 3 days postpartum with report of breast engorgement.  Not currently breastfeeding, but states will if it will help relieve pain.  Pt states has used ice, cold showers, tight fitting bra with no relief.  Pt denies fever, body aches or chills.     Past Medical History  Diagnosis Date  . Asthma   . ADHD (attention deficit hyperactivity disorder)   . Bipolar 1 disorder   . PTSD (post-traumatic stress disorder)     Past Surgical History  Procedure Laterality Date  . Abdominal hernia repair    . Hernia repair      Family History  Problem Relation Age of Onset  . Diabetes Mother   . Hypertension Mother   . Diabetes Maternal Grandmother   . Hypertension Maternal Grandmother   . Heart disease Maternal Grandmother     History  Substance Use Topics  . Smoking status: Current Some Day Smoker -- 0.25 packs/day    Types: Cigarettes  . Smokeless tobacco: Not on file  . Alcohol Use: No    Allergies: No Known Allergies  Prescriptions prior to admission  Medication Sig Dispense Refill  . acetaminophen (TYLENOL) 500 MG tablet Take 1,000 mg by mouth 2 (two) times daily as needed for mild pain or headache.      . albuterol (PROVENTIL HFA;VENTOLIN HFA) 108 (90 BASE) MCG/ACT inhaler Inhale 1-2 puffs into the lungs every 6 (six) hours as needed for wheezing or shortness of breath.  1 Inhaler  3  . ibuprofen (ADVIL,MOTRIN) 600 MG tablet Take 1 tablet (600 mg total) by mouth every 6 (six) hours.  30 tablet  0  . Prenatal Vit-Fe Fumarate-FA (PRENATAL MULTIVITAMIN) TABS tablet Take 1 tablet by mouth daily.         Review of Systems  Constitutional: Negative for fever and chills.  Cardiovascular:       Breast pain   Physical Exam   Blood pressure 148/91, pulse 100,  temperature 99 F (37.2 C), temperature source Oral, resp. rate 18, height  (1.6 m), weight 82.373 kg (181 lb 9.6 oz), last menstrual period 08/14/2013, not currently breastfeeding.  Physical Exam  Constitutional: She is oriented to person, place, and time. She appears well-developed and well-nourished.  HENT:  Head: Normocephalic.  Neck: Normal range of motion. Neck supple.  Cardiovascular: Normal rate, regular rhythm and normal heart sounds.   Respiratory: Effort normal and breath sounds normal. Left breast exhibits tenderness.  Bilat breast engorgement; no dominant masses palpated  GI: Soft. There is no tenderness.  Genitourinary: Bleeding: scant; dark red; no bright red or brisk bleed  Vaginal discharge: vaginal bleeding.  Neurological: She is alert and oriented to person, place, and time. She has normal reflexes.  Skin: Skin is warm and dry.    MAU Course  Procedures  Pt utilizes breast pump in MAU with some relief > reports need to leave because her ride has to work in the morning  Assessment and Plan  Breast Engorgement  Plan: Discharge to home Breastfeed baby often Warm compresses. Report fever, body aches or chills - explain symptoms and mastitis and sequelae if not treated Follow-up appt in [facility] Wed, 2 pm to reassess breast/worsening of symptoms  Rochele Pages N 05/24/2014, 11:04 PM

## 2014-05-24 NOTE — MAU Note (Signed)
Pt reports she delivered on  05/21/14. Breast started hurting yesterday. Not  Breast feeding.

## 2014-05-25 DIAGNOSIS — O9279 Other disorders of lactation: Secondary | ICD-10-CM

## 2014-05-25 DIAGNOSIS — O99335 Smoking (tobacco) complicating the puerperium: Secondary | ICD-10-CM | POA: Diagnosis not present

## 2014-05-25 DIAGNOSIS — N644 Mastodynia: Secondary | ICD-10-CM | POA: Diagnosis present

## 2014-05-25 NOTE — Discharge Instructions (Signed)
Breast Engorgement  Breast engorgement is the overfilling of your breasts with breast milk. In the first few weeks after giving birth, you may experience breast engorgement. Although it is normal for your breasts to feel heavy, full, and uncomfortable within 3-5 days of giving birth, breast engorgement can make your breasts throb and feel hard, tightly stretched, warm, and tender. Engorgement peaks about the fifth day after you give birth. Breast engorgement can be easily treated and does not require you to stop breastfeeding.  CAUSES OF BREAST ENGORGEMENT Some women delay feedings because of sore or cracked nipples, which can lead to engorgement. Cracked and sore nipples often are caused by inadequate latching (when your baby's mouth attaches to your breast to breastfeed). If your baby is latched on properly, he or she should be able to breastfeed as long as needed, without causing any pain. If you do feel pain while breastfeeding, take your baby off your breast and try again. Get help from your health care provider or a lactation consultant if you continue to have pain. Other causes of engorgement include:   Improper position of your baby while breastfeeding.  Allowing too much time to pass between feedings.  Reduction in breastfeeding because you give your baby water, juice, formula, breast milk from a bottle, or a pacifier instead of breastfeeding.  Changes in your baby's feeding patterns.  Weak sucking from your baby, which causes less milk to be taken out of your breast during feedings.  Fatigue, stress, anemia.  Plugged milk ducts.  A history of breast surgery. SIGNS AND SYMPTOMS OF BREAST ENGORGEMENT If your breasts become engorged, you may experience:   Breast swelling, tenderness, warmth, redness, or throbbing.  Breast hardness and stretching of the skin around your breast.  Flattening, tightening, and hardening of your nipple.  A low-grade fever, which can be confused with a  breast infection. RECOMMENDATIONS TO EASE BREAST ENGORGEMENT Breast engorgement should improve in 24-48 hours after following these recommendations:  Breastfeed when you feel the need to reduce the fullness of your breasts or when your baby shows signs of hunger. This is called "breastfeeding on demand."  Newborns (babies younger than 4 weeks) often breastfeed every 1-3 hours during the day. You may need to awaken your baby to feed if he or she is asleep at a feeding time.  Do not allow your baby to sleep longer than 5 hours during the night without a feeding.  Pump or hand-express breast milk before breastfeeding to soften your breast, areola, and nipple.   Apply warm, moist heat (in the shower or with warm water-soaked hand towels) just before feeding or pumping, or massage your breast before or during breastfeeding. This increases circulation and helps your milk to flow.  Completely empty your breasts when breastfeeding or pumping. Afterward, wear a snug bra (nursing or regular) or tank top for 1-2 days to signal your body to slightly decrease milk production. Only wear snug bras or tank tops to treat engorgement. Tight bras typically should be avoided by breastfeeding mothers. Once engorgement is relieved, return to wearing regular, loose-fitting clothes.  Apply ice packs to your breasts to lessen the pain from engorgement and relieve swelling, unless the ice is uncomfortable for you.  Do not delay feedings. Try to relax when it is time to feed your baby. This helps to trigger your "let-down reflex," which releases milk from your breast.  Ensure your baby is latched on to your breast and positioned properly while breastfeeding.     Allow your baby to remain at your breast as long as he or she is latched on well and actively sucking. Your baby will let you know when he or she is done breastfeeding by pulling away from your breast or falling asleep.  Avoid introducing bottles or pacifiers  to your baby in the early weeks of breastfeeding. Wait to introduce these things until after resolving any breastfeeding challenges.  Try to pump your milk on the same schedule as when your baby would breastfeed if you are returning to work or away from home for an extended period.  Drink plenty of fluids to avoid dehydration, which can eventually put you at greater risk of breast engorgement.  CALL YOUR HEALTH CARE PROVIDER OR LACTATION CONSULTANT IF:   Engorgement lasts longer than 2 days, even after treatment.  You have flu-like symptoms, such as a fever, chills, or body aches.  Your breasts become increasingly red and painful. Document Released: 12/31/2004 Document Revised: 05/08/2013 Document Reviewed: 02/28/2013 Exit[employer] Patient Information 2015 [employer]. This information is not intended to replace advice given to you by your health care provider. Make sure you discuss any questions you have with your health care provider.  

## 2014-05-25 NOTE — MAU Provider Note (Signed)
Attestation of Attending Supervision of Advanced Practitioner (PA/CNM/NP): Evaluation and management procedures were performed by the Advanced Practitioner under my supervision and collaboration.  I have reviewed the Advanced Practitioner's note and chart, and I agree with the management and plan.  Pihu Basil S, MD [facility] Attending 05/25/2014 7:00 AM   

## 2014-05-25 NOTE — MAU Note (Signed)
Pt expressed intrest in pumping to relieve engorgement. instructed pt on use and assisted with breat pump. Pt pumped for about 15 min and expressed 6 oz breast milk. Didn't like electric pump but tried hand pump for a few more minutes. Then stated she needed to leave because her ride was going to leave. Instructed on use of hand pump. And given referral number to lactation

## 2014-05-27 ENCOUNTER — Other Ambulatory Visit: Payer: Medicaid Other

## 2014-05-28 ENCOUNTER — Ambulatory Visit: Payer: Medicaid Other | Admitting: Obstetrics & Gynecology

## 2014-05-30 ENCOUNTER — Ambulatory Visit: Payer: Self-pay | Admitting: Pediatrics

## 2014-06-20 ENCOUNTER — Institutional Professional Consult (permissible substitution): Payer: Medicaid Other | Admitting: Pediatrics

## 2014-07-02 ENCOUNTER — Ambulatory Visit: Payer: Medicaid Other | Admitting: Obstetrics & Gynecology

## 2014-07-16 ENCOUNTER — Encounter: Payer: Self-pay | Admitting: Obstetrics & Gynecology

## 2014-07-16 ENCOUNTER — Ambulatory Visit: Payer: Medicaid Other | Admitting: Obstetrics & Gynecology

## 2014-07-16 ENCOUNTER — Telehealth: Payer: Self-pay | Admitting: General Practice

## 2014-07-16 NOTE — Telephone Encounter (Signed)
Patient no showed for appt. Called patient and she states she missed appt because she had to work but would like appt rescheduled. Told patient I will let our front office know and they will contact her with a new appt. Patient verbalized understanding and had no other questions

## 2014-07-17 ENCOUNTER — Encounter: Payer: Self-pay | Admitting: Obstetrics & Gynecology

## 2014-07-21 ENCOUNTER — Encounter (HOSPITAL_COMMUNITY): Payer: Self-pay | Admitting: *Deleted

## 2014-08-20 ENCOUNTER — Ambulatory Visit: Payer: Medicaid Other | Admitting: Obstetrics & Gynecology

## 2014-08-21 ENCOUNTER — Ambulatory Visit (INDEPENDENT_AMBULATORY_CARE_PROVIDER_SITE_OTHER): Payer: Medicaid Other | Admitting: Obstetrics and Gynecology

## 2014-08-21 DIAGNOSIS — Z3201 Encounter for pregnancy test, result positive: Secondary | ICD-10-CM

## 2014-08-21 LAB — POCT PREGNANCY, URINE: PREG TEST UR: POSITIVE — AB

## 2014-08-21 NOTE — Progress Notes (Signed)
Pt has +UPT today with sure LMP 07/03/14.Marland Kitchen. EDD  04/09/15.   Pt desires PNC here.

## 2014-08-22 ENCOUNTER — Encounter (HOSPITAL_COMMUNITY): Payer: Self-pay | Admitting: *Deleted

## 2014-08-22 ENCOUNTER — Inpatient Hospital Stay (HOSPITAL_COMMUNITY)
Admission: AD | Admit: 2014-08-22 | Discharge: 2014-08-22 | Disposition: A | Discharge status: Home / Self Care | Payer: Medicaid Other | Source: Ambulatory Visit | Attending: Obstetrics and Gynecology | Admitting: Obstetrics and Gynecology

## 2014-08-22 DIAGNOSIS — Z3A01 Less than 8 weeks gestation of pregnancy: Secondary | ICD-10-CM | POA: Diagnosis not present

## 2014-08-22 DIAGNOSIS — O210 Mild hyperemesis gravidarum: Secondary | ICD-10-CM | POA: Diagnosis not present

## 2014-08-22 DIAGNOSIS — R519 Headache, unspecified: Secondary | ICD-10-CM

## 2014-08-22 DIAGNOSIS — F17210 Nicotine dependence, cigarettes, uncomplicated: Secondary | ICD-10-CM | POA: Diagnosis not present

## 2014-08-22 DIAGNOSIS — O99331 Smoking (tobacco) complicating pregnancy, first trimester: Secondary | ICD-10-CM | POA: Insufficient documentation

## 2014-08-22 DIAGNOSIS — R51 Headache: Secondary | ICD-10-CM | POA: Diagnosis present

## 2014-08-22 LAB — URINALYSIS, ROUTINE W REFLEX MICROSCOPIC
BILIRUBIN URINE: NEGATIVE
Glucose, UA: NEGATIVE mg/dL
HGB URINE DIPSTICK: NEGATIVE
KETONES UR: NEGATIVE mg/dL
Leukocytes, UA: NEGATIVE
Nitrite: NEGATIVE
Protein, ur: NEGATIVE mg/dL
SPECIFIC GRAVITY, URINE: 1.025 (ref 1.005–1.030)
Urobilinogen, UA: 0.2 mg/dL (ref 0.0–1.0)
pH: 6 (ref 5.0–8.0)

## 2014-08-22 MED ORDER — METOCLOPRAMIDE HCL 5 MG/ML IJ SOLN: 10.0000 mg | Freq: Once | INTRAMUSCULAR | Status: DC

## 2014-08-22 MED ORDER — BUTALBITAL-APAP-CAFFEINE 50-325-40 MG PO TABS: 1.0000 | ORAL_TABLET | Freq: Four times a day (QID) | ORAL | Status: DC | PRN

## 2014-08-22 MED ORDER — BUTALBITAL-APAP-CAFFEINE 50-325-40 MG PO TABS
1.0000 | ORAL_TABLET | Freq: Once | ORAL | Status: AC
  Administered 2014-08-22: 1 via ORAL

## 2014-08-22 MED ORDER — DEXAMETHASONE SODIUM PHOSPHATE 10 MG/ML IJ SOLN: 10.0000 mg | Freq: Once | INTRAMUSCULAR | Status: DC

## 2014-08-22 MED ORDER — SODIUM CHLORIDE 0.9 % IV SOLN: INTRAVENOUS | Status: DC

## 2014-08-22 MED ORDER — DIPHENHYDRAMINE HCL 50 MG/ML IJ SOLN: 25.0000 mg | Freq: Once | INTRAMUSCULAR | Status: DC

## 2014-08-22 NOTE — MAU Provider Note (Signed)
History     CSN: [id number]  Arrival date and time: 08/22/14 1559   First Provider Initiated Contact with Patient 08/22/14 1636      Chief Complaint  Patient presents with  . Headache  . Dizziness   HPI  [name] is a 18 y.o. G5P4004 at 1974w1d who presents to MAU today with complaint of HA off and on x 3 days, but "strong" x 2 days. She states pain is 10/10 and she has occasional blurred vision. She states pain is unilateral to the right temporal region. She denies a history of headaches or migraines. She states N/V related to pregnancy. She denies any head trauma, but requests an X-ray. She tried Tylenol today without relief. She denies vaginal bleeding. Patient receives prenatal care with [facility].   OB History    Gravida Para Term Preterm AB TAB SAB Ectopic Multiple Living   5 4 4  0 0 0 0 0 0 4      Past Medical History  Diagnosis Date  . Asthma   . ADHD (attention deficit hyperactivity disorder)   . Bipolar 1 disorder   . PTSD (post-traumatic stress disorder)     Past Surgical History  Procedure Laterality Date  . Abdominal hernia repair    . Hernia repair      Family History  Problem Relation Age of Onset  . Diabetes Mother   . Hypertension Mother   . Diabetes Maternal Grandmother   . Hypertension Maternal Grandmother   . Heart disease Maternal Grandmother     History  Substance Use Topics  . Smoking status: Current Some Day Smoker -- 0.25 packs/day    Types: Cigarettes  . Smokeless tobacco: Not on file  . Alcohol Use: No    Allergies: No Known Allergies  Prescriptions prior to admission  Medication Sig Dispense Refill Last Dose  . acetaminophen (TYLENOL) 500 MG tablet Take 1,000 mg by mouth 2 (two) times daily as needed for mild pain or headache.   08/22/2014 at Unknown time  . albuterol (PROVENTIL HFA;VENTOLIN HFA) 108 (90 BASE) MCG/ACT inhaler Inhale 1-2 puffs into the lungs every 6 (six) hours as needed for wheezing or shortness of breath.  1 Inhaler 3 08/22/2014 at Unknown time  . ibuprofen (ADVIL,MOTRIN) 600 MG tablet Take 1 tablet (600 mg total) by mouth every 6 (six) hours. (Patient not taking: Reported on 08/22/2014) 30 tablet 0     Review of Systems  Constitutional: Negative for fever and malaise/fatigue.  Gastrointestinal: Positive for nausea and vomiting. Negative for abdominal pain.  Genitourinary: Negative for dysuria, urgency and frequency.       Neg - vaginal bleeding, discharge  Neurological: Positive for headaches.   Physical Exam   Blood pressure 132/69, pulse 86, temperature 98.9 F (37.2 C), temperature source Oral, resp. rate 16, height 5' 2.5" (1.588 m), weight 186 lb (84.369 kg), last menstrual period 07/03/2014, SpO2 100 %, not currently breastfeeding.  Physical Exam  Constitutional: She is oriented to person, place, and time. She appears well-developed and well-nourished. No distress.  HENT:  Head: Normocephalic and atraumatic.  Eyes: Conjunctivae and EOM are normal.  Neck: Normal range of motion.  Cardiovascular: Normal rate.   Respiratory: Effort normal.  Neurological: She is alert and oriented to person, place, and time.  Skin: Skin is warm and dry.  Psychiatric: She has a normal mood and affect.   Results for orders placed or performed during the hospital encounter of 08/22/14 (from the past 24 hour(s))  Urinalysis, Routine w reflex microscopic     Status: None   Collection Time: 08/22/14  4:00 PM  Result Value Ref Range   Color, Urine YELLOW YELLOW   APPearance CLEAR CLEAR   Specific Gravity, Urine 1.025 1.005 - 1.030   pH 6.0 5.0 - 8.0   Glucose, UA NEGATIVE NEGATIVE mg/dL   Hgb urine dipstick NEGATIVE NEGATIVE   Bilirubin Urine NEGATIVE NEGATIVE   Ketones, ur NEGATIVE NEGATIVE mg/dL   Protein, ur NEGATIVE NEGATIVE mg/dL   Urobilinogen, UA 0.2 0.0 - 1.0 mg/dL   Nitrite NEGATIVE NEGATIVE   Leukocytes, UA NEGATIVE NEGATIVE    MAU Course  Procedures None  MDM UA today shows no  signs of dehydration Fioricet given in MAU for headache - patient states resolution of headache pain  Assessment and Plan  A: SIUP at 8642w1d Headache Nausea and vomiting in pregnancy prior to [redacted] week gestation  P: Discharge home Rx for Fioricet given to patient Patient advised to increase PO hydration as tolerated Patient encouraged to follow-up with [facility] as scheduled for routine prenatal care Patient may return to MAU as needed or if her condition were to change or worsen   Marny LowensteinJulie N Swati Granberry, PA-C  08/22/2014, 5:07 PM

## 2014-08-22 NOTE — MAU Note (Addendum)
Had marked cough, diarrhea, n/v and abd pain on sign in sheet.  Pt denies fever.  States she feels n/v are related to preg.  Pt is a smoker- very strong presence of cigarette smoke noted on her person. Denies anyone else at home is sick.  States always has nausea, vomiting and diarrhea when she is preg.  Holding a soda, chip pkg and cookies when she walked in.  Not currently nauseated

## 2014-08-22 NOTE — Discharge Instructions (Signed)

## 2014-08-22 NOTE — MAU Note (Addendum)
Headache (right side of head) started 3 days, tried Tylenol- no relief. Getting worse,  Feeling dizzy/ light headed.  Pt at clinic yesterday- asked if she had mentioned it while there..... Stated it started after she left, she had a HA before - but that one went away

## 2014-08-22 NOTE — Progress Notes (Signed)
Patient ID: Mary Mitchell, female   DOB: 1996/05/28, 18 y.o.   MRN: [id number] Agree with nurses's documentation of this patient's clinic encounter.  Catalina AntiguaPeggy Lew Prout, MD

## 2014-09-16 ENCOUNTER — Encounter: Payer: Medicaid Other | Admitting: Family Medicine

## 2014-09-19 NOTE — L&D Delivery Note (Signed)
Delivery Note At 11:41 AM a viable female was delivered via Vaginal, Spontaneous Delivery.  APGAR: , 9; weight  pending.   Placenta status: Intact, Spontaneous.  Cord: 3 vessels with the following complications: None.  Anesthesia: Epidural  Episiotomy: None Lacerations: None Est. Blood Loss (mL): 60  Mom to postpartum.  Baby to Couplet care / Skin to Skin.  Lowanda Foster 04/14/2015, 12:45 PM  Delivery performed by Lyndel Safe, MD, Ob Fellow. I was present for the entire delivery of baby and placenta and inspection of perineum and agree with above. Placenta to: BS Feeding: Bottle Circ: NA Contraception: Laymond Purser, CNM 04/14/2015 12:53 PM

## 2014-09-23 ENCOUNTER — Inpatient Hospital Stay (HOSPITAL_COMMUNITY)
Admission: AD | Admit: 2014-09-23 | Discharge: 2014-09-24 | Disposition: A | Discharge status: Home / Self Care | Payer: Medicaid Other | Source: Ambulatory Visit | Attending: Obstetrics & Gynecology | Admitting: Obstetrics & Gynecology

## 2014-09-23 ENCOUNTER — Encounter (HOSPITAL_COMMUNITY): Payer: Self-pay

## 2014-09-23 DIAGNOSIS — F17210 Nicotine dependence, cigarettes, uncomplicated: Secondary | ICD-10-CM | POA: Diagnosis not present

## 2014-09-23 DIAGNOSIS — Z3A11 11 weeks gestation of pregnancy: Secondary | ICD-10-CM | POA: Insufficient documentation

## 2014-09-23 DIAGNOSIS — O210 Mild hyperemesis gravidarum: Secondary | ICD-10-CM | POA: Diagnosis present

## 2014-09-23 DIAGNOSIS — O99331 Smoking (tobacco) complicating pregnancy, first trimester: Secondary | ICD-10-CM | POA: Insufficient documentation

## 2014-09-23 DIAGNOSIS — O219 Vomiting of pregnancy, unspecified: Secondary | ICD-10-CM

## 2014-09-23 LAB — URINALYSIS, ROUTINE W REFLEX MICROSCOPIC
Bilirubin Urine: NEGATIVE
GLUCOSE, UA: NEGATIVE mg/dL
Hgb urine dipstick: NEGATIVE
Ketones, ur: 40 mg/dL — AB
Leukocytes, UA: NEGATIVE
NITRITE: NEGATIVE
Protein, ur: NEGATIVE mg/dL
SPECIFIC GRAVITY, URINE: 1.025 (ref 1.005–1.030)
Urobilinogen, UA: 1 mg/dL (ref 0.0–1.0)
pH: 6 (ref 5.0–8.0)

## 2014-09-23 MED ORDER — PROMETHAZINE HCL 25 MG PO TABS: 12.5000 mg | ORAL_TABLET | Freq: Once | ORAL | Status: DC

## 2014-09-23 MED ORDER — BUTALBITAL-APAP-CAFFEINE 50-325-40 MG PO TABS
2.0000 | ORAL_TABLET | Freq: Once | ORAL | Status: AC
  Administered 2014-09-23: 2 via ORAL
  Filled 2014-09-23: qty 2

## 2014-09-23 MED ORDER — PROMETHAZINE HCL 25 MG PO TABS
25.0000 mg | ORAL_TABLET | Freq: Once | ORAL | Status: AC
  Administered 2014-09-23: 25 mg via ORAL
  Filled 2014-09-23: qty 1

## 2014-09-23 NOTE — MAU Note (Signed)
Nauseated, vomited twice today. Last time vomited, had trouble catching her breath. Feels "off" all over, can't describe. Denies vaginal bleeding.

## 2014-09-23 NOTE — Discharge Instructions (Signed)

## 2014-09-23 NOTE — MAU Provider Note (Signed)
History     CSN: [id number]  Arrival date and time: 09/23/14 2229   First Provider Initiated Contact with Patient 09/23/14 2306      Chief Complaint  Patient presents with  . Emesis During Pregnancy  . Abdominal Pain   HPI Mary Mitchell is a 19 y.o. [id number] at [redacted]w[redacted]d who presents today via ems with nausea and vomiting. She states that she has vomited twice today. She has not been vomiting on a regular basis with the pregnancy. She has not been given any meds at this time. She denies any abdominal pain or vaginal bleeding.   Past Medical History  Diagnosis Date  . Asthma   . ADHD (attention deficit hyperactivity disorder)   . Bipolar 1 disorder   . PTSD (post-traumatic stress disorder)     Past Surgical History  Procedure Laterality Date  . Abdominal hernia repair    . Hernia repair      Family History  Problem Relation Age of Onset  . Diabetes Mother   . Hypertension Mother   . Diabetes Maternal Grandmother   . Hypertension Maternal Grandmother   . Heart disease Maternal Grandmother     History  Substance Use Topics  . Smoking status: Current Some Day Smoker -- 0.25 packs/day    Types: Cigarettes  . Smokeless tobacco: Not on file  . Alcohol Use: No    Allergies: No Known Allergies  Prescriptions prior to admission  Medication Sig Dispense Refill Last Dose  . acetaminophen (TYLENOL) 500 MG tablet Take 1,000 mg by mouth 2 (two) times daily as needed for mild pain or headache.   Past Week at Unknown time  . albuterol (PROVENTIL HFA;VENTOLIN HFA) 108 (90 BASE) MCG/ACT inhaler Inhale 1-2 puffs into the lungs every 6 (six) hours as needed for wheezing or shortness of breath. 1 Inhaler 3 08/22/2014 at Unknown time    ROS Physical Exam   Blood pressure 120/78, pulse 84, temperature 98.1 F (36.7 C), temperature source Oral, resp. rate 20, last menstrual period 07/03/2014, SpO2 100 %, not currently breastfeeding.  Physical Exam  Nursing note and vitals  reviewed. Constitutional: She is oriented to person, place, and time. She appears well-developed and well-nourished. No distress.  Cardiovascular: Normal rate.   Respiratory: Effort normal. No respiratory distress.  GI: Soft. There is no tenderness. There is no rebound.  Neurological: She is alert and oriented to person, place, and time.  Skin: Skin is warm and dry.  Psychiatric: She has a normal mood and affect.   FHT 160 with doppler  MAU Course  Procedures  Results for orders placed or performed during the hospital encounter of 09/23/14 (from the past 24 hour(s))  Urinalysis, Routine w reflex microscopic     Status: Abnormal   Collection Time: 09/23/14 10:48 PM  Result Value Ref Range   Color, Urine YELLOW YELLOW   APPearance CLEAR CLEAR   Specific Gravity, Urine 1.025 1.005 - 1.030   pH 6.0 5.0 - 8.0   Glucose, UA NEGATIVE NEGATIVE mg/dL   Hgb urine dipstick NEGATIVE NEGATIVE   Bilirubin Urine NEGATIVE NEGATIVE   Ketones, ur 40 (A) NEGATIVE mg/dL   Protein, ur NEGATIVE NEGATIVE mg/dL   Urobilinogen, UA 1.0 0.0 - 1.0 mg/dL   Nitrite NEGATIVE NEGATIVE   Leukocytes, UA NEGATIVE NEGATIVE   Patient has had phenergan PO, and is tolerating PO. She also had fioricet for her headache. Headache is better.   Assessment and Plan  , 1. Nausea and vomiting during  pregnancy prior to [redacted] weeks gestation      Medication List    TAKE these medications        acetaminophen 500 MG tablet  Commonly known as:  TYLENOL  Take 1,000 mg by mouth 2 (two) times daily as needed for mild pain or headache.     albuterol 108 (90 BASE) MCG/ACT inhaler  Commonly known as:  PROVENTIL HFA;VENTOLIN HFA  Inhale 1-2 puffs into the lungs every 6 (six) hours as needed for wheezing or shortness of breath.     promethazine 25 MG tablet  Commonly known as:  PHENERGAN  Take 0.5-1 tablets (12.5-25 mg total) by mouth once.       Follow-up Information    [facility]    Specialty:  Obstetrics and Gynecology   Why:  As scheduled   Contact information:   [address] [place] [id number] [phone]       Tawnya CrookHogan, Marvion Bastidas Donovan 09/23/2014, 11:08 PM

## 2014-09-25 ENCOUNTER — Telehealth: Payer: Self-pay | Admitting: Obstetrics & Gynecology

## 2014-09-25 ENCOUNTER — Encounter: Payer: Self-pay | Admitting: Obstetrics & Gynecology

## 2014-09-25 ENCOUNTER — Encounter: Payer: Medicaid Other | Admitting: Obstetrics & Gynecology

## 2014-09-25 NOTE — Telephone Encounter (Signed)
Called left message for patient to call clinics. Mailing certified letter to patient due to missed new ob appt.

## 2014-10-07 ENCOUNTER — Encounter: Payer: Self-pay | Admitting: General Practice

## 2014-11-06 ENCOUNTER — Encounter (HOSPITAL_COMMUNITY): Payer: Self-pay | Admitting: *Deleted

## 2014-11-06 ENCOUNTER — Inpatient Hospital Stay (HOSPITAL_COMMUNITY)
Admission: AD | Admit: 2014-11-06 | Discharge: 2014-11-06 | Disposition: A | Discharge status: Home / Self Care | Payer: Self-pay | Source: Ambulatory Visit | Attending: Obstetrics & Gynecology | Admitting: Obstetrics & Gynecology

## 2014-11-06 DIAGNOSIS — O210 Mild hyperemesis gravidarum: Secondary | ICD-10-CM | POA: Insufficient documentation

## 2014-11-06 DIAGNOSIS — Z3A18 18 weeks gestation of pregnancy: Secondary | ICD-10-CM | POA: Insufficient documentation

## 2014-11-06 DIAGNOSIS — F17210 Nicotine dependence, cigarettes, uncomplicated: Secondary | ICD-10-CM | POA: Insufficient documentation

## 2014-11-06 DIAGNOSIS — A084 Viral intestinal infection, unspecified: Secondary | ICD-10-CM | POA: Insufficient documentation

## 2014-11-06 DIAGNOSIS — O99332 Smoking (tobacco) complicating pregnancy, second trimester: Secondary | ICD-10-CM | POA: Insufficient documentation

## 2014-11-06 DIAGNOSIS — O9989 Other specified diseases and conditions complicating pregnancy, childbirth and the puerperium: Secondary | ICD-10-CM | POA: Insufficient documentation

## 2014-11-06 LAB — COMPREHENSIVE METABOLIC PANEL
ALBUMIN: 3.2 g/dL — AB (ref 3.5–5.2)
ALT: 12 U/L (ref 0–35)
AST: 17 U/L (ref 0–37)
Alkaline Phosphatase: 83 U/L (ref 39–117)
Anion gap: 11 (ref 5–15)
BUN: 10 mg/dL (ref 6–23)
CO2: 20 mmol/L (ref 19–32)
Calcium: 8.5 mg/dL (ref 8.4–10.5)
Chloride: 106 mmol/L (ref 96–112)
Creatinine, Ser: 0.45 mg/dL — ABNORMAL LOW (ref 0.50–1.10)
GFR calc Af Amer: >90 mL/min (ref >90)
GFR calc non Af Amer: >90 mL/min (ref >90)
Glucose, Bld: 71 mg/dL (ref 70–99)
POTASSIUM: 3.9 mmol/L (ref 3.5–5.1)
SODIUM: 137 mmol/L (ref 135–145)
TOTAL PROTEIN: 6.7 g/dL (ref 6.0–8.3)
Total Bilirubin: 0.3 mg/dL (ref 0.3–1.2)

## 2014-11-06 LAB — CBC WITH DIFFERENTIAL/PLATELET
BASOS PCT: 0 % (ref 0–1)
Basophils Absolute: 0 10*3/uL (ref 0.0–0.1)
Eosinophils Absolute: 0 10*3/uL (ref 0.0–0.7)
Eosinophils Relative: 0 % (ref 0–5)
HEMATOCRIT: 34.6 % — AB (ref 36.0–46.0)
HEMOGLOBIN: 11.2 g/dL — AB (ref 12.0–15.0)
LYMPHS ABS: 0.7 10*3/uL (ref 0.7–4.0)
Lymphocytes Relative: 10 % — ABNORMAL LOW (ref 12–46)
MCH: 24.5 pg — ABNORMAL LOW (ref 26.0–34.0)
MCHC: 32.4 g/dL (ref 30.0–36.0)
MCV: 75.5 fL — AB (ref 78.0–100.0)
Monocytes Absolute: 0.3 10*3/uL (ref 0.1–1.0)
Monocytes Relative: 4 % (ref 3–12)
NEUTROS ABS: 5.9 10*3/uL (ref 1.7–7.7)
NEUTROS PCT: 86 % — AB (ref 43–77)
Platelets: 206 10*3/uL (ref 150–400)
RBC: 4.58 MIL/uL (ref 3.87–5.11)
RDW: 15.6 % — ABNORMAL HIGH (ref 11.5–15.5)
WBC: 7 10*3/uL (ref 4.0–10.5)

## 2014-11-06 LAB — WET PREP, GENITAL
Clue Cells Wet Prep HPF POC: NONE SEEN
TRICH WET PREP: NONE SEEN
YEAST WET PREP: NONE SEEN

## 2014-11-06 LAB — URINALYSIS, ROUTINE W REFLEX MICROSCOPIC
Bilirubin Urine: NEGATIVE
GLUCOSE, UA: 250 mg/dL — AB
HGB URINE DIPSTICK: NEGATIVE
Ketones, ur: >80 mg/dL — AB
Leukocytes, UA: NEGATIVE
Nitrite: NEGATIVE
Protein, ur: NEGATIVE mg/dL
Specific Gravity, Urine: 1.025 (ref 1.005–1.030)
Urobilinogen, UA: 0.2 mg/dL (ref 0.0–1.0)
pH: 6 (ref 5.0–8.0)

## 2014-11-06 LAB — OB RESULTS CONSOLE HIV ANTIBODY (ROUTINE TESTING): HIV: NONREACTIVE

## 2014-11-06 MED ORDER — PROMETHAZINE HCL 25 MG PO TABS: 12.5000 mg | ORAL_TABLET | Freq: Once | ORAL | Status: DC

## 2014-11-06 MED ORDER — ONDANSETRON HCL 4 MG/2ML IJ SOLN
4.0000 mg | Freq: Once | INTRAMUSCULAR | Status: AC
  Administered 2014-11-06: 4 mg via INTRAVENOUS
  Filled 2014-11-06: qty 2

## 2014-11-06 MED ORDER — LACTATED RINGERS IV SOLN
INTRAVENOUS | Status: DC
  Administered 2014-11-06: 16:00:00 via INTRAVENOUS

## 2014-11-06 MED ORDER — PROMETHAZINE HCL 25 MG/ML IJ SOLN
25.0000 mg | Freq: Once | INTRAMUSCULAR | Status: AC
  Administered 2014-11-06: 25 mg via INTRAVENOUS
  Filled 2014-11-06: qty 1

## 2014-11-06 MED ORDER — DEXTROSE IN LACTATED RINGERS 5 % IV SOLN
INTRAVENOUS | Status: DC
  Administered 2014-11-06: 17:00:00 via INTRAVENOUS

## 2014-11-06 MED ORDER — ONDANSETRON HCL 4 MG PO TABS: 4.0000 mg | ORAL_TABLET | Freq: Four times a day (QID) | ORAL | Status: DC

## 2014-11-06 NOTE — MAU Note (Signed)
Pt states she starting vomiting this afternoon.  Vomited 2-3 times and called EMS after she vomited some blood.  Pt states she having a lot of abd cramping pain.  Diarrhea started at the same time and has 3 watery stools.  Pt states she hasn't felt well for 2 weeks but the vomiting and diarrhea began this afternoon.  Pt stated she has had vaginal bleeding x 1 week.  Not wearing a pad but states she just changes her panties.

## 2014-11-06 NOTE — Discharge Instructions (Signed)

## 2014-11-06 NOTE — MAU Provider Note (Signed)
History     CSN: [id number]  Arrival date and time: 11/06/14 1450   None     Chief Complaint  Patient presents with  . Emesis  . Diarrhea  . Vaginal Bleeding   HPI This is a 19 y.o. female at [redacted]w[redacted]d who presents with  nausea, vomiting and diarrhea. Has had spotting and lower abdominal cramping for 1-2 weeks.  Has had abdominal pain and nausea for 2 weeks. Has not been seen for it. Missed new OB appointment.   RN Note:  Expand All Collapse All   Pt states she starting vomiting this afternoon. Vomited 2-3 times and called EMS after she vomited some blood. Pt states she having a lot of abd cramping pain. Diarrhea started at the same time and has 3 watery stools. Pt states she hasn't felt well for 2 weeks but the vomiting and diarrhea began this afternoon. Pt stated she has had vaginal bleeding x 1 week. Not wearing a pad but states she just changes her panties.            OB History    Gravida Para Term Preterm AB TAB SAB Ectopic Multiple Living   0 0 0 0 0 0 4      Past Medical History  Diagnosis Date  . Asthma   . ADHD (attention deficit hyperactivity disorder)   . Bipolar 1 disorder   . PTSD (post-traumatic stress disorder)     Past Surgical History  Procedure Laterality Date  . Abdominal hernia repair    . Hernia repair      Family History  Problem Relation Age of Onset  . Diabetes Mother   . Hypertension Mother   . Diabetes Maternal Grandmother   . Hypertension Maternal Grandmother   . Heart disease Maternal Grandmother     History  Substance Use Topics  . Smoking status: Current Some Day Smoker -- 0.25 packs/day    Types: Cigarettes  . Smokeless tobacco: Not on file  . Alcohol Use: No    Allergies: No Known Allergies  Prescriptions prior to admission  Medication Sig Dispense Refill Last Dose  . acetaminophen (TYLENOL) 500 MG tablet Take 1,000 mg by mouth 2 (two) times daily as needed for mild pain or headache.   11/05/2014 at  Unknown time  . albuterol (PROVENTIL HFA;VENTOLIN HFA) 108 (90 BASE) MCG/ACT inhaler Inhale 1-2 puffs into the lungs every 6 (six) hours as needed for wheezing or shortness of breath. 1 Inhaler 3 11/05/2014 at Unknown time  . [DISCONTINUED] promethazine (PHENERGAN) 25 MG tablet Take 0.5-1 tablets (12.5-25 mg total) by mouth once. (Patient not taking: Reported on 11/06/2014) 30 tablet 3     Review of Systems  Constitutional: Positive for malaise/fatigue. Negative for fever and chills.  Gastrointestinal: Positive for nausea, vomiting, abdominal pain and diarrhea. Negative for constipation.  Genitourinary: Negative for dysuria.   Physical Exam   Blood pressure 119/47, pulse 108, temperature 98.9 F (37.2 C), temperature source Oral, resp. rate 20, last menstrual period 07/03/2014, SpO2 100 %, not currently breastfeeding.  Physical Exam  Constitutional: She is oriented to person, place, and time. She appears well-developed. No distress.  HENT:  Head: Normocephalic.  Cardiovascular: Normal rate.   Respiratory: Effort normal.  GI: Soft. She exhibits no distension and no mass. There is tenderness. There is guarding. There is no rebound.  Genitourinary: Vagina normal. No vaginal discharge (Cultures done, Cervix long and closed) found.  Musculoskeletal: Normal range of motion.  Neurological: She  is alert and oriented to person, place, and time.  Skin: Skin is warm and dry.  Psychiatric: She has a normal mood and affect.    MAU Course  Procedures  MDM Given IV hydration and Phenergan, which made her very sleepy. Does not seem to be in pain any more, though she says she still has some cramps.  Results for orders placed or performed during the hospital encounter of 11/06/14 (from the past 24 hour(s))  Wet prep, genital     Status: Abnormal   Collection Time: 11/06/14  3:30 PM  Result Value Ref Range   Yeast Wet Prep HPF POC NONE SEEN NONE SEEN   Trich, Wet Prep NONE SEEN NONE SEEN   Clue  Cells Wet Prep HPF POC NONE SEEN NONE SEEN   WBC, Wet Prep HPF POC MANY (A) NONE SEEN  CBC with Differential/Platelet     Status: Abnormal   Collection Time: 11/06/14  3:38 PM  Result Value Ref Range   WBC 7.0 4.0 - 10.5 K/uL   RBC 4.58 3.87 - 5.11 MIL/uL   Hemoglobin 11.2 (L) 12.0 - 15.0 g/dL   HCT 16.1 (L) 09.6 - 04.5 %   MCV 75.5 (L) 78.0 - 100.0 fL   MCH 24.5 (L) 26.0 - 34.0 pg   MCHC 32.4 30.0 - 36.0 g/dL   RDW 40.9 (H) 81.1 - 91.4 %   Platelets 206 150 - 400 K/uL   Neutrophils Relative % 86 (H) 43 - 77 %   Neutro Abs 5.9 1.7 - 7.7 K/uL   Lymphocytes Relative 10 (L) 12 - 46 %   Lymphs Abs 0.7 0.7 - 4.0 K/uL   Monocytes Relative 4 3 - 12 %   Monocytes Absolute 0.3 0.1 - 1.0 K/uL   Eosinophils Relative 0 0 - 5 %   Eosinophils Absolute 0.0 0.0 - 0.7 K/uL   Basophils Relative 0 0 - 1 %   Basophils Absolute 0.0 0.0 - 0.1 K/uL  Comprehensive metabolic panel     Status: Abnormal   Collection Time: 11/06/14  3:38 PM  Result Value Ref Range   Sodium 137 135 - 145 mmol/L   Potassium 3.9 3.5 - 5.1 mmol/L   Chloride 106 96 - 112 mmol/L   CO2 20 19 - 32 mmol/L   Glucose, Bld 71 70 - 99 mg/dL   BUN 10 6 - 23 mg/dL   Creatinine, Ser 7.82 (L) 0.50 - 1.10 mg/dL   Calcium 8.5 8.4 - 95.6 mg/dL   Total Protein 6.7 6.0 - 8.3 g/dL   Albumin 3.2 (L) 3.5 - 5.2 g/dL   AST 17 0 - 37 U/L   ALT 12 0 - 35 U/L   Alkaline Phosphatase 83 39 - 117 U/L   Total Bilirubin 0.3 0.3 - 1.2 mg/dL   GFR calc non Af Amer >90 >90 mL/min   GFR calc Af Amer >90 >90 mL/min   Anion gap 11 5 - 15  Urinalysis, Routine w reflex microscopic     Status: Abnormal   Collection Time: 11/06/14  8:05 PM  Result Value Ref Range   Color, Urine YELLOW YELLOW   APPearance CLEAR CLEAR   Specific Gravity, Urine 1.025 1.005 - 1.030   pH 6.0 5.0 - 8.0   Glucose, UA 250 (A) NEGATIVE mg/dL   Hgb urine dipstick NEGATIVE NEGATIVE   Bilirubin Urine NEGATIVE NEGATIVE   Ketones, ur >80 (A) NEGATIVE mg/dL   Protein, ur NEGATIVE  NEGATIVE mg/dL   Urobilinogen,  UA 0.2 0.0 - 1.0 mg/dL   Nitrite NEGATIVE NEGATIVE   Leukocytes, UA NEGATIVE NEGATIVE   Tolerating sips of ginger ale. Still has not voided  Assessment and Plan  Report to oncoming PA.  Need UA, may need to cath  [facility] CenterWILLIAMS,MARIE 11/06/2014, 3:14 PM   MDM UA collected. > 80 ketones, but no evidence of UTI.  RN states patient has had more episodes of emesis. 4 mg Zofran IV ordered.  Patient reports improvement after Zofran. Able to tolerate PO in MAU  A: SIUP at 6531w0d Viral gastroenteritis  P: Discharge home Rx for Phenergan and Zofran given to patient Patient advised to increased PO hydration and advance diet as tolerated Patient to follow-up with [facility] to start prenatal care as scheduled Patient may return to MAU as needed or if her condition were to change or worsen   Marny LowensteinJulie N Aaniya Sterba, PA-C  11/06/2014 9:04 PM

## 2014-11-07 LAB — GC/CHLAMYDIA PROBE AMP (~~LOC~~) NOT AT ARMC
CHLAMYDIA, DNA PROBE: NEGATIVE
NEISSERIA GONORRHEA: NEGATIVE

## 2014-11-07 LAB — HIV ANTIBODY (ROUTINE TESTING W REFLEX): HIV Screen 4th Generation wRfx: NONREACTIVE

## 2014-11-17 ENCOUNTER — Encounter: Payer: Self-pay | Admitting: Obstetrics and Gynecology

## 2014-11-17 ENCOUNTER — Encounter: Payer: Medicaid Other | Admitting: Obstetrics and Gynecology

## 2014-11-18 ENCOUNTER — Encounter: Payer: Self-pay | Admitting: General Practice

## 2014-12-18 ENCOUNTER — Encounter: Payer: Medicaid Other | Admitting: Family Medicine

## 2014-12-18 ENCOUNTER — Telehealth: Payer: Self-pay | Admitting: Family Medicine

## 2014-12-18 NOTE — Telephone Encounter (Signed)
Called patient about missed appointment today. Went straight to voice mail, but wasn't able to leave a message.

## 2014-12-29 ENCOUNTER — Inpatient Hospital Stay (HOSPITAL_COMMUNITY)
Admission: AD | Admit: 2014-12-29 | Discharge: 2014-12-29 | Disposition: A | Discharge status: Home / Self Care | Payer: Medicaid Other | Source: Ambulatory Visit | Attending: Obstetrics and Gynecology | Admitting: Obstetrics and Gynecology

## 2014-12-29 ENCOUNTER — Encounter (HOSPITAL_COMMUNITY): Payer: Self-pay | Admitting: *Deleted

## 2014-12-29 DIAGNOSIS — O26893 Other specified pregnancy related conditions, third trimester: Secondary | ICD-10-CM

## 2014-12-29 DIAGNOSIS — O9989 Other specified diseases and conditions complicating pregnancy, childbirth and the puerperium: Secondary | ICD-10-CM | POA: Insufficient documentation

## 2014-12-29 DIAGNOSIS — Z3A25 25 weeks gestation of pregnancy: Secondary | ICD-10-CM | POA: Insufficient documentation

## 2014-12-29 DIAGNOSIS — R197 Diarrhea, unspecified: Secondary | ICD-10-CM | POA: Insufficient documentation

## 2014-12-29 DIAGNOSIS — F17210 Nicotine dependence, cigarettes, uncomplicated: Secondary | ICD-10-CM | POA: Insufficient documentation

## 2014-12-29 DIAGNOSIS — M545 Low back pain, unspecified: Secondary | ICD-10-CM

## 2014-12-29 DIAGNOSIS — Z3483 Encounter for supervision of other normal pregnancy, third trimester: Secondary | ICD-10-CM

## 2014-12-29 DIAGNOSIS — O99332 Smoking (tobacco) complicating pregnancy, second trimester: Secondary | ICD-10-CM | POA: Insufficient documentation

## 2014-12-29 DIAGNOSIS — W19XXXA Unspecified fall, initial encounter: Secondary | ICD-10-CM | POA: Insufficient documentation

## 2014-12-29 LAB — URINALYSIS, ROUTINE W REFLEX MICROSCOPIC
Bilirubin Urine: NEGATIVE
GLUCOSE, UA: NEGATIVE mg/dL
HGB URINE DIPSTICK: NEGATIVE
KETONES UR: NEGATIVE mg/dL
Leukocytes, UA: NEGATIVE
Nitrite: NEGATIVE
Protein, ur: NEGATIVE mg/dL
Specific Gravity, Urine: 1.02 (ref 1.005–1.030)
UROBILINOGEN UA: 0.2 mg/dL (ref 0.0–1.0)
pH: 7 (ref 5.0–8.0)

## 2014-12-29 LAB — TYPE AND SCREEN
ABO/RH(D): A NEG
Antibody Screen: NEGATIVE

## 2014-12-29 MED ORDER — CYCLOBENZAPRINE HCL 10 MG PO TABS
10.0000 mg | ORAL_TABLET | Freq: Once | ORAL | Status: AC
  Administered 2014-12-29: 10 mg via ORAL
  Filled 2014-12-29: qty 1

## 2014-12-29 MED ORDER — RHO D IMMUNE GLOBULIN 1500 UNIT/2ML IJ SOSY
300.0000 ug | PREFILLED_SYRINGE | Freq: Once | INTRAMUSCULAR | Status: AC
Start: 2014-12-29 — End: 2014-12-29
  Administered 2014-12-29: 300 ug via INTRAMUSCULAR
  Filled 2014-12-29: qty 2

## 2014-12-29 MED ORDER — CYCLOBENZAPRINE HCL 5 MG PO TABS: 5.0000 mg | ORAL_TABLET | Freq: Three times a day (TID) | ORAL | Status: DC | PRN

## 2014-12-29 NOTE — MAU Provider Note (Signed)
History     CSN: [id number]  Arrival date and time: 12/29/14 1047   First Provider Initiated Contact with Patient 12/29/14 1119      Chief Complaint  Patient presents with  . Decreased Fetal Movement  . Back Pain   HPI  Patient is 19 y.o. [id number] here with complaints of diarrhea and a h/o of fall w/ kick about 1 week ago at work.    1. Diarrhea Patient reports that this has been ongoing for about 2 weeks.  Stooling about 2 times daily.  She denies fevers, vomiting, or sick contacts.  No undercooked foods.  No dizziness, LOC, no hematochezia, no melena. Patient is tolerating PO well.    2. Fall w/ kick Happened about 1 week ago.  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  Patient endorses decreased fetal movement since 2-3 days ago.   Denies LOF, contractions, VB, vaginal discharge.  3. Low back pain Endorses low back pain that has been ongoing for the last several weeks.  She describes pain as sharp localized to her lower thoracic and lumbar region.  She reports that tylenol does not work for back pain.  No aggravating or relieving factors.  +FM, denies LOF, VB, contractions, vaginal discharge.  OB History    Gravida Para Term Preterm AB TAB SAB Ectopic Multiple Living   5 4 4  0 0 0 0 0 0 4      Past Medical History  Diagnosis Date  . Asthma   . ADHD (attention deficit hyperactivity disorder)   . Bipolar 1 disorder   . PTSD (post-traumatic stress disorder)     Past Surgical History  Procedure Laterality Date  . Abdominal hernia repair    . Hernia repair      Family History  Problem Relation Age of Onset  . Diabetes Mother   . Hypertension Mother   . Diabetes Maternal Grandmother   . Hypertension Maternal Grandmother   . Heart disease Maternal Grandmother     History  Substance Use Topics  . Smoking status: Current Some Day Smoker -- 0.25 packs/day    Types: Cigarettes   . Smokeless tobacco: Not on file  . Alcohol Use: No    Allergies: No Known Allergies  Prescriptions prior to admission  Medication Sig Dispense Refill Last Dose  . acetaminophen (TYLENOL) 500 MG tablet Take 1,000 mg by mouth 2 (two) times daily as needed for mild pain or headache.   11/05/2014 at Unknown time  . albuterol (PROVENTIL HFA;VENTOLIN HFA) 108 (90 BASE) MCG/ACT inhaler Inhale 1-2 puffs into the lungs every 6 (six) hours as needed for wheezing or shortness of breath. 1 Inhaler 3 11/05/2014 at Unknown time  . ondansetron (ZOFRAN) 4 MG tablet Take 1 tablet (4 mg total) by mouth every 6 (six) hours. 12 tablet 0   . promethazine (PHENERGAN) 25 MG tablet Take 0.5-1 tablets (12.5-25 mg total) by mouth once. 30 tablet 3     ROS Physical Exam   Blood pressure 122/57, pulse 87, temperature 97.9 F (36.6 C), temperature source Oral, resp. rate 18, last menstrual period 07/03/2014, not currently breastfeeding.  Physical Exam  Constitutional: She is oriented to person, place, and time. She appears well-developed and well-nourished. No distress.  HENT:  Head: Normocephalic and atraumatic.  Eyes: EOM are normal. No scleral icterus.  Neck: Normal range of motion. Neck supple.  Cardiovascular: Normal rate, regular  rhythm, normal heart sounds and intact distal pulses.   No murmur heard. Respiratory: Effort normal and breath sounds normal. No respiratory distress. She has no wheezes.  GI: Bowel sounds are normal. There is no tenderness. There is no rebound and no guarding.  Musculoskeletal: Normal range of motion. She exhibits no edema or tenderness.  Normal AROM, no bony TTP along spine, no paraspinal TTP, strength 5/5 UE and LE, +increased lordosis of Lumbar spine  Neurological: She is alert and oriented to person, place, and time.  Skin: Skin is warm and dry. No rash noted.  Psychiatric: She has a normal mood and affect. Her behavior is normal.   Fetal monitoringBaseline: 145 bpm and  Variability: Good {> 6 bpm) Uterine activityNone   MAU Course  Procedures  MDM Patient is A-.  NST reactive.  No contractions on monitor.  No LOF, BV.  Flexeril PO x1 for LBP.  Assessment and Plan  Patient 1 week s/p fall at work.  FHR reassuring.  Recommend Rh immunoglobulin as scheduled.  Patient should follow up with OB as scheduled.  Delynn Flavin M, DO 12/29/2014, 11:27 AM   OB fellow attestation:  I have seen and examined this patient; I agree with above documentation in the resident's note.   FRANCEEN ERISMAN is a 19 y.o. [id number] here 1 week after abdominal trauma +FM, denies LOF, VB, contractions, vaginal discharge.  PE: BP 122/57 mmHg  Pulse 87  Temp(Src) 97.9 F (36.6 C) (Oral)  Resp 18  LMP 07/03/2014 Gen: calm comfortable, NAD Resp: normal effort, no distress Abd: gravid  ROS, labs, PMH reviewed NST reactive  Plan: - fetal kick counts reinforced, preterm labor precautions, discussed trauma precautions - hx of fall with kick: reinforced need to follow up promptly after any trauma, risks of abruption.  Consents for safety at home, reports trauma occurred at work - continue routine follow up in OB clinic - Rh neg => type and screen, rhogam prior to discharge  Perry Mount, MD 12:32 PM
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
## 2014-12-29 NOTE — MAU Note (Signed)
Per Dr. Nadine CountsGottschalk ok to take pt. Off monitor at this time.

## 2014-12-29 NOTE — Discharge Instructions (Signed)

## 2014-12-30 LAB — RH IG WORKUP (INCLUDES ABO/RH)
ABO/RH(D): A NEG
Fetal Screen: NEGATIVE
Gestational Age(Wks): 25
Unit division: 0

## 2015-01-01 ENCOUNTER — Ambulatory Visit (HOSPITAL_COMMUNITY)
Admission: RE | Admit: 2015-01-01 | Discharge: 2015-01-01 | Disposition: A | Discharge status: Home / Self Care | Payer: Medicaid Other | Source: Ambulatory Visit | Attending: Obstetrics and Gynecology | Admitting: Obstetrics and Gynecology

## 2015-01-01 DIAGNOSIS — W19XXXA Unspecified fall, initial encounter: Secondary | ICD-10-CM

## 2015-01-01 DIAGNOSIS — O0932 Supervision of pregnancy with insufficient antenatal care, second trimester: Secondary | ICD-10-CM | POA: Insufficient documentation

## 2015-01-01 DIAGNOSIS — Z3A26 26 weeks gestation of pregnancy: Secondary | ICD-10-CM | POA: Insufficient documentation

## 2015-01-01 DIAGNOSIS — Z3483 Encounter for supervision of other normal pregnancy, third trimester: Secondary | ICD-10-CM

## 2015-01-01 DIAGNOSIS — Z3689 Encounter for other specified antenatal screening: Secondary | ICD-10-CM | POA: Insufficient documentation

## 2015-01-13 ENCOUNTER — Telehealth: Payer: Self-pay

## 2015-01-13 DIAGNOSIS — Z3483 Encounter for supervision of other normal pregnancy, third trimester: Secondary | ICD-10-CM

## 2015-01-13 NOTE — Telephone Encounter (Signed)
Patient called requesting ultrasound results. Called patient and informed her of results-- need for follow-up 4 weeks from 01/01/15. Patient requests morning appointment for 01/29/15-- informed her I would schedule and call her back. U/S scheduled for 01/29/15 at 1015. Called patient and informed her of appointment date, time and location. Also informed her of NOB appointment here in clinic 01/20/15. Patient aware and verbalized understanding to all. No further questions or concerns.

## 2015-01-20 ENCOUNTER — Encounter: Payer: Medicaid Other | Admitting: Obstetrics and Gynecology

## 2015-01-28 ENCOUNTER — Encounter: Payer: Self-pay | Admitting: Advanced Practice Midwife

## 2015-01-28 ENCOUNTER — Encounter: Payer: Medicaid Other | Admitting: Advanced Practice Midwife

## 2015-01-29 ENCOUNTER — Ambulatory Visit (HOSPITAL_COMMUNITY): Payer: Medicaid Other

## 2015-01-30 ENCOUNTER — Ambulatory Visit (HOSPITAL_COMMUNITY)
Admission: RE | Admit: 2015-01-30 | Discharge: 2015-01-30 | Disposition: A | Discharge status: Home / Self Care | Payer: Medicaid Other | Source: Ambulatory Visit | Attending: Obstetrics and Gynecology | Admitting: Obstetrics and Gynecology

## 2015-01-30 ENCOUNTER — Encounter: Payer: Self-pay | Admitting: Obstetrics & Gynecology

## 2015-01-30 DIAGNOSIS — Z3483 Encounter for supervision of other normal pregnancy, third trimester: Secondary | ICD-10-CM

## 2015-01-30 DIAGNOSIS — Z3A30 30 weeks gestation of pregnancy: Secondary | ICD-10-CM | POA: Insufficient documentation

## 2015-01-30 DIAGNOSIS — O0933 Supervision of pregnancy with insufficient antenatal care, third trimester: Secondary | ICD-10-CM | POA: Insufficient documentation

## 2015-01-30 DIAGNOSIS — Z36 Encounter for antenatal screening of mother: Secondary | ICD-10-CM | POA: Insufficient documentation

## 2015-02-05 ENCOUNTER — Encounter: Payer: Medicaid Other | Admitting: Advanced Practice Midwife

## 2015-02-28 ENCOUNTER — Inpatient Hospital Stay (HOSPITAL_COMMUNITY)
Admission: AD | Admit: 2015-02-28 | Discharge: 2015-02-28 | Disposition: A | Discharge status: Home / Self Care | Payer: Medicaid Other | Source: Ambulatory Visit | Attending: Obstetrics and Gynecology | Admitting: Obstetrics and Gynecology

## 2015-02-28 ENCOUNTER — Encounter (HOSPITAL_COMMUNITY): Payer: Self-pay | Admitting: *Deleted

## 2015-02-28 DIAGNOSIS — R102 Pelvic and perineal pain: Secondary | ICD-10-CM | POA: Diagnosis present

## 2015-02-28 DIAGNOSIS — O99333 Smoking (tobacco) complicating pregnancy, third trimester: Secondary | ICD-10-CM | POA: Diagnosis not present

## 2015-02-28 DIAGNOSIS — O9989 Other specified diseases and conditions complicating pregnancy, childbirth and the puerperium: Secondary | ICD-10-CM | POA: Insufficient documentation

## 2015-02-28 DIAGNOSIS — F17210 Nicotine dependence, cigarettes, uncomplicated: Secondary | ICD-10-CM | POA: Diagnosis not present

## 2015-02-28 DIAGNOSIS — N949 Unspecified condition associated with female genital organs and menstrual cycle: Secondary | ICD-10-CM | POA: Diagnosis not present

## 2015-02-28 DIAGNOSIS — Z3A34 34 weeks gestation of pregnancy: Secondary | ICD-10-CM | POA: Insufficient documentation

## 2015-02-28 DIAGNOSIS — O26893 Other specified pregnancy related conditions, third trimester: Secondary | ICD-10-CM

## 2015-02-28 LAB — URINALYSIS, ROUTINE W REFLEX MICROSCOPIC
BILIRUBIN URINE: NEGATIVE
Glucose, UA: NEGATIVE mg/dL
HGB URINE DIPSTICK: NEGATIVE
Ketones, ur: NEGATIVE mg/dL
Leukocytes, UA: NEGATIVE
Nitrite: NEGATIVE
Protein, ur: NEGATIVE mg/dL
Specific Gravity, Urine: 1.02 (ref 1.005–1.030)
Urobilinogen, UA: 0.2 mg/dL (ref 0.0–1.0)
pH: 6 (ref 5.0–8.0)

## 2015-02-28 NOTE — MAU Provider Note (Signed)
  History     CSN: [id number]  Arrival date and time: 02/28/15 0537   None     No chief complaint on file.  HPI  Patient is 19 y.o. [id number] [redacted]w[redacted]d here with complaints of pelvic pressure.  Arrived via EMS.  +FM, denies LOF, VB, contractions, vaginal discharge.   Past Medical History  Diagnosis Date  . Asthma   . ADHD (attention deficit hyperactivity disorder)   . Bipolar 1 disorder   . PTSD (post-traumatic stress disorder)     Past Surgical History  Procedure Laterality Date  . Abdominal hernia repair    . Hernia repair      Family History  Problem Relation Age of Onset  . Diabetes Mother   . Hypertension Mother   . Diabetes Maternal Grandmother   . Hypertension Maternal Grandmother   . Heart disease Maternal Grandmother     History  Substance Use Topics  . Smoking status: Light Tobacco Smoker -- 0.00 packs/day    Types: Cigarettes  . Smokeless tobacco: Not on file  . Alcohol Use: No    Allergies: No Known Allergies  Prescriptions prior to admission  Medication Sig Dispense Refill Last Dose  . acetaminophen (TYLENOL) 500 MG tablet Take 1,000 mg by mouth 2 (two) times daily as needed for mild pain or headache.   12/15/2014  . albuterol (PROVENTIL HFA;VENTOLIN HFA) 108 (90 BASE) MCG/ACT inhaler Inhale 1-2 puffs into the lungs every 6 (six) hours as needed for wheezing or shortness of breath. (Patient not taking: Reported on 12/29/2014) 1 Inhaler 3 More than a month at Unknown time  . cyclobenzaprine (FLEXERIL) 5 MG tablet Take 1 tablet (5 mg total) by mouth 3 (three) times daily as needed for muscle spasms. 30 tablet 0   . promethazine (PHENERGAN) 25 MG tablet Take 0.5-1 tablets (12.5-25 mg total) by mouth once. (Patient not taking: Reported on 12/29/2014) 30 tablet 3 More than a month at Unknown time    Review of Systems  Constitutional: Negative for fever and chills.  HENT: Negative for congestion.   Respiratory: Negative for cough and shortness of breath.    Cardiovascular: Negative for chest pain and leg swelling.  Gastrointestinal: Positive for diarrhea. Negative for heartburn, nausea and vomiting.  Genitourinary: Negative for dysuria, urgency, frequency and hematuria.  Skin: Negative for itching and rash.  Neurological: Positive for headaches. Negative for dizziness and loss of consciousness.   Physical Exam   Blood pressure 113/75, pulse 93, temperature 98.2 F (36.8 C), temperature source Oral, resp. rate 18, last menstrual period 07/03/2014, not currently breastfeeding.  Physical Exam  Constitutional: She is oriented to person, place, and time. She appears well-developed and well-nourished.  HENT:  Head: Normocephalic and atraumatic.  Eyes: Conjunctivae and EOM are normal.  Neck: Normal range of motion.  Cardiovascular: Normal rate.   Respiratory: Effort normal. No respiratory distress.  GI: Soft. Bowel sounds are normal. She exhibits no distension. There is no tenderness.  Musculoskeletal: Normal range of motion. She exhibits no edema.  Neurological: She is alert and oriented to person, place, and time.  Skin: Skin is warm and dry. No erythema.   Dilation: 1.5 Effacement (%): 40 Station: -2 Exam by:: Loreta Ave  MAU Course  Procedures  MDM NST reactive, no contractions  Assessment and Plan  Patient is 18 y.o. [id number] [redacted]w[redacted]d reporting pelvic pressure likely secondary to normal discomforts of pregnancy - fetal kick counts reinforced - preterm labor precautions   Joel Cowin ROCIO 02/28/2015, 6:30 AM

## 2015-03-12 ENCOUNTER — Inpatient Hospital Stay (HOSPITAL_COMMUNITY)
Admission: AD | Admit: 2015-03-12 | Discharge: 2015-03-12 | Disposition: A | Discharge status: Home / Self Care | Payer: Medicaid Other | Source: Ambulatory Visit | Attending: Obstetrics & Gynecology | Admitting: Obstetrics & Gynecology

## 2015-03-12 ENCOUNTER — Encounter (HOSPITAL_COMMUNITY): Payer: Self-pay | Admitting: *Deleted

## 2015-03-12 ENCOUNTER — Inpatient Hospital Stay (HOSPITAL_COMMUNITY): Payer: Medicaid Other

## 2015-03-12 DIAGNOSIS — F17210 Nicotine dependence, cigarettes, uncomplicated: Secondary | ICD-10-CM | POA: Diagnosis not present

## 2015-03-12 DIAGNOSIS — Z3A36 36 weeks gestation of pregnancy: Secondary | ICD-10-CM | POA: Diagnosis not present

## 2015-03-12 DIAGNOSIS — O99333 Smoking (tobacco) complicating pregnancy, third trimester: Secondary | ICD-10-CM | POA: Insufficient documentation

## 2015-03-12 DIAGNOSIS — O4703 False labor before 37 completed weeks of gestation, third trimester: Secondary | ICD-10-CM | POA: Diagnosis not present

## 2015-03-12 DIAGNOSIS — O0933 Supervision of pregnancy with insufficient antenatal care, third trimester: Secondary | ICD-10-CM | POA: Insufficient documentation

## 2015-03-12 DIAGNOSIS — W19XXXA Unspecified fall, initial encounter: Secondary | ICD-10-CM | POA: Diagnosis not present

## 2015-03-12 DIAGNOSIS — Y92239 Unspecified place in hospital as the place of occurrence of the external cause: Secondary | ICD-10-CM

## 2015-03-12 LAB — DIFFERENTIAL
Basophils Absolute: 0 10*3/uL (ref 0.0–0.1)
Basophils Relative: 0 % (ref 0–1)
EOS ABS: 0 10*3/uL (ref 0.0–0.7)
EOS PCT: 1 % (ref 0–5)
Lymphocytes Relative: 28 % (ref 12–46)
Lymphs Abs: 1.8 10*3/uL (ref 0.7–4.0)
MONO ABS: 0.4 10*3/uL (ref 0.1–1.0)
MONOS PCT: 6 % (ref 3–12)
Neutro Abs: 4.3 10*3/uL (ref 1.7–7.7)
Neutrophils Relative %: 65 % (ref 43–77)

## 2015-03-12 LAB — OB RESULTS CONSOLE RPR: RPR: NONREACTIVE

## 2015-03-12 LAB — CBC
HCT: 28.5 % — ABNORMAL LOW (ref 36.0–46.0)
Hemoglobin: 9.2 g/dL — ABNORMAL LOW (ref 12.0–15.0)
MCH: 24 pg — ABNORMAL LOW (ref 26.0–34.0)
MCHC: 32.3 g/dL (ref 30.0–36.0)
MCV: 74.2 fL — AB (ref 78.0–100.0)
Platelets: 190 10*3/uL (ref 150–400)
RBC: 3.84 MIL/uL — AB (ref 3.87–5.11)
RDW: 15.2 % (ref 11.5–15.5)
WBC: 6.5 10*3/uL (ref 4.0–10.5)

## 2015-03-12 LAB — RAPID URINE DRUG SCREEN, HOSP PERFORMED
AMPHETAMINES: NOT DETECTED
Barbiturates: NOT DETECTED
Benzodiazepines: NOT DETECTED
Cocaine: NOT DETECTED
Opiates: NOT DETECTED
Tetrahydrocannabinol: NOT DETECTED

## 2015-03-12 LAB — URINALYSIS, ROUTINE W REFLEX MICROSCOPIC
BILIRUBIN URINE: NEGATIVE
GLUCOSE, UA: NEGATIVE mg/dL
Hgb urine dipstick: NEGATIVE
Ketones, ur: >80 mg/dL — AB
LEUKOCYTES UA: NEGATIVE
Nitrite: NEGATIVE
Protein, ur: NEGATIVE mg/dL
SPECIFIC GRAVITY, URINE: 1.025 (ref 1.005–1.030)
Urobilinogen, UA: 0.2 mg/dL (ref 0.0–1.0)
pH: 6 (ref 5.0–8.0)

## 2015-03-12 LAB — GROUP B STREP BY PCR: Group B strep by PCR: POSITIVE — AB

## 2015-03-12 LAB — OB RESULTS CONSOLE GC/CHLAMYDIA: GC PROBE AMP, GENITAL: NEGATIVE

## 2015-03-12 LAB — HEPATITIS B SURFACE ANTIGEN: Hepatitis B Surface Ag: NEGATIVE

## 2015-03-12 LAB — OB RESULTS CONSOLE GBS: GBS: POSITIVE

## 2015-03-12 MED ORDER — LACTATED RINGERS IV BOLUS (SEPSIS)
1000.0000 mL | Freq: Once | INTRAVENOUS | Status: AC
  Administered 2015-03-12: 1000 mL via INTRAVENOUS

## 2015-03-12 MED ORDER — BUTORPHANOL TARTRATE 1 MG/ML IJ SOLN
1.0000 mg | Freq: Once | INTRAMUSCULAR | Status: AC
  Administered 2015-03-12: 1 mg via INTRAVENOUS
  Filled 2015-03-12: qty 1

## 2015-03-12 NOTE — MAU Provider Note (Signed)
History   CSN: [id number]  Arrival date and time: 03/12/15 1230   First Provider Initiated Contact with Patient 03/12/15 1541     Chief Complaint  Patient presents with  . Labor Eval   HPI  Patient is 19 y.o. [id number] [redacted]w[redacted]d here with complaints of painful contractions. She presented to MAU by ambulance. Initially was a labor check but received call by nurse to room because patient had fallen. Prior to fall patient received a dose of stadol for pain. Patient states she got up to go to bathroom and fell on left side. She says she got dizzy and lightheaded and fell. She was able to get herself back in the bed and called nurse. Went to examine patient and she was lying in bed with bed rails up. She is tearful and complaining of pain. She states the medications given did not help with pain (RN did witness patient sound asleep after med admin). Patient repeatedly keeps saying she wants this baby to be delivered and why won't we deliver the baby as that is the only thing that will help her pain.  +FM, +contractions, denies LOF, VB, contractions, vaginal discharge.   OB History    Gravida Para Term Preterm AB TAB SAB Ectopic Multiple Living   0 0 0 0 0 0 4    -No prenatal care despite multiple initial OB appointments scheduled at [facility].   Past Medical History  Diagnosis Date  . Asthma   . ADHD (attention deficit hyperactivity disorder)   . Bipolar 1 disorder   . PTSD (post-traumatic stress disorder)     Past Surgical History  Procedure Laterality Date  . Abdominal hernia repair    . Hernia repair      Family History  Problem Relation Age of Onset  . Diabetes Mother   . Hypertension Mother   . Diabetes Maternal Grandmother   . Hypertension Maternal Grandmother   . Heart disease Maternal Grandmother     History  Substance Use Topics  . Smoking status: Light Tobacco Smoker -- 0.00 packs/day    Types: Cigarettes  . Smokeless tobacco: Not on file  . Alcohol Use: No     Allergies: No Known Allergies  Prescriptions prior to admission  Medication Sig Dispense Refill Last Dose  . acetaminophen (TYLENOL) 500 MG tablet Take 1,000 mg by mouth 2 (two) times daily as needed for mild pain or headache.   Past Week at Unknown time  . albuterol (PROVENTIL HFA;VENTOLIN HFA) 108 (90 BASE) MCG/ACT inhaler Inhale 1-2 puffs into the lungs every 6 (six) hours as needed for wheezing or shortness of breath. (Patient not taking: Reported on 12/29/2014) 1 Inhaler 3 More than a month at Unknown time    Review of Systems  Constitutional: Negative for fever and chills.  Eyes: Negative for blurred vision and double vision.  Respiratory: Negative for shortness of breath.   Cardiovascular: Negative for chest pain and leg swelling.  Gastrointestinal: Positive for abdominal pain. Negative for nausea and vomiting.  Genitourinary: Negative for dysuria.  Musculoskeletal: Positive for back pain.  Neurological: Positive for dizziness. Negative for headaches.  Also per HPI  Physical Exam   Blood pressure 133/74, pulse 134, temperature 98.2 F (36.8 C), temperature source Oral, resp. rate 18, last menstrual period 07/03/2014, SpO2 100 %, not currently breastfeeding.  Physical Exam  Constitutional: She is oriented to person, place, and time. She appears well-developed and well-nourished. She appears distressed.  HENT:  Head: Normocephalic and atraumatic.  Eyes: EOM are normal.  Cardiovascular: Regular rhythm, normal heart sounds and intact distal pulses.  Tachycardia present.   Respiratory: Effort normal and breath sounds normal.  GI: Soft. Bowel sounds are normal. There is no tenderness.  Genitourinary: Vagina normal.  Musculoskeletal: Normal range of motion. She exhibits no edema or tenderness.  Neurological: She is alert and oriented to person, place, and time.  Skin: Skin is warm and dry.  Psychiatric: Her mood appears anxious. Her affect is labile and inappropriate.    Dilation: 1.5 Effacement (%): 20 Cervical Position: Posterior Station: -3 Presentation: Vertex Exam by:: Ivonne Andrew CNM   Results for orders placed or performed during the hospital encounter of 03/12/15 (from the past 24 hour(s))  OB RESULT CONSOLE Group B Strep     Status: None   Collection Time: 03/12/15 12:00 AM  Result Value Ref Range   GBS Positive   CBC     Status: Abnormal   Collection Time: 03/12/15  3:15 PM  Result Value Ref Range   WBC 6.5 4.0 - 10.5 K/uL   RBC 3.84 (L) 3.87 - 5.11 MIL/uL   Hemoglobin 9.2 (L) 12.0 - 15.0 g/dL   HCT 16.1 (L) 09.6 - 04.5 %   MCV 74.2 (L) 78.0 - 100.0 fL   MCH 24.0 (L) 26.0 - 34.0 pg   MCHC 32.3 30.0 - 36.0 g/dL   RDW 40.9 81.1 - 91.4 %   Platelets 190 150 - 400 K/uL  Differential     Status: None   Collection Time: 03/12/15  3:15 PM  Result Value Ref Range   Neutrophils Relative % 65 43 - 77 %   Neutro Abs 4.3 1.7 - 7.7 K/uL   Lymphocytes Relative 28 12 - 46 %   Lymphs Abs 1.8 0.7 - 4.0 K/uL   Monocytes Relative 6 3 - 12 %   Monocytes Absolute 0.4 0.1 - 1.0 K/uL   Eosinophils Relative 1 0 - 5 %   Eosinophils Absolute 0.0 0.0 - 0.7 K/uL   Basophils Relative 0 0 - 1 %   Basophils Absolute 0.0 0.0 - 0.1 K/uL  Group B strep by PCR     Status: Abnormal   Collection Time: 03/12/15  3:38 PM  Result Value Ref Range   Group B strep by PCR POSITIVE (A) NEGATIVE    MAU Course  Procedures - None  MDM: -s/p fluid bolus -toco with irregular contraction pattern -NST reactive and reassuring (was flat about 1 hr after stadol but picked back up) -prental labs ordered along with GBS and Gc/Ch  -OB US limited ordered - normal results -s/p Stadol  -UA and UDS pending  Assessment and Plan  A: Patient is 19 y.o. [id number] [redacted]w[redacted]d reporting painful contractions. S/p fall without injury and FHT reassuring. No concern at this time for PTL as patient's cervix unchanged.   P: Discharge home - stable - Reviewed findings and my conclusion -  fetal kick counts reinforced - labor precautions dicussed - Handout given - Scheduled for initial OB appointment   Caryl Ada, DO 03/12/2015, 4:28 PM PGY-1, [facility]  I was present for the exam and agree with above. Examined pt after reported fall. Pt states she stood up at the side of the bed after stadol, felt dizzy and fell on her left hip. Denies hitting abd, head or anything else. Denies VB or LOF. However, pt was inconsistent in her reports of fall details and, per RN, was found w/ side rails  up, call-bell draped over side rail the same as when RN left the room 10 minutes prior. I Question if fall actually occurred.   Pt reports tenderness in hip. Abd NT. No bruising, abrasions or swelling. Able to ambulate normally w/ RN.  UC decreased significantly since arrival to MAU. Pt in NAD, not acknowledging UC's while CNM in room. No cervical change. Asking repeatedly to be delivered. Discussed risks of late preterm delivery. Also asked pt why she has not received prenatal care. She states her work schedule interfered, but then stated she was taken out of work one month ago for swelling in her legs. Strongly encouraged pt to comply w/ prenatal care and sent in-basket message to [facility] to schedule NOB.   Abruption, labor precautions reviewed.  Pt observed until stadol wore off and D/C'd home in stable condition per consult w/ Dr. Shawnie Pons.   [place], [place] 03/12/2015 7:13 PM

## 2015-03-12 NOTE — MAU Note (Signed)
Contractions every 1-2 minutes lasting 20 seconds each for the past 45 minutes  Denies  bright red vaginal bleeding.  Decreased fetal movement since 6am  Denies SROM/LOF  Denies complications of pregnancy  GBS unknown per patient

## 2015-03-12 NOTE — MAU Note (Signed)
Pt asks to walk to bathroom and to u/s when she goes. Pt instructed by Doroteo Glassman, DO that she is not to walk until after her u/s and determined that she and her baby are doing fine. Pt verbalizes understanding. Patient is asking under what conditions we would proceed to take her to labor and delivery. I explained to patient that her cervix would have to be changing or her water broke at this gestational age.

## 2015-03-12 NOTE — MAU Note (Signed)
Pt denies any pain at this time other than when she has contraction pain in her lower abdomen, which she rates a 7. When I asked her why she got up without calling for help earlier she says that she was confused and thought she was at home. I reviewed fall precautions for next visit with her and she verbalized understanding of the [facility] Admissions teaching sheet. Copy of falls free MAU teaching sheet given to patient.

## 2015-03-12 NOTE — MAU Note (Addendum)
Pt on stretcher on left side crying. Says that she "fell". She says that she was getting up to go to the bathroom and had not called anyone. When asked why she is crying she points to her left side. Pt is in exactly the same position as I left her when I checked on her at 1402. Fetal heart monitor is still on her abdomen. Call button is draped over head of bed within pt reach.

## 2015-03-12 NOTE — Discharge Instructions (Signed)
Braxton Hicks Contractions °Contractions of the uterus can occur throughout pregnancy. Contractions are not always a sign that you are in labor.  °WHAT ARE BRAXTON HICKS CONTRACTIONS?  °Contractions that occur before labor are called Braxton Hicks contractions, or false labor. Toward the end of pregnancy (32-34 weeks), these contractions can develop more often and may become more forceful. This is not true labor because these contractions do not result in opening (dilatation) and thinning of the cervix. They are sometimes difficult to tell apart from true labor because these contractions can be forceful and people have different pain tolerances. You should not feel embarrassed if you go to the hospital with false labor. Sometimes, the only way to tell if you are in true labor is for your health care provider to look for changes in the cervix. °If there are no prenatal problems or other health problems associated with the pregnancy, it is completely safe to be sent home with false labor and await the onset of true labor. °HOW CAN YOU TELL THE DIFFERENCE BETWEEN TRUE AND FALSE LABOR? °False Labor °· The contractions of false labor are usually shorter and not as hard as those of true labor.   °· The contractions are usually irregular.   °· The contractions are often felt in the front of the lower abdomen and in the groin.   °· The contractions may go away when you walk around or change positions while lying down.   °· The contractions get weaker and are shorter lasting as time goes on.   °· The contractions do not usually become progressively stronger, regular, and closer together as with true labor.   °True Labor °· Contractions in true labor last 30-70 seconds, become very regular, usually become more intense, and increase in frequency.   °· The contractions do not go away with walking.   °· The discomfort is usually felt in the top of the uterus and spreads to the lower abdomen and low back.   °· True labor can be  determined by your health care provider with an exam. This will show that the cervix is dilating and getting thinner.   °WHAT TO REMEMBER °· Keep up with your usual exercises and follow other instructions given by your health care provider.   °· Take medicines as directed by your health care provider.   °· Keep your regular prenatal appointments.   °· Eat and drink lightly if you think you are going into labor.   °· If Braxton Hicks contractions are making you uncomfortable:   °¨ Change your position from lying down or resting to walking, or from walking to resting.   °¨ Sit and rest in a tub of warm water.   °¨ Drink 2-3 glasses of water. Dehydration may cause these contractions.   °¨ Do slow and deep breathing several times an hour.   °WHEN SHOULD I SEEK IMMEDIATE MEDICAL CARE? °Seek immediate medical care if: °· Your contractions become stronger, more regular, and closer together.   °· You have fluid leaking or gushing from your vagina.   °· You have a fever.   °· You pass blood-tinged mucus.   °· You have vaginal bleeding.   °· You have continuous abdominal pain.   °· You have low back pain that you never had before.   °· You feel your baby's head pushing down and causing pelvic pressure.   °· Your baby is not moving as much as it used to.   °Document Released: 09/05/2005 Document Revised: 09/10/2013 Document Reviewed: 06/17/2013 °ExitCare® Patient Information ©2015 [employer]. This information is not intended to replace advice given to you by your health care   provider. Make sure you discuss any questions you have with your health care provider.  What Do I Need to Know About Injuries During Pregnancy? Injuries can happen during pregnancy. Minor falls and accidents usually do not harm you or your baby. However, any injury should be reported to your doctor. WHAT CAN I DO TO PROTECT MYSELF FROM INJURIES?  Remove rugs and loose objects on the floor.  Wear comfortable shoes that have a good grip. Do not  wear high-heeled shoes.  Always wear your seat belt. The lap belt should be below your belly. Always practice safe driving.  Do not ride on a motorcycle.  Do not participate in high-impact activities or sports.  Avoid:  Walking on wet or slippery floors.  Fires.  Starting fires.  Lifting heavy pots of boiling or hot liquids.  Fixing electrical problems.  Only take medicine as told by your doctor.  Know your blood type and the blood type of the baby's father.  Call your local emergency services (911 in the [place] if you are a victim of domestic violence or assault. For help and support, contact the [employer]. WHEN SHOULD I GET HELP RIGHT AWAY?  You fall on your belly or have any high-impact accident or injury.  You have been a victim of domestic violence or any kind of violence.  You have been in a car accident.  You have bleeding from your vagina.  Fluid is leaking from your vagina.  You start to have belly cramping (contractions) or pain.  You feel weak or pass out (faint).  You start to throw up (vomit) after an injury.  You have been burned.  You have a stiff neck or neck pain.  You get a headache or have vision problems after an injury.  You do not feel the baby move or the baby is not moving as much as normal. Document Released: 10/08/2010 Document Revised: 01/20/2014 Document Reviewed: 06/12/2013 [facility] Patient Information 2015 [place], [place]. This information is not intended to replace advice given to you by your health care provider. Make sure you discuss any questions you have with your health care provider.

## 2015-03-13 LAB — RPR: RPR: NONREACTIVE

## 2015-03-13 LAB — GC/CHLAMYDIA PROBE AMP (~~LOC~~) NOT AT ARMC
Chlamydia: NEGATIVE
NEISSERIA GONORRHEA: NEGATIVE

## 2015-03-13 LAB — RUBELLA SCREEN: RUBELLA: 1.98 {index} (ref >0.99)

## 2015-03-17 ENCOUNTER — Encounter: Payer: Self-pay | Admitting: General Practice

## 2015-03-20 ENCOUNTER — Encounter (HOSPITAL_COMMUNITY): Payer: Self-pay | Admitting: *Deleted

## 2015-03-20 ENCOUNTER — Inpatient Hospital Stay (HOSPITAL_COMMUNITY)
Admission: AD | Admit: 2015-03-20 | Discharge: 2015-03-20 | Disposition: A | Discharge status: Home / Self Care | Payer: Medicaid Other | Source: Ambulatory Visit | Attending: Family Medicine | Admitting: Family Medicine

## 2015-03-20 DIAGNOSIS — Z3A37 37 weeks gestation of pregnancy: Secondary | ICD-10-CM | POA: Diagnosis not present

## 2015-03-20 DIAGNOSIS — O0933 Supervision of pregnancy with insufficient antenatal care, third trimester: Secondary | ICD-10-CM

## 2015-03-20 DIAGNOSIS — Z3493 Encounter for supervision of normal pregnancy, unspecified, third trimester: Secondary | ICD-10-CM | POA: Diagnosis not present

## 2015-03-20 LAB — URINALYSIS, ROUTINE W REFLEX MICROSCOPIC
Bilirubin Urine: NEGATIVE
Glucose, UA: NEGATIVE mg/dL
Hgb urine dipstick: NEGATIVE
Ketones, ur: 15 mg/dL — AB
Leukocytes, UA: NEGATIVE
Nitrite: NEGATIVE
PROTEIN: NEGATIVE mg/dL
Specific Gravity, Urine: >1.03 — ABNORMAL HIGH (ref 1.005–1.030)
Urobilinogen, UA: 0.2 mg/dL (ref 0.0–1.0)
pH: 6 (ref 5.0–8.0)

## 2015-03-20 LAB — POCT PREGNANCY, URINE: PREG TEST UR: POSITIVE — AB

## 2015-03-20 NOTE — Discharge Instructions (Signed)
Followup appointment scheduled for 7/6.   If bleeding becomes heavy, return to MAU. Fetal Movement Counts Patient Name: __________________________________________________ Patient Due Date: ____________________ Performing a fetal movement count is highly recommended in high-risk pregnancies, but it is good for every pregnant woman to do. Your health care provider may ask you to start counting fetal movements at 28 weeks of the pregnancy. Fetal movements often increase:  After eating a full meal.  After physical activity.  After eating or drinking something sweet or cold.  At rest. Pay attention to when you feel the baby is most active. This will help you notice a pattern of your baby's sleep and wake cycles and what factors contribute to an increase in fetal movement. It is important to perform a fetal movement count at the same time each day when your baby is normally most active.  HOW TO COUNT FETAL MOVEMENTS 1. Find a quiet and comfortable area to sit or lie down on your left side. Lying on your left side provides the best blood and oxygen circulation to your baby. 2. Write down the day and time on a sheet of paper or in a journal. 3. Start counting kicks, flutters, swishes, rolls, or jabs in a 2-hour period. You should feel at least 10 movements within 2 hours. 4. If you do not feel 10 movements in 2 hours, wait 2-3 hours and count again. Look for a change in the pattern or not enough counts in 2 hours. SEEK MEDICAL CARE IF:  You feel less than 10 counts in 2 hours, tried twice.  There is no movement in over an hour.  The pattern is changing or taking longer each day to reach 10 counts in 2 hours.  You feel the baby is not moving as he or she usually does. Date: ____________ Movements: ____________ Start time: ____________ Doreatha Martin time: ____________  Date: ____________ Movements: ____________ Start time: ____________ Doreatha Martin time: ____________ Date: ____________ Movements: ____________  Start time: ____________ Doreatha Martin time: ____________ Date: ____________ Movements: ____________ Start time: ____________ Doreatha Martin time: ____________ Date: ____________ Movements: ____________ Start time: ____________ Doreatha Martin time: ____________ Date: ____________ Movements: ____________ Start time: ____________ Doreatha Martin time: ____________ Date: ____________ Movements: ____________ Start time: ____________ Doreatha Martin time: ____________ Date: ____________ Movements: ____________ Start time: ____________ Doreatha Martin time: ____________  Date: ____________ Movements: ____________ Start time: ____________ Doreatha Martin time: ____________ Date: ____________ Movements: ____________ Start time: ____________ Doreatha Martin time: ____________ Date: ____________ Movements: ____________ Start time: ____________ Doreatha Martin time: ____________ Date: ____________ Movements: ____________ Start time: ____________ Doreatha Martin time: ____________ Date: ____________ Movements: ____________ Start time: ____________ Doreatha Martin time: ____________ Date: ____________ Movements: ____________ Start time: ____________ Doreatha Martin time: ____________ Date: ____________ Movements: ____________ Start time: ____________ Doreatha Martin time: ____________  Date: ____________ Movements: ____________ Start time: ____________ Doreatha Martin time: ____________ Date: ____________ Movements: ____________ Start time: ____________ Doreatha Martin time: ____________ Date: ____________ Movements: ____________ Start time: ____________ Doreatha Martin time: ____________ Date: ____________ Movements: ____________ Start time: ____________ Doreatha Martin time: ____________ Date: ____________ Movements: ____________ Start time: ____________ Doreatha Martin time: ____________ Date: ____________ Movements: ____________ Start time: ____________ Doreatha Martin time: ____________ Date: ____________ Movements: ____________ Start time: ____________ Doreatha Martin time: ____________  Date: ____________ Movements: ____________ Start time: ____________ Doreatha Martin time:  ____________ Date: ____________ Movements: ____________ Start time: ____________ Doreatha Martin time: ____________ Date: ____________ Movements: ____________ Start time: ____________ Doreatha Martin time: ____________ Date: ____________ Movements: ____________ Start time: ____________ Doreatha Martin time: ____________ Date: ____________ Movements: ____________ Start time: ____________ Doreatha Martin time: ____________ Date: ____________ Movements: ____________ Start time: ____________ Doreatha Martin time: ____________ Date: ____________ Movements: ____________ Start  time: ____________ Doreatha Martin time: ____________  Date: ____________ Movements: ____________ Start time: ____________ Doreatha Martin time: ____________ Date: ____________ Movements: ____________ Start time: ____________ Doreatha Martin time: ____________ Date: ____________ Movements: ____________ Start time: ____________ Doreatha Martin time: ____________ Date: ____________ Movements: ____________ Start time: ____________ Doreatha Martin time: ____________ Date: ____________ Movements: ____________ Start time: ____________ Doreatha Martin time: ____________ Date: ____________ Movements: ____________ Start time: ____________ Doreatha Martin time: ____________ Date: ____________ Movements: ____________ Start time: ____________ Doreatha Martin time: ____________  Date: ____________ Movements: ____________ Start time: ____________ Doreatha Martin time: ____________ Date: ____________ Movements: ____________ Start time: ____________ Doreatha Martin time: ____________ Date: ____________ Movements: ____________ Start time: ____________ Doreatha Martin time: ____________ Date: ____________ Movements: ____________ Start time: ____________ Doreatha Martin time: ____________ Date: ____________ Movements: ____________ Start time: ____________ Doreatha Martin time: ____________ Date: ____________ Movements: ____________ Start time: ____________ Doreatha Martin time: ____________ Date: ____________ Movements: ____________ Start time: ____________ Doreatha Martin time: ____________  Date: ____________ Movements:  ____________ Start time: ____________ Doreatha Martin time: ____________ Date: ____________ Movements: ____________ Start time: ____________ Doreatha Martin time: ____________ Date: ____________ Movements: ____________ Start time: ____________ Doreatha Martin time: ____________ Date: ____________ Movements: ____________ Start time: ____________ Doreatha Martin time: ____________ Date: ____________ Movements: ____________ Start time: ____________ Doreatha Martin time: ____________ Date: ____________ Movements: ____________ Start time: ____________ Doreatha Martin time: ____________ Date: ____________ Movements: ____________ Start time: ____________ Doreatha Martin time: ____________  Date: ____________ Movements: ____________ Start time: ____________ Doreatha Martin time: ____________ Date: ____________ Movements: ____________ Start time: ____________ Doreatha Martin time: ____________ Date: ____________ Movements: ____________ Start time: ____________ Doreatha Martin time: ____________ Date: ____________ Movements: ____________ Start time: ____________ Doreatha Martin time: ____________ Date: ____________ Movements: ____________ Start time: ____________ Doreatha Martin time: ____________ Date: ____________ Movements: ____________ Start time: ____________ Doreatha Martin time: ____________ Document Released: 10/05/2006 Document Revised: 01/20/2014 Document Reviewed: 07/02/2012 Exit[employer] Patient Information 2015 [place]. This information is not intended to replace advice given to you by your health care provider. Make sure you discuss any questions you have with your health care provider. Braxton Hicks Contractions Contractions of the uterus can occur throughout pregnancy. Contractions are not always a sign that you are in labor.  WHAT ARE BRAXTON HICKS CONTRACTIONS?  Contractions that occur before labor are called Braxton Hicks contractions, or false labor. Toward the end of pregnancy (32-34 weeks), these contractions can develop more often and may become more forceful. This is not true labor because these  contractions do not result in opening (dilatation) and thinning of the cervix. They are sometimes difficult to tell apart from true labor because these contractions can be forceful and people have different pain tolerances. You should not feel embarrassed if you go to the hospital with false labor. Sometimes, the only way to tell if you are in true labor is for your health care provider to look for changes in the cervix. If there are no prenatal problems or other health problems associated with the pregnancy, it is completely safe to be sent home with false labor and await the onset of true labor. HOW CAN YOU TELL THE DIFFERENCE BETWEEN TRUE AND FALSE LABOR? False Labor  The contractions of false labor are usually shorter and not as hard as those of true labor.   The contractions are usually irregular.   The contractions are often felt in the front of the lower abdomen and in the groin.   The contractions may go away when you walk around or change positions while lying down.   The contractions get weaker and are shorter lasting as time goes on.   The contractions do not usually become progressively stronger, regular, and closer together as with  true labor.  True Labor 5. Contractions in true labor last 30-70 seconds, become very regular, usually become more intense, and increase in frequency.  6. The contractions do not go away with walking.  7. The discomfort is usually felt in the top of the uterus and spreads to the lower abdomen and low back.  8. True labor can be determined by your health care provider with an exam. This will show that the cervix is dilating and getting thinner.  WHAT TO REMEMBER  Keep up with your usual exercises and follow other instructions given by your health care provider.   Take medicines as directed by your health care provider.   Keep your regular prenatal appointments.   Eat and drink lightly if you think you are going into labor.   If  Braxton Hicks contractions are making you uncomfortable:   Change your position from lying down or resting to walking, or from walking to resting.   Sit and rest in a tub of warm water.   Drink 2-3 glasses of water. Dehydration may cause these contractions.   Do slow and deep breathing several times an hour.  WHEN SHOULD I SEEK IMMEDIATE MEDICAL CARE? Seek immediate medical care if:  Your contractions become stronger, more regular, and closer together.   You have fluid leaking or gushing from your vagina.   You have a fever.   You pass blood-tinged mucus.   You have vaginal bleeding.   You have continuous abdominal pain.   You have low back pain that you never had before.   You feel your baby's head pushing down and causing pelvic pressure.   Your baby is not moving as much as it used to.  Document Released: 09/05/2005 Document Revised: 09/10/2013 Document Reviewed: 06/17/2013 [facility] Patient Information 2015 [place]. This information is not intended to replace advice given to you by your health care provider. Make sure you discuss any questions you have with your health care provider.

## 2015-03-20 NOTE — MAU Note (Signed)
Pt reports uc's. Pt reports bleeding yesterday,but not today.

## 2015-03-21 ENCOUNTER — Inpatient Hospital Stay (HOSPITAL_COMMUNITY)
Admission: AD | Admit: 2015-03-21 | Discharge: 2015-03-21 | Disposition: A | Discharge status: Home / Self Care | Payer: Medicaid Other | Source: Ambulatory Visit | Attending: Family Medicine | Admitting: Family Medicine

## 2015-03-21 DIAGNOSIS — Z3493 Encounter for supervision of normal pregnancy, unspecified, third trimester: Secondary | ICD-10-CM | POA: Diagnosis not present

## 2015-03-21 MED ORDER — OXYCODONE-ACETAMINOPHEN 5-325 MG PO TABS
2.0000 | ORAL_TABLET | Freq: Once | ORAL | Status: AC
Start: 2015-03-21 — End: 2015-03-21
  Administered 2015-03-21: 2 via ORAL
  Filled 2015-03-21: qty 2

## 2015-03-21 NOTE — MAU Note (Signed)
Contractions for 2 hours. Denies LOF or bleeding

## 2015-03-21 NOTE — Discharge Instructions (Signed)
Pain Relief During Labor and Delivery Everyone experiences pain differently, but labor causes severe pain for many women. The amount of pain you experience during labor and delivery depends on your pain tolerance, contraction strength, and your baby's size and position. There are many ways to prepare for and deal with the pain, including:   Taking prenatal classes to learn about labor and delivery. The more informed you are, the less anxious and afraid you may be. This can help lessen the pain.  Taking pain-relieving medicine during labor and delivery.  Learning breathing and relaxation techniques.  Taking a shower or bath.  Getting massaged.  Changing positions.  Placing an ice pack on your back. Discuss your pain control options with your health care provider during your prenatal visits.  WHAT ARE THE TWO TYPES OF PAIN-RELIEVING MEDICINES? 1. Analgesics. These are medicines that decrease pain without total loss of feeling or muscle movement. 2. Anesthetics. These are medicines that block all feeling, including pain. There can be minor side effects of both types, such as nausea, trouble concentrating, becoming sleepy, and lowering the heart rate of the baby. However, health care providers are careful to give doses that will not seriously affect the baby.  WHAT ARE THE SPECIFIC TYPES OF ANALGESICS AND ANESTHETICS? Systemic Analgesic Systemic pain medicines affect your whole body rather than focusing pain relief on the area of your body experiencing pain. This type of medicine is given either through an IV tube in your vein or by a shot (injection) into your muscle. This medicine will lessen your pain but will not stop it completely. It may also make you sleepy, but it will not make you lose consciousness.  Local Anesthetic Local anesthetic isused tonumb a small area of your body. The medicine is injected into the area of nerves that carry feeling to the vagina, vulva, or the area between  the vagina and anus (perineum).  General Anesthetic This type of medicine causes you to lose consciousness so you do not feel pain. It is usually used only in emergency situations during labor. It is given through an IV tube or face mask. Paracervical Block A paracervical block is a form of local anesthesia given during labor. Numbing medicine is injected into the right and left sides of the cervix and vagina. It helps to lessen the pain caused by contractions and stretching of the cervix. It may have to be given more than once.  Pudendal Block A pudendal block is another form of local anesthesia. It is used to relieve the pain associated with pushing or stretching of the perineum at the time of delivery. An injection is given deep through the vaginal wall into the pudendal nerve in the pelvis, numbing the perineum.  Epidural Anesthetic An epidural is an injection of numbing medicine given in the lower back and into the epidural space near your spinal cord. The epidural numbs the lower half of your body. You may be able to move your legs but will not be allowed to walk. Epidurals can be used for labor, delivery, or cesarean deliveries.  To prevent the medicine from wearing off, a small tube (catheter) may be threaded into the epidural space and taped in place to prevent it from slipping out. Medicine can then be given continuously in small doses through the tube until you deliver. Spinal Block A spinal block is similar to an epidural, but the medicine is injected into the spinal fluid, not the epidural space. A spinal block is only given  once. It starts to relieve pain quickly but lasts only 1-2 hours. Spinal blocks can also be used for cesarean deliveries.  Combined Spinal-Epidural Block Combined spinal-epidural blocks combine the benefits of both the spinal and epidural blocks. The spinal part acts quickly to relieve pain and the epidural provides continuous pain relief. Hydrotherapy Immersion in  warm water during labor may provide comfort and relaxation. It may also help to lessen pain, the use of anesthesia, and the length of labor. However, immersion in water during the delivery (water birth) may have some risk involved and studies to determine safety and risks are ongoing. If you are a healthy woman who is expecting an uncomplicated birth, talk with your health care provider to see if water birth is an option for you.  Document Released: 12/22/2008 Document Revised: 09/10/2013 Document Reviewed: 01/24/2013 [facility] Patient Information 2015 [place], [place]. This information is not intended to replace advice given to you by your health care provider. Make sure you discuss any questions you have with your health care provider. Braxton Hicks Contractions Contractions of the uterus can occur throughout pregnancy. Contractions are not always a sign that you are in labor.  WHAT ARE BRAXTON HICKS CONTRACTIONS?  Contractions that occur before labor are called Braxton Hicks contractions, or false labor. Toward the end of pregnancy (32-34 weeks), these contractions can develop more often and may become more forceful. This is not true labor because these contractions do not result in opening (dilatation) and thinning of the cervix. They are sometimes difficult to tell apart from true labor because these contractions can be forceful and people have different pain tolerances. You should not feel embarrassed if you go to the hospital with false labor. Sometimes, the only way to tell if you are in true labor is for your health care provider to look for changes in the cervix. If there are no prenatal problems or other health problems associated with the pregnancy, it is completely safe to be sent home with false labor and await the onset of true labor. HOW CAN YOU TELL THE DIFFERENCE BETWEEN TRUE AND FALSE LABOR? False Labor  The contractions of false labor are usually shorter and not as hard as those of true  labor.   The contractions are usually irregular.   The contractions are often felt in the front of the lower abdomen and in the groin.   The contractions may go away when you walk around or change positions while lying down.   The contractions get weaker and are shorter lasting as time goes on.   The contractions do not usually become progressively stronger, regular, and closer together as with true labor.  True Labor 3. Contractions in true labor last 30-70 seconds, become very regular, usually become more intense, and increase in frequency.  4. The contractions do not go away with walking.  5. The discomfort is usually felt in the top of the uterus and spreads to the lower abdomen and low back.  6. True labor can be determined by your health care provider with an exam. This will show that the cervix is dilating and getting thinner.  WHAT TO REMEMBER  Keep up with your usual exercises and follow other instructions given by your health care provider.   Take medicines as directed by your health care provider.   Keep your regular prenatal appointments.   Eat and drink lightly if you think you are going into labor.   If Braxton Hicks contractions are making you uncomfortable:  Change your position from lying down or resting to walking, or from walking to resting.   Sit and rest in a tub of warm water.   Drink 2-3 glasses of water. Dehydration may cause these contractions.   Do slow and deep breathing several times an hour.  WHEN SHOULD I SEEK IMMEDIATE MEDICAL CARE? Seek immediate medical care if:  Your contractions become stronger, more regular, and closer together.   You have fluid leaking or gushing from your vagina.   You have a fever.   You pass blood-tinged mucus.   You have vaginal bleeding.   You have continuous abdominal pain.   You have low back pain that you never had before.   You feel your baby's head pushing down and causing  pelvic pressure.   Your baby is not moving as much as it used to.  Document Released: 09/05/2005 Document Revised: 09/10/2013 Document Reviewed: 06/17/2013 [facility] Patient Information 2015 [place]. This information is not intended to replace advice given to you by your health care provider. Make sure you discuss any questions you have with your health care provider.

## 2015-03-25 ENCOUNTER — Encounter: Payer: Medicaid Other | Admitting: Advanced Practice Midwife

## 2015-04-01 ENCOUNTER — Ambulatory Visit (INDEPENDENT_AMBULATORY_CARE_PROVIDER_SITE_OTHER): Payer: Medicaid Other | Admitting: Certified Nurse Midwife

## 2015-04-01 VITALS — BP 135/75 | HR 112 | Temp 98.4°F | Wt 189.4 lb

## 2015-04-01 DIAGNOSIS — Z3483 Encounter for supervision of other normal pregnancy, third trimester: Secondary | ICD-10-CM

## 2015-04-01 DIAGNOSIS — Z3490 Encounter for supervision of normal pregnancy, unspecified, unspecified trimester: Secondary | ICD-10-CM | POA: Insufficient documentation

## 2015-04-01 DIAGNOSIS — O0933 Supervision of pregnancy with insufficient antenatal care, third trimester: Secondary | ICD-10-CM

## 2015-04-01 DIAGNOSIS — Z23 Encounter for immunization: Secondary | ICD-10-CM | POA: Diagnosis not present

## 2015-04-01 LAB — POCT URINALYSIS DIP (DEVICE)
Bilirubin Urine: NEGATIVE
GLUCOSE, UA: NEGATIVE mg/dL
Hgb urine dipstick: NEGATIVE
KETONES UR: NEGATIVE mg/dL
Leukocytes, UA: NEGATIVE
Nitrite: NEGATIVE
Protein, ur: NEGATIVE mg/dL
Specific Gravity, Urine: 1.02 (ref 1.005–1.030)
Urobilinogen, UA: 0.2 mg/dL (ref 0.0–1.0)
pH: 6.5 (ref 5.0–8.0)

## 2015-04-01 LAB — GLUCOSE, CAPILLARY: Glucose-Capillary: 97 mg/dL (ref 65–99)

## 2015-04-01 MED ORDER — TETANUS-DIPHTH-ACELL PERTUSSIS 5-2.5-18.5 LF-MCG/0.5 IM SUSP
0.5000 mL | Freq: Once | INTRAMUSCULAR | Status: AC
  Administered 2015-04-01: 0.5 mL via INTRAMUSCULAR

## 2015-04-01 MED ORDER — ALBUTEROL SULFATE HFA 108 (90 BASE) MCG/ACT IN AERS: 2.0000 | INHALATION_SPRAY | Freq: Four times a day (QID) | RESPIRATORY_TRACT | Status: DC | PRN

## 2015-04-01 MED ORDER — PRENATAL VITAMINS 0.8 MG PO TABS: 1.0000 | ORAL_TABLET | Freq: Every day | ORAL | Status: DC

## 2015-04-01 NOTE — Progress Notes (Signed)
See NEW OB Note  Subjective:    Berenis M Carroll iLavinia Sharpss a [id number] being seen today for her first obstetrical visit.  Her obstetrical history is significant for inadequate prenatal care. New OB visit today.. Patient does not intend to breast feed. Pregnancy history fully reviewed. Wants nexplanon for birth control  Patient reports occassional contractions and some diarhea that started last night.  Filed Vitals:   04/01/15 0850  BP: 135/75  Pulse: 112  Temp: 98.4 F (36.9 C)  Weight: 189 lb 6.4 oz (85.911 kg)    HISTORY: OB History  Gravida Para Term Preterm AB SAB TAB Ectopic Multiple Living  5 4 4  0 0 0 0 0 0 4    # Outcome Date GA Lbr Len/2nd Weight Sex Delivery Anes PTL Lv  5 Current           4 Term 05/21/14 6335w0d 13:58 / 00:07 7 lb 13 oz (3.544 kg) M Vag-Spont EPI  Y  3 Term 03/12/13 935w0d  6 lb 9 oz (2.977 kg) F Vag-Spont EPI  Y     Comments: baby came out blue, "was stuck for a while, low oxygen to brain"   2 Term 03/08/11 3011w0d  5 lb 4 oz (2.381 kg) F Vag-Spont EPI  Y  1 Term 04/07/10 345w0d  6 lb 3 oz (2.807 kg) F Vag-Spont EPI  Y     Past Medical History  Diagnosis Date  . Asthma   . ADHD (attention deficit hyperactivity disorder)   . Bipolar 1 disorder   . PTSD (post-traumatic stress disorder)    Past Surgical History  Procedure Laterality Date  . Abdominal hernia repair    . Hernia repair     Family History  Problem Relation Age of Onset  . Diabetes Mother   . Hypertension Mother   . Diabetes Maternal Grandmother   . Hypertension Maternal Grandmother   . Heart disease Maternal Grandmother      Exam    Uterus:     Pelvic Exam:    Perineum: No Hemorrhoids   Vulva: normal   Vagina:  normal discharge   pH:    Cervix: 1/thick ballotable   Adnexa:    Bony Pelvis: gynecoid  System: Breast:     Skin: normal coloration and turgor, no rashes    Neurologic: oriented, normal   Extremities: normal strength, tone, and muscle mass   HEENT    Mouth/Teeth mucous membranes moist, pharynx normal without lesions   Neck supple and no masses   Cardiovascular: regular rate and rhythm   Respiratory:  appears well, vitals normal, no respiratory distress, acyanotic, normal RR, ear and throat exam is normal, neck free of mass or lymphadenopathy, chest clear, no wheezing, crepitations, rhonchi, normal symmetric air entry   Abdomen: soft, non-tender; bowel sounds normal; no masses,  no organomegaly   Urinary: urethral meatus normal      Assessment:    Pregnancy: [id number] Patient Active Problem List   Diagnosis Date Noted  . Supervision of normal subsequent pregnancy 04/01/2015  . Fall during current hospitalization   . No prenatal care in current pregnancy in third trimester   . Encounter for fetal anatomic survey   . Late prenatal care affecting pregnancy in second trimester, antepartum   . Rh negative state in antepartum period 01/31/2014  . Mild intermittent asthma 01/30/2014  . Tobacco abuse 01/30/2014        Plan:     Initial labs drawn in [place] Prenatal vitamins.  Problem list reviewed and updated. Genetic Screening discussed too late to do testing.  Ultrasound discussed; fetal survey: results reviewed.  Follow up in 1 weeks. 50% of 30 min visit spent on counseling and coordination of care.  TDAP Albuterol Inhaler RX Prenatal Vitamins RX   Clemmons,Lori Grissett 04/01/2015

## 2015-04-01 NOTE — Patient Instructions (Signed)

## 2015-04-01 NOTE — Progress Notes (Signed)
Breastfeeding tip of the week reviewed Pt states she is having difficulty catching her breath, states she needs a refill for inhaler Breastfeeding tip of the week reviewed Tdap today Rhogam given 12/29/14 @ 25 wks 5 days Home Medicaid form completed FSBS 97

## 2015-04-01 NOTE — Addendum Note (Signed)
Addended by: Candelaria StagersHAIZLIP, Shanan Mcmiller E on: 04/01/2015 11:05 AM   Modules accepted: Orders

## 2015-04-02 LAB — URINE CULTURE
Colony Count: NO GROWTH
Organism ID, Bacteria: NO GROWTH

## 2015-04-03 LAB — PRESCRIPTION MONITORING PROFILE (19 PANEL)
Amphetamine/Meth: NEGATIVE ng/mL
Barbiturate Screen, Urine: NEGATIVE ng/mL
Benzodiazepine Screen, Urine: NEGATIVE ng/mL
Buprenorphine, Urine: NEGATIVE ng/mL
Cannabinoid Scrn, Ur: NEGATIVE ng/mL
Carisoprodol, Urine: NEGATIVE ng/mL
Cocaine Metabolites: NEGATIVE ng/mL
Creatinine, Urine: 147.53 mg/dL (ref >20.0)
Fentanyl, Ur: NEGATIVE ng/mL
MDMA URINE: NEGATIVE ng/mL
Meperidine, Ur: NEGATIVE ng/mL
Methadone Screen, Urine: NEGATIVE ng/mL
Methaqualone: NEGATIVE ng/mL
Nitrites, Initial: NEGATIVE ug/mL
Opiate Screen, Urine: NEGATIVE ng/mL
Oxycodone Screen, Ur: NEGATIVE ng/mL
Phencyclidine, Ur: NEGATIVE ng/mL
Propoxyphene: NEGATIVE ng/mL
Tapentadol, urine: NEGATIVE ng/mL
Tramadol Scrn, Ur: NEGATIVE ng/mL
Zolpidem, Urine: NEGATIVE ng/mL
pH, Initial: 6.8 pH (ref 4.5–8.9)

## 2015-04-03 LAB — HEMOGLOBINOPATHY EVALUATION
Hemoglobin Other: 0 %
Hgb A2 Quant: 2 % — ABNORMAL LOW (ref 2.2–3.2)
Hgb A: 98 % — ABNORMAL HIGH (ref 96.8–97.8)
Hgb F Quant: 0 % (ref 0.0–2.0)
Hgb S Quant: 0 %

## 2015-04-04 ENCOUNTER — Inpatient Hospital Stay (HOSPITAL_COMMUNITY)
Admission: AD | Admit: 2015-04-04 | Discharge: 2015-04-04 | Disposition: A | Discharge status: Home / Self Care | Payer: Medicaid Other | Source: Ambulatory Visit | Attending: Obstetrics & Gynecology | Admitting: Obstetrics & Gynecology

## 2015-04-04 DIAGNOSIS — O99333 Smoking (tobacco) complicating pregnancy, third trimester: Secondary | ICD-10-CM | POA: Insufficient documentation

## 2015-04-04 DIAGNOSIS — F17210 Nicotine dependence, cigarettes, uncomplicated: Secondary | ICD-10-CM | POA: Insufficient documentation

## 2015-04-04 DIAGNOSIS — O471 False labor at or after 37 completed weeks of gestation: Secondary | ICD-10-CM

## 2015-04-04 DIAGNOSIS — Z3A39 39 weeks gestation of pregnancy: Secondary | ICD-10-CM | POA: Diagnosis not present

## 2015-04-04 DIAGNOSIS — Z3483 Encounter for supervision of other normal pregnancy, third trimester: Secondary | ICD-10-CM

## 2015-04-04 DIAGNOSIS — O479 False labor, unspecified: Secondary | ICD-10-CM

## 2015-04-04 NOTE — Discharge Instructions (Signed)
Call the clinic or go to [facility] if: °· You begin to have strong, frequent contractions °· Your water breaks.  Sometimes it is a big gush of fluid, sometimes it is just a trickle that keeps getting your panties wet or running down your legs °· You have vaginal bleeding.  It is normal to have a small amount of spotting if your cervix was checked.  °· You don't feel your baby moving like normal.  If you don't, get you something to eat and drink and lay down and focus on feeling your baby move.  You should feel at least 10 movements in 2 hours.  If you don't, you should call the office or go to [facility].  °

## 2015-04-04 NOTE — MAU Note (Signed)
Pt states here for labor eval. Having consistent ctx's though unsure of time between then. No bleeding or lof

## 2015-04-04 NOTE — MAU Provider Note (Signed)
History     CSN: [id number]  Arrival date and time: 04/04/15 1624   None     Chief Complaint  Patient presents with  . Labor Eval   HPI  Patient is 19 y.o. [id number] [redacted]w[redacted]d here for a labor check. Her last cervical exam in the office was 1 cm dilated. She is very anxious about wanting to deliver her baby. She complains of very occasional contractions.  +FM, denies LOF, VB, vaginal discharge.    OB History    Gravida Para Term Preterm AB TAB SAB Ectopic Multiple Living   0 0 0 0 0 0 4      Past Medical History  Diagnosis Date  . Asthma   . ADHD (attention deficit hyperactivity disorder)   . Bipolar 1 disorder   . PTSD (post-traumatic stress disorder)     Past Surgical History  Procedure Laterality Date  . Abdominal hernia repair    . Hernia repair      Family History  Problem Relation Age of Onset  . Diabetes Mother   . Hypertension Mother   . Diabetes Maternal Grandmother   . Hypertension Maternal Grandmother   . Heart disease Maternal Grandmother     History  Substance Use Topics  . Smoking status: Light Tobacco Smoker -- 0.00 packs/day    Types: Cigarettes  . Smokeless tobacco: Not on file  . Alcohol Use: No    Allergies: No Known Allergies  Prescriptions prior to admission  Medication Sig Dispense Refill Last Dose  . acetaminophen (TYLENOL) 500 MG tablet Take 1,000 mg by mouth every 6 (six) hours as needed for mild pain or headache.    Past Month at Unknown time  . albuterol (PROVENTIL HFA;VENTOLIN HFA) 108 (90 BASE) MCG/ACT inhaler Inhale 1-2 puffs into the lungs every 6 (six) hours as needed for wheezing or shortness of breath. (Patient not taking: Reported on 04/04/2015) 1 Inhaler 3 Taking  . albuterol (PROVENTIL HFA;VENTOLIN HFA) 108 (90 BASE) MCG/ACT inhaler Inhale 2 puffs into the lungs every 6 (six) hours as needed for wheezing or shortness of breath. (Patient not taking: Reported on 04/04/2015) 1 Inhaler 2   . Prenatal Multivit-Min-Fe-FA  (PRENATAL VITAMINS) 0.8 MG tablet Take 1 tablet by mouth daily. (Patient not taking: Reported on 04/04/2015) 30 tablet 12     Review of Systems  Constitutional: Negative for fever, chills and malaise/fatigue.  HENT: Negative for congestion.   Eyes: Negative for blurred vision and double vision.  Respiratory: Negative for cough and shortness of breath.   Cardiovascular: Negative for chest pain, palpitations, claudication and leg swelling.  Gastrointestinal: Negative for heartburn, nausea, vomiting, abdominal pain, diarrhea and constipation.  Genitourinary: Negative for dysuria and hematuria.  Musculoskeletal: Positive for back pain. Negative for myalgias.  Skin: Negative for itching and rash.  Neurological: Negative for dizziness, loss of consciousness and headaches.   Physical Exam   Blood pressure 135/79, pulse 126, temperature 98.1 F (36.7 C), temperature source Oral, resp. rate 18, height  (1.6 m), weight 189 lb 6 oz (85.9 kg), last menstrual period 07/03/2014, not currently breastfeeding.  Physical Exam  Constitutional: She is oriented to person, place, and time. She appears well-developed and well-nourished. No distress.  HENT:  Head: Normocephalic and atraumatic.  Eyes: Conjunctivae and EOM are normal.  Neck: Normal range of motion. No thyromegaly present.  Cardiovascular: Normal rate, regular rhythm and normal heart sounds.  Exam reveals no gallop and no friction rub.   No murmur  heard. Respiratory: Breath sounds normal. No respiratory distress. She has no wheezes. She has no rales.  GI: Soft. Bowel sounds are normal. She exhibits no distension. There is no tenderness.  Musculoskeletal: Normal range of motion. She exhibits no edema.  Neurological: She is alert and oriented to person, place, and time.  Skin: Skin is warm and dry. No rash noted. No erythema.  Psychiatric: She has a normal mood and affect. Her behavior is normal.  Dilation: 1.5 Effacement (%): 50 Cervical  Position: Posterior Station: -3 Presentation: Vertex Exam by:: SBeck, RN  Repeated cervical exam 2 hours later without change.   FHR: baseline 140, good variability, + accels, no decels Toco: 1-2 ctx observed during her MAU stay  MAU Course  Procedures  MDM Subsequent cervical exams performed.   Assessment and Plan  Patient is 19 y.o. [id number] 6059w2d reporting contractions and concern that she is in active labor.  - fetal kick counts reinforced -patient asked to be induced or have her membranes striped; informed her that those practices were not warranted in her situation  - counseled on cervical change and contractions needed to reach adequate active labor -advised pt to take tylenol, take warm baths, and place heating pads on back for discomfort  -counseled to return for VB, increase in frequency and intensity of ctx, lack of FM, and LOF  -safe to discharge home   De HollingsheadCatherine L Wallace 04/04/2015, 6:12 PM   I spoke with and examined patient and agree with resident/PA/SNM's note and plan of care.  Irregular uc's, Cat I FHR, no cervical change- not in active labor. We were asked to see pt b/c she was very anxious/wanting to have baby/wanting to be induced. As noted above we discussed w/ her that she is not in labor and at this time we are unable to do anything to help put her into labor. She is to keep her next appt in clinic as scheduled. Reviewed labor s/s, fkc, reasons to return.  Cheral MarkerKimberly R. Keary Hanak, CNM, WHNP-BC 04/04/2015 7:00 PM

## 2015-04-04 NOTE — MAU Note (Signed)
Pt was told by provider she would be sent home after discussing plan of care so pt left but did not wait to get discharge papers or sign epad.

## 2015-04-06 ENCOUNTER — Telehealth: Payer: Self-pay | Admitting: *Deleted

## 2015-04-06 NOTE — Telephone Encounter (Signed)
Pt called and left a message to have a nurse call her back.

## 2015-04-07 LAB — CYSTIC FIBROSIS DIAGNOSTIC STUDY

## 2015-04-07 NOTE — Telephone Encounter (Signed)
Patient called and stated that she has solved her problem. She wanted to know when she will be induced, I advised her that we don't schedule inductions until she is post dates.

## 2015-04-08 ENCOUNTER — Ambulatory Visit (INDEPENDENT_AMBULATORY_CARE_PROVIDER_SITE_OTHER): Payer: Medicaid Other | Admitting: Certified Nurse Midwife

## 2015-04-08 VITALS — BP 124/72 | HR 111 | Temp 98.5°F | Wt 189.7 lb

## 2015-04-08 DIAGNOSIS — Z3483 Encounter for supervision of other normal pregnancy, third trimester: Secondary | ICD-10-CM | POA: Diagnosis not present

## 2015-04-08 DIAGNOSIS — O0932 Supervision of pregnancy with insufficient antenatal care, second trimester: Secondary | ICD-10-CM | POA: Diagnosis not present

## 2015-04-08 DIAGNOSIS — O0933 Supervision of pregnancy with insufficient antenatal care, third trimester: Secondary | ICD-10-CM | POA: Diagnosis not present

## 2015-04-08 LAB — POCT URINALYSIS DIP (DEVICE)
Bilirubin Urine: NEGATIVE
Glucose, UA: NEGATIVE mg/dL
Hgb urine dipstick: NEGATIVE
Ketones, ur: NEGATIVE mg/dL
Nitrite: NEGATIVE
Protein, ur: 30 mg/dL — AB
Specific Gravity, Urine: 1.025 (ref 1.005–1.030)
Urobilinogen, UA: 0.2 mg/dL (ref 0.0–1.0)
pH: 7 (ref 5.0–8.0)

## 2015-04-08 NOTE — Patient Instructions (Signed)
Labor Induction  Labor induction is when steps are taken to cause a pregnant woman to begin the labor process. Most women go into labor on their own between 37 weeks and 42 weeks of the pregnancy. When this does not happen or when there is a medical need, methods may be used to induce labor. Labor induction causes a pregnant woman's uterus to contract. It also causes the cervix to soften (ripen), open (dilate), and thin out (efface). Usually, labor is not induced before 39 weeks of the pregnancy unless there is a problem with the baby or mother.  Before inducing labor, your health care provider will consider a number of factors, including the following:  The medical condition of you and the baby.   How many weeks along you are.   The status of the baby's lung maturity.   The condition of the cervix.   The position of the baby.  WHAT ARE THE REASONS FOR LABOR INDUCTION? Labor may be induced for the following reasons:  The health of the baby or mother is at risk.   The pregnancy is overdue by 1 week or more.   The water breaks but labor does not start on its own.   The mother has a health condition or serious illness, such as high blood pressure, infection, placental abruption, or diabetes.  The amniotic fluid amounts are low around the baby.   The baby is distressed.  Convenience or wanting the baby to be born on a certain date is not a reason for inducing labor. WHAT METHODS ARE USED FOR LABOR INDUCTION? Several methods of labor induction may be used, such as:   Prostaglandin medicine. This medicine causes the cervix to dilate and ripen. The medicine will also start contractions. It can be taken by mouth or by inserting a suppository into the vagina.   Inserting a thin tube (catheter) with a balloon on the end into the vagina to dilate the cervix. Once inserted, the balloon is expanded with water, which causes the cervix to open.   Stripping the membranes. Your health  care provider separates amniotic sac tissue from the cervix, causing the cervix to be stretched and causing the release of a hormone called progesterone. This may cause the uterus to contract. It is often done during an office visit. You will be sent home to wait for the contractions to begin. You will then come in for an induction.   Breaking the water. Your health care provider makes a hole in the amniotic sac using a small instrument. Once the amniotic sac breaks, contractions should begin. This may still take hours to see an effect.   Medicine to trigger or strengthen contractions. This medicine is given through an IV access tube inserted into a vein in your arm.  All of the methods of induction, besides stripping the membranes, will be done in the hospital. Induction is done in the hospital so that you and the baby can be carefully monitored.  HOW LONG DOES IT TAKE FOR LABOR TO BE INDUCED? Some inductions can take up to 2-3 days. Depending on the cervix, it usually takes less time. It takes longer when you are induced early in the pregnancy or if this is your first pregnancy. If a mother is still pregnant and the induction has been going on for 2-3 days, either the mother will be sent home or a cesarean delivery will be needed. WHAT ARE THE RISKS ASSOCIATED WITH LABOR INDUCTION? Some of the risks of induction   include:   Changes in fetal heart rate, such as too high, too low, or erratic.   Fetal distress.   Chance of infection for the mother and baby.   Increased chance of having a cesarean delivery.   Breaking off (abruption) of the placenta from the uterus (rare).   Uterine rupture (very rare).  When induction is needed for medical reasons, the benefits of induction may outweigh the risks. WHAT ARE SOME REASONS FOR NOT INDUCING LABOR? Labor induction should not be done if:   It is shown that your baby does not tolerate labor.   You have had previous surgeries on your  uterus, such as a myomectomy or the removal of fibroids.   Your placenta lies very low in the uterus and blocks the opening of the cervix (placenta previa).   Your baby is not in a head-down position.   The umbilical cord drops down into the birth canal in front of the baby. This could cut off the baby's blood and oxygen supply.   You have had a previous cesarean delivery.   There are unusual circumstances, such as the baby being extremely premature.  Document Released: 01/25/2007 Document Revised: 05/08/2013 Document Reviewed: 04/04/2013 [employer] Patient Information 2015 [employer]. This information is not intended to replace advice given to you by your health care provider. Make sure you discuss any questions you have with your health care provider.  

## 2015-04-08 NOTE — Progress Notes (Signed)
Reports contractions every 2 minutes since last night; does not plan to breastfeed

## 2015-04-08 NOTE — Progress Notes (Signed)
Subjective:  Mary Mitchell is a 19 y.o. G5P4004 at 6236w6d being seen today for ongoing prenatal care.  Patient reports no complaints.  Contractions: Irregular.  Vag. Bleeding: None. Movement: Present. Denies leaking of fluid.   The following portions of the patient's history were reviewed and updated as appropriate: allergies, current medications, past family history, past medical history, past social history, past surgical history and problem list.   Objective:   Filed Vitals:   04/08/15 0847  BP: 124/72  Pulse: 111  Temp: 98.5 F (36.9 C)  Weight: 189 lb 11.2 oz (86.047 kg)    Fetal Status: Fetal Heart Rate (bpm): 145   Movement: Present     General:  Alert, oriented and cooperative. Patient is in no acute distress.  Skin: Skin is warm and dry. No rash noted.   Cardiovascular: Normal heart rate noted  Respiratory: Normal respiratory effort, no problems with respiration noted  Abdomen: Soft, gravid, appropriate for gestational age. Pain/Pressure: Present     Vaginal: Vag. Bleeding: None.    Vag D/C Character: Mucous  Cervix: Exam revealed        Extremities: Normal range of motion.  Edema: None  Mental Status: Normal mood and affect. Normal behavior. Normal judgment and thought content.   Urinalysis: Urine Protein: 1+ Urine Glucose: Negative  Assessment and Plan:  Pregnancy: G5P4004 at 8936w6d  1. Late prenatal care affecting pregnancy in second trimester, antepartum   2. Encounter for supervision of other normal pregnancy in third trimester  3. GBS Positive Term labor symptoms and general obstetric precautions including but not limited to vaginal bleeding, contractions, leaking of fluid and fetal movement were reviewed in detail with the patient. Please refer to After Visit Summary for other counseling recommendations.  Return in about 1 week (around 04/15/2015).   Rhea PinkLori A Ahlam Piscitelli, CNM

## 2015-04-13 ENCOUNTER — Telehealth: Payer: Self-pay

## 2015-04-13 NOTE — Telephone Encounter (Signed)
Called pt and pt informed me that she has transportation issues and that she will not be able to come in any sooner than her appt scheduled on Wednesday, 7/27 for one hour blood draw.  I advised her that if she does not go into labor before then the provider will decide on how to handle her not taking the glucose tolerance test.  Pt states that she does not remember drinking anything for glucose test.  Pt asked about having an induction. I informed pt that the provider on Wednesday will discuss that with her and that because she postdates she would also be doing a NST.  Pt stated understanding.

## 2015-04-13 NOTE — Telephone Encounter (Signed)
-----   Message from Catalina Antigua, MD sent at 04/08/2015  2:55 PM EDT ----- It is not clearly documented why but the patient did not have a 1 hr glucola done or a HgA1c. Can she come in to have 1 hr glucola done?  Thanks  [employer]

## 2015-04-14 ENCOUNTER — Inpatient Hospital Stay (HOSPITAL_COMMUNITY)
Admission: AD | Admit: 2015-04-14 | Discharge: 2015-04-16 | DRG: 775 | Disposition: A | Discharge status: Home / Self Care | Payer: Medicaid Other | Source: Ambulatory Visit | Attending: Obstetrics and Gynecology | Admitting: Obstetrics and Gynecology

## 2015-04-14 ENCOUNTER — Inpatient Hospital Stay (HOSPITAL_COMMUNITY): Payer: Medicaid Other | Admitting: Anesthesiology

## 2015-04-14 ENCOUNTER — Encounter (HOSPITAL_COMMUNITY): Payer: Self-pay | Admitting: Advanced Practice Midwife

## 2015-04-14 DIAGNOSIS — O99334 Smoking (tobacco) complicating childbirth: Secondary | ICD-10-CM | POA: Diagnosis present

## 2015-04-14 DIAGNOSIS — F17210 Nicotine dependence, cigarettes, uncomplicated: Secondary | ICD-10-CM | POA: Diagnosis present

## 2015-04-14 DIAGNOSIS — O99824 Streptococcus B carrier state complicating childbirth: Secondary | ICD-10-CM | POA: Diagnosis present

## 2015-04-14 DIAGNOSIS — Z3A40 40 weeks gestation of pregnancy: Secondary | ICD-10-CM | POA: Diagnosis present

## 2015-04-14 DIAGNOSIS — O480 Post-term pregnancy: Secondary | ICD-10-CM | POA: Diagnosis present

## 2015-04-14 DIAGNOSIS — IMO0001 Reserved for inherently not codable concepts without codable children: Secondary | ICD-10-CM

## 2015-04-14 DIAGNOSIS — Z3483 Encounter for supervision of other normal pregnancy, third trimester: Secondary | ICD-10-CM | POA: Diagnosis present

## 2015-04-14 DIAGNOSIS — O0933 Supervision of pregnancy with insufficient antenatal care, third trimester: Secondary | ICD-10-CM | POA: Diagnosis not present

## 2015-04-14 LAB — CBC
HEMATOCRIT: 31.1 % — AB (ref 36.0–46.0)
HEMOGLOBIN: 9.5 g/dL — AB (ref 12.0–15.0)
MCH: 22.8 pg — AB (ref 26.0–34.0)
MCHC: 30.5 g/dL (ref 30.0–36.0)
MCV: 74.8 fL — AB (ref 78.0–100.0)
Platelets: 201 10*3/uL (ref 150–400)
RBC: 4.16 MIL/uL (ref 3.87–5.11)
RDW: 16.1 % — ABNORMAL HIGH (ref 11.5–15.5)
WBC: 6.6 10*3/uL (ref 4.0–10.5)

## 2015-04-14 LAB — RPR: RPR Ser Ql: NONREACTIVE

## 2015-04-14 LAB — TYPE AND SCREEN
ABO/RH(D): A NEG
Antibody Screen: NEGATIVE

## 2015-04-14 MED ORDER — FENTANYL 2.5 MCG/ML BUPIVACAINE 1/10 % EPIDURAL INFUSION (WH - ANES)
14.0000 mL/h | INTRAMUSCULAR | Status: DC | PRN
  Administered 2015-04-14 (×2): 14 mL/h via EPIDURAL

## 2015-04-14 MED ORDER — FENTANYL 2.5 MCG/ML BUPIVACAINE 1/10 % EPIDURAL INFUSION (WH - ANES)
INTRAMUSCULAR | Status: AC
  Filled 2015-04-14: qty 125

## 2015-04-14 MED ORDER — SIMETHICONE 80 MG PO CHEW: 80.0000 mg | CHEWABLE_TABLET | ORAL | Status: DC | PRN

## 2015-04-14 MED ORDER — DIBUCAINE 1 % RE OINT: 1.0000 "application " | TOPICAL_OINTMENT | RECTAL | Status: DC | PRN

## 2015-04-14 MED ORDER — LANOLIN HYDROUS EX OINT: TOPICAL_OINTMENT | CUTANEOUS | Status: DC | PRN

## 2015-04-14 MED ORDER — ONDANSETRON HCL 4 MG/2ML IJ SOLN: 4.0000 mg | Freq: Four times a day (QID) | INTRAMUSCULAR | Status: DC | PRN

## 2015-04-14 MED ORDER — OXYCODONE-ACETAMINOPHEN 5-325 MG PO TABS
1.0000 | ORAL_TABLET | ORAL | Status: DC | PRN
  Administered 2015-04-14: 1 via ORAL
  Filled 2015-04-14: qty 1

## 2015-04-14 MED ORDER — OXYTOCIN 40 UNITS IN LACTATED RINGERS INFUSION - SIMPLE MED
62.5000 mL/h | INTRAVENOUS | Status: DC
Start: 2015-04-14 — End: 2015-04-14
  Filled 2015-04-14: qty 1000

## 2015-04-14 MED ORDER — DIPHENHYDRAMINE HCL 25 MG PO CAPS
25.0000 mg | ORAL_CAPSULE | Freq: Four times a day (QID) | ORAL | Status: DC | PRN
  Administered 2015-04-15 – 2015-04-16 (×3): 25 mg via ORAL
  Filled 2015-04-14 (×3): qty 1

## 2015-04-14 MED ORDER — FENTANYL CITRATE (PF) 100 MCG/2ML IJ SOLN: 50.0000 ug | INTRAMUSCULAR | Status: DC | PRN

## 2015-04-14 MED ORDER — PRENATAL MULTIVITAMIN CH
1.0000 | ORAL_TABLET | Freq: Every day | ORAL | Status: DC
  Administered 2015-04-15: 1 via ORAL
  Filled 2015-04-14 (×2): qty 1

## 2015-04-14 MED ORDER — ACETAMINOPHEN 325 MG PO TABS: 650.0000 mg | ORAL_TABLET | ORAL | Status: DC | PRN

## 2015-04-14 MED ORDER — OXYCODONE-ACETAMINOPHEN 5-325 MG PO TABS: 2.0000 | ORAL_TABLET | ORAL | Status: DC | PRN

## 2015-04-14 MED ORDER — ONDANSETRON HCL 4 MG/2ML IJ SOLN: 4.0000 mg | INTRAMUSCULAR | Status: DC | PRN

## 2015-04-14 MED ORDER — CITRIC ACID-SODIUM CITRATE 334-500 MG/5ML PO SOLN: 30.0000 mL | ORAL | Status: DC | PRN

## 2015-04-14 MED ORDER — LACTATED RINGERS IV SOLN
500.0000 mL | INTRAVENOUS | Status: DC | PRN
Start: 2015-04-14 — End: 2015-04-14

## 2015-04-14 MED ORDER — OXYCODONE-ACETAMINOPHEN 5-325 MG PO TABS
2.0000 | ORAL_TABLET | ORAL | Status: DC | PRN
  Administered 2015-04-14 – 2015-04-16 (×7): 2 via ORAL
  Filled 2015-04-14 (×7): qty 2

## 2015-04-14 MED ORDER — OXYCODONE-ACETAMINOPHEN 5-325 MG PO TABS: 1.0000 | ORAL_TABLET | ORAL | Status: DC | PRN

## 2015-04-14 MED ORDER — PHENYLEPHRINE 40 MCG/ML (10ML) SYRINGE FOR IV PUSH (FOR BLOOD PRESSURE SUPPORT)
PREFILLED_SYRINGE | INTRAVENOUS | Status: AC
  Filled 2015-04-14: qty 20

## 2015-04-14 MED ORDER — EPHEDRINE 5 MG/ML INJ
10.0000 mg | INTRAVENOUS | Status: DC | PRN
  Filled 2015-04-14: qty 2

## 2015-04-14 MED ORDER — LIDOCAINE HCL (PF) 1 % IJ SOLN
INTRAMUSCULAR | Status: DC | PRN
  Administered 2015-04-14 (×2): 8 mL via EPIDURAL

## 2015-04-14 MED ORDER — BENZOCAINE-MENTHOL 20-0.5 % EX AERO
1.0000 "application " | INHALATION_SPRAY | CUTANEOUS | Status: DC | PRN
  Administered 2015-04-14: 1 via TOPICAL
  Filled 2015-04-14: qty 56

## 2015-04-14 MED ORDER — FLEET ENEMA 7-19 GM/118ML RE ENEM: 1.0000 | ENEMA | RECTAL | Status: DC | PRN

## 2015-04-14 MED ORDER — LACTATED RINGERS IV SOLN
INTRAVENOUS | Status: DC
  Administered 2015-04-14: 125 mL/h via INTRAVENOUS

## 2015-04-14 MED ORDER — SENNOSIDES-DOCUSATE SODIUM 8.6-50 MG PO TABS
2.0000 | ORAL_TABLET | ORAL | Status: DC
  Administered 2015-04-15 (×2): 2 via ORAL
  Filled 2015-04-14 (×2): qty 2

## 2015-04-14 MED ORDER — SODIUM CHLORIDE 0.9 % IV SOLN
2.0000 g | Freq: Once | INTRAVENOUS | Status: AC
  Administered 2015-04-14: 2 g via INTRAVENOUS
  Filled 2015-04-14: qty 2000

## 2015-04-14 MED ORDER — OXYTOCIN BOLUS FROM INFUSION
500.0000 mL | INTRAVENOUS | Status: DC
Start: 2015-04-14 — End: 2015-04-14

## 2015-04-14 MED ORDER — WITCH HAZEL-GLYCERIN EX PADS: 1.0000 "application " | MEDICATED_PAD | CUTANEOUS | Status: DC | PRN

## 2015-04-14 MED ORDER — TETANUS-DIPHTH-ACELL PERTUSSIS 5-2.5-18.5 LF-MCG/0.5 IM SUSP: 0.5000 mL | Freq: Once | INTRAMUSCULAR | Status: DC

## 2015-04-14 MED ORDER — IBUPROFEN 600 MG PO TABS
600.0000 mg | ORAL_TABLET | Freq: Four times a day (QID) | ORAL | Status: DC
  Administered 2015-04-14 – 2015-04-16 (×8): 600 mg via ORAL
  Filled 2015-04-14 (×9): qty 1

## 2015-04-14 MED ORDER — PHENYLEPHRINE 40 MCG/ML (10ML) SYRINGE FOR IV PUSH (FOR BLOOD PRESSURE SUPPORT)
80.0000 ug | PREFILLED_SYRINGE | INTRAVENOUS | Status: DC | PRN
  Filled 2015-04-14: qty 2

## 2015-04-14 MED ORDER — ONDANSETRON HCL 4 MG PO TABS: 4.0000 mg | ORAL_TABLET | ORAL | Status: DC | PRN

## 2015-04-14 MED ORDER — ZOLPIDEM TARTRATE 5 MG PO TABS: 5.0000 mg | ORAL_TABLET | Freq: Every evening | ORAL | Status: DC | PRN

## 2015-04-14 MED ORDER — LIDOCAINE HCL (PF) 1 % IJ SOLN
30.0000 mL | INTRAMUSCULAR | Status: DC | PRN
  Filled 2015-04-14: qty 30

## 2015-04-14 MED ORDER — DIPHENHYDRAMINE HCL 50 MG/ML IJ SOLN: 12.5000 mg | INTRAMUSCULAR | Status: DC | PRN

## 2015-04-14 MED ORDER — SODIUM CHLORIDE 0.9 % IV SOLN: 2.0000 g | Freq: Once | INTRAVENOUS | Status: DC

## 2015-04-14 NOTE — Anesthesia Preprocedure Evaluation (Signed)
Anesthesia Evaluation  Patient identified by MRN, date of birth, ID band Patient awake    Reviewed: Allergy & Precautions, H&P , NPO status , Patient's Chart, lab work & pertinent test results  Airway Mallampati: I  TM Distance: >3 FB Neck ROM: full    Dental no notable dental hx.    Pulmonary Current Smoker,    Pulmonary exam normal        Cardiovascular negative cardio ROS Normal cardiovascular exam     Neuro/Psych negative neurological ROS     GI/Hepatic negative GI ROS, Neg liver ROS,   Endo/Other  negative endocrine ROS  Renal/GU negative Renal ROS     Musculoskeletal   Abdominal (+) + obese,   Peds  Hematology negative hematology ROS (+)   Anesthesia Other Findings   Reproductive/Obstetrics (+) Pregnancy                             Anesthesia Physical Anesthesia Plan  ASA: II  Anesthesia Plan: Epidural   Post-op Pain Management:    Induction:   Airway Management Planned:   Additional Equipment:   Intra-op Plan:   Post-operative Plan:   Informed Consent: I have reviewed the patients History and Physical, chart, labs and discussed the procedure including the risks, benefits and alternatives for the proposed anesthesia with the patient or authorized representative who has indicated his/her understanding and acceptance.     Plan Discussed with:   Anesthesia Plan Comments:         Anesthesia Quick Evaluation  

## 2015-04-14 NOTE — Anesthesia Postprocedure Evaluation (Signed)
  Anesthesia Post-op Note  Patient: Mary Mitchell  Procedure(s) Performed: * No procedures listed *  Patient Location: Mother/Baby  Anesthesia Type:Epidural  Level of Consciousness: awake, alert  and oriented  Airway and Oxygen Therapy: Patient Spontanous Breathing  Post-op Pain: none  Post-op Assessment: Post-op Vital signs reviewed and Patient's Cardiovascular Status Stable              Post-op Vital Signs: Reviewed and stable  Last Vitals:  Filed Vitals:   04/14/15 1258  BP: 112/68  Pulse: 85  Temp: 37 C  Resp: 16    Complications: No apparent anesthesia complications

## 2015-04-14 NOTE — Progress Notes (Signed)
SVE: 9.5 cm (mostly anterior and right)/100/-2 AROM with clear fluid and good descent after ROM.   Continued to monitor and expectant management.  Mary Mitchell

## 2015-04-14 NOTE — H&P (Signed)
HPI: Mary Mitchell is a 19 y.o. year old G87P4004 female at [redacted]w[redacted]d weeks gestation who presents to [facility] reporting Labor. Late/limited PNC. 2 visits. Hx shoulder dystocia w/ G3 (6 lb, 9 oz)  Dilation: 9 Effacement (%): 100 Cervical Position: Middle, Anterior Station: -2 Exam by:: Mary Mitchell  Clinic  [facility] Prenatal Labs  Dating Ultrasound c/w lmp Blood type: --/--/A NEG, A NEG (04/11 1235)   Genetic Screen Too late Antibody:NEG (04/11 1235)  Anatomic [place] normal Rubella: 1.98 (06/23 1515)  GTT Early: Third trimester: Missed. Glucose 71 on CMET 11/06/14 RPR: Non Reactive (06/23 1515)   Flu vaccine  HBsAg: Negative (06/23 1515)   TDaP vaccine        04/01/15                        Rhogam: 12/29/14 HIV: Non-reactive (02/18 0000)   Baby Food  Formula                                             [id number] (06/23 0000)(For PCN allergy, check sensitivities)  Contraception  Nexplanon Pap:  Circumcision    Pediatrician    Support Person      Patient Active Problem List   Diagnosis Date Noted  . Active labor 04/14/2015  . Supervision of normal subsequent pregnancy 04/01/2015  . Fall during current hospitalization   . No prenatal care in current pregnancy in third trimester   . Encounter for fetal anatomic survey   . Late prenatal care affecting pregnancy in second trimester, antepartum   . Rh negative state in antepartum period 01/31/2014  . Mild intermittent asthma 01/30/2014  . Tobacco abuse 01/30/2014     History OB History    Gravida Para Term Preterm AB TAB SAB Ectopic Multiple Living   0 0 0 0 0 0 4     Past Medical History  Diagnosis Date  . Asthma   . ADHD (attention deficit hyperactivity disorder)   . Bipolar 1 disorder   . PTSD (post-traumatic stress disorder)    Past Surgical History  Procedure Laterality Date  . Abdominal hernia repair    . Hernia repair     Family History: family history includes Diabetes in her maternal grandmother and mother; Heart disease  in her maternal grandmother; Hypertension in her maternal grandmother and mother. Social History:  reports that she has been smoking Cigarettes.  She has been smoking about 0.00 packs per day. She does not have any smokeless tobacco history on file. She reports that she does not drink alcohol or use illicit drugs.   Prenatal Transfer Tool  Maternal Diabetes: No Genetic Screening: Declined Maternal Ultrasounds/Referrals: Normal Fetal Ultrasounds or other Referrals:  None Maternal Substance Abuse:  No Significant Maternal Medications:  None Significant Maternal Lab Results:  Lab values include: Group B Strep positive, Rh negative Other Comments:  Late/limited PNC. 2 visits. GBS-Amp, inadequate prophylaxis. Missed GTT. Normal random Glucose.   Review of Systems  Constitutional: Negative for fever and chills.  Eyes: Negative for blurred vision.  Gastrointestinal: Positive for abdominal pain (contractions only).  Genitourinary:       No LOF, VB  Neurological: Negative for headaches.    Dilation: 9 Effacement (%): 100 Station: -2 Exam by:: Mary Mitchell Blood pressure 133/80, pulse 115, temperature 98.6 F (37 C), temperature source Oral, resp.  rate 20, last menstrual period 07/03/2014, not currently breastfeeding. Maternal Exam:  Uterine Assessment: Contraction strength is firm.  Contraction frequency is irregular.   Abdomen: Patient reports no abdominal tenderness. Fetal presentation: vertex  Introitus: Normal vulva. Normal vagina.  Pelvis: adequate for delivery.   Cervix: Cervix evaluated by digital exam.     Fetal Exam Fetal Monitor Review: Mode: ultrasound.   Baseline rate: 150.  Variability: moderate (6-25 bpm).   Pattern: accelerations present and prolonged decelerations.   2.5 min prolonged decel to 70's. Few mild variables.   Fetal State Assessment: Category II - tracings are indeterminate.     Physical Exam  Constitutional: She is oriented to person, place, and time.  She appears well-developed. She appears distressed.  Eyes: Conjunctivae are normal.  Cardiovascular: Tachycardia present.   Respiratory: Effort normal. No respiratory distress.  GI: Soft. There is no tenderness.  Neurological: She is alert and oriented to person, place, and time.  Psychiatric: She has a normal mood and affect.    Prenatal labs: ABO, Rh: --/--/A NEG, A NEG (04/11 1235) Antibody: NEG (04/11 1235) Rubella: 1.98 (06/23 1515) RPR: Non Reactive (06/23 1515)  HBsAg: Negative (06/23 1515)  HIV: Non-reactive (02/18 0000)  GBS: Positive (06/23 0000)   Assessment: 1. Labor: Transition 2. Fetal Wellbeing: Category II, but fetal status overall reassuring.   3. Pain Control: Requesting epidural 4. GBS: Pos 5. 40.5 week IUP 6. Late/min PNC- short interval pregnancy  Plan:  1. Admit to BS per consult with MD 2. Routine L&D orders 3. Analgesia/anesthesia PRN  4. Ampicllin 5. SW consult PP.  [name], [name] 04/14/2015, 9:09 AM  I reviewed and updated the history with my exam components.  Mary Flake, MD

## 2015-04-14 NOTE — Anesthesia Procedure Notes (Signed)
Epidural Patient location during procedure: OB Start time: 04/14/2015 9:41 AM End time: 04/14/2015 9:45 AM  Staffing Anesthesiologist: Leilani Able Performed by: anesthesiologist   Preanesthetic Checklist Completed: patient identified, surgical consent, pre-op evaluation, timeout performed, IV checked, risks and benefits discussed and monitors and equipment checked  Epidural Patient position: sitting Prep: site prepped and draped and DuraPrep Patient monitoring: continuous pulse ox and blood pressure Approach: midline Location: L3-L4 Injection technique: LOR air  Needle:  Needle type: Tuohy  Needle gauge: 17 G Needle length: 9 cm and 9 Needle insertion depth: 5 cm cm Catheter type: closed end flexible Catheter size: 19 Gauge Catheter at skin depth: 10 cm Test dose: negative and Other  Assessment Sensory level: T9 Events: blood not aspirated, injection not painful, no injection resistance, negative IV test and no paresthesia  Additional Notes Reason for block:procedure for pain

## 2015-04-14 NOTE — MAU Note (Signed)
cntractions started last night. 5th baby, was 1 when last checked.

## 2015-04-15 ENCOUNTER — Encounter: Payer: Medicaid Other | Admitting: Certified Nurse Midwife

## 2015-04-15 NOTE — Plan of Care (Signed)
Problem: Phase II Progression Outcomes Goal: Rh isoimmunization per orders Outcome: Not Applicable Date Met:  85/88/50 Mom A-/baby A-; Rhogam not needed

## 2015-04-15 NOTE — Clinical Social Work Maternal (Signed)
CLINICAL SOCIAL WORK MATERNAL/CHILD NOTE  Patient Details  Name: Mary Mitchell MRN: [id number] Date of Birth: 02/24/1996  Date:  04/15/2015  Clinical Social Worker Initiating Note:  Mary Zaragoza, LCSW Date/ Time Initiated:  04/15/15/1430     Child's Name:  Mary Mitchell   Legal Guardian:  Mary Mitchell and Mary Mitchell   Need for Interpreter:  None   Date of Referral:  04/14/15     Reason for Referral:  Hx of bipolar, Late or No Prenatal Care    Referral Source:  Central Nursery   Address:  [address] [place], [place] [address]  Phone number:  [phone]   Household Members:  Minor Children, Significant Other, Parents, Siblings   Natural Supports (not living in the home):  Immediate Family, Extended Family   Professional Supports: None   Employment: Full-time   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 Per chart review, [name] last participated in treatment 19 years old. [name] endorsed significant symptoms of depression during her previous pregnancy and was resistant to participating in treatment. [name] was closed and guarded related to recent mental health history.  2) LPNC: [name] initiated care at [redacted]w[redacted]d. Per [name], she was unable to attend appointments due to her work schedule.    Cognitive State:  Able to Concentrate , Alert , Goal Oriented    Mood/Affect:  Flat , Tired  CSW Assessment:  CSW received request for consult due to [name] presenting with LPNC and history of bipolar.  [name] presented as difficult to engage and was a limited historian. She provided short and concise answers,  and presented as minimally receptive to CSW assistance/support. This CSW met with [name] in September 2015 after her first child was born, and is familiar with [name], and noted that [name] was more easily and readily engaged during last admission.  [name] presented with a flat affect, displayed a limited range in affect, no eye contact made with CSW. [name] shared that she is exhausted and tired.   [name] stated that she does not remember CSW from previous admission,and shared that everything has been "fine" since her last child has been born.  CSW reviewed that during her previous pregnancy, she experienced significant symptoms of depression.  [name] shared that everything is "better" and "it was just a phase".  CSW attempted to elicit maternal strengths that assisted her, but she stated that she "got over it".  [name] denied any postpartum depression and denied mental health concerns during the pregnancy.  Per chart review and previous CSW assessment, [name] was diagnosed with bipolar during adolescence, and has not had any treatment in approximately 5 years.   [name] stated that she continues to live with the FOB, her other children, her mother, and her siblings.  She stated that her family members are helping her with childcare while she is at the hospital. [name] reported that she has all baby items for this infant, and reported feeling "fine" and "happy" about the arrival of this infant.   Per [name], LPNC was due to work schedule. She denied additional barriers to accessing care, and   denied any barriers to care postpartum.    [name] denied questions, concerns, or needs at this time.   CSW Plan/Description:   1)Patient/Family Education : Hospital drug screen policy 2)CSW to monitor infant drug screens, and will make a CPS report if positive. 3) No Further Intervention Required/No Barriers to Discharge    Mary Mitchell N, LCSW 04/15/2015, 3:45 PM  

## 2015-04-15 NOTE — Progress Notes (Signed)
Post Partum Day 1 Subjective: no complaints, up ad lib, voiding and tolerating PO  Objective: Blood pressure 114/58, pulse 92, temperature 97.9 F (36.6 C), temperature source Oral, resp. rate 20, last menstrual period 07/03/2014, SpO2 100 %, unknown if currently breastfeeding.  Physical Exam:  General: alert, cooperative and no distress Lochia: appropriate Uterine Fundus: firm Incision: healing well DVT Evaluation: No evidence of DVT seen on physical exam.   Recent Labs  04/14/15 0900  HGB 9.5*  HCT 31.1*    Assessment/Plan: Plan for discharge tomorrow and Contraception Nexplanon   LOS: 1 day   [facility] 04/15/2015, 8:00 AM

## 2015-04-16 MED ORDER — IBUPROFEN 600 MG PO TABS: 600.0000 mg | ORAL_TABLET | Freq: Four times a day (QID) | ORAL | Status: DC | PRN

## 2015-04-16 MED ORDER — OXYCODONE-ACETAMINOPHEN 5-325 MG PO TABS: 1.0000 | ORAL_TABLET | Freq: Four times a day (QID) | ORAL | Status: DC | PRN

## 2015-04-16 NOTE — Discharge Instructions (Signed)
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
## 2015-04-20 ENCOUNTER — Ambulatory Visit: Payer: Medicaid Other | Admitting: Obstetrics and Gynecology

## 2015-05-14 ENCOUNTER — Encounter: Payer: Self-pay | Admitting: Family Medicine

## 2015-05-14 ENCOUNTER — Ambulatory Visit: Payer: Medicaid Other | Admitting: Obstetrics & Gynecology

## 2015-05-27 ENCOUNTER — Ambulatory Visit: Payer: Medicaid Other | Admitting: Obstetrics & Gynecology

## 2015-06-08 ENCOUNTER — Emergency Department (HOSPITAL_COMMUNITY)
Admission: EM | Admit: 2015-06-08 | Discharge: 2015-06-09 | Disposition: A | Discharge status: Home / Self Care | Payer: No Typology Code available for payment source | Attending: Emergency Medicine | Admitting: Emergency Medicine

## 2015-06-08 ENCOUNTER — Encounter (HOSPITAL_COMMUNITY): Payer: Self-pay | Admitting: Emergency Medicine

## 2015-06-08 DIAGNOSIS — J45909 Unspecified asthma, uncomplicated: Secondary | ICD-10-CM | POA: Insufficient documentation

## 2015-06-08 DIAGNOSIS — Y9289 Other specified places as the place of occurrence of the external cause: Secondary | ICD-10-CM | POA: Insufficient documentation

## 2015-06-08 DIAGNOSIS — Z8659 Personal history of other mental and behavioral disorders: Secondary | ICD-10-CM | POA: Diagnosis not present

## 2015-06-08 DIAGNOSIS — T7421XA Adult sexual abuse, confirmed, initial encounter: Secondary | ICD-10-CM | POA: Diagnosis present

## 2015-06-08 DIAGNOSIS — IMO0002 Reserved for concepts with insufficient information to code with codable children: Secondary | ICD-10-CM

## 2015-06-08 DIAGNOSIS — Y939 Activity, unspecified: Secondary | ICD-10-CM | POA: Insufficient documentation

## 2015-06-08 DIAGNOSIS — Y999 Unspecified external cause status: Secondary | ICD-10-CM | POA: Insufficient documentation

## 2015-06-08 DIAGNOSIS — R109 Unspecified abdominal pain: Secondary | ICD-10-CM | POA: Insufficient documentation

## 2015-06-08 DIAGNOSIS — Z720 Tobacco use: Secondary | ICD-10-CM | POA: Diagnosis not present

## 2015-06-08 MED ORDER — AZITHROMYCIN 1 G PO PACK: 1.0000 g | PACK | Freq: Once | ORAL | Status: DC

## 2015-06-08 MED ORDER — METRONIDAZOLE 500 MG PO TABS: 2000.0000 mg | ORAL_TABLET | Freq: Once | ORAL | Status: DC

## 2015-06-08 MED ORDER — CEFIXIME 400 MG PO TABS
400.0000 mg | ORAL_TABLET | Freq: Once | ORAL | Status: DC
Start: 2015-06-08 — End: 2015-06-09
  Filled 2015-06-08: qty 1

## 2015-06-08 MED ORDER — ULIPRISTAL ACETATE 30 MG PO TABS
30.0000 mg | ORAL_TABLET | Freq: Once | ORAL | Status: DC
  Filled 2015-06-08: qty 1

## 2015-06-08 MED ORDER — CEFIXIME 400 MG PO CAPS
ORAL_CAPSULE | ORAL | Status: AC
  Administered 2015-06-08: 400 mg
  Filled 2015-06-08: qty 1

## 2015-06-08 MED ORDER — PROMETHAZINE HCL 25 MG PO TABS
25.0000 mg | ORAL_TABLET | Freq: Four times a day (QID) | ORAL | Status: DC | PRN
  Filled 2015-06-08: qty 1

## 2015-06-08 MED ORDER — AZITHROMYCIN 1 G PO PACK
PACK | ORAL | Status: AC
  Administered 2015-06-08: 1 g
  Filled 2015-06-08: qty 1

## 2015-06-08 MED ORDER — PROMETHAZINE HCL 25 MG PO TABS
ORAL_TABLET | ORAL | Status: AC
  Administered 2015-06-08: 25 mg
  Filled 2015-06-08: qty 3

## 2015-06-08 MED ORDER — METRONIDAZOLE 500 MG PO TABS
ORAL_TABLET | ORAL | Status: AC
  Administered 2015-06-08: 2000 mg
  Filled 2015-06-08: qty 4

## 2015-06-08 MED ORDER — ULIPRISTAL ACETATE 30 MG PO TABS
ORAL_TABLET | ORAL | Status: AC
  Administered 2015-06-08: 30 mg
  Filled 2015-06-08: qty 1

## 2015-06-08 NOTE — ED Notes (Signed)
Spoke with Pharmacy stated medications will be sent shortly.
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
## 2015-06-08 NOTE — ED Notes (Signed)
[employer] at bedside 
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
## 2015-06-08 NOTE — ED Notes (Signed)
Spoke with Sec. Jasmine December states attempted to call SANE nurse on call no response.

## 2015-06-08 NOTE — ED Notes (Signed)
Called patient x1, no success.

## 2015-06-08 NOTE — ED Notes (Signed)
SANE to see

## 2015-06-08 NOTE — ED Notes (Signed)
Tried several times to call SANE nurse but message stated their voice mail has not been set up then it hangs up.
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
## 2015-06-08 NOTE — ED Notes (Signed)
SANE nurse given kit.

## 2015-06-08 NOTE — ED Notes (Signed)
Sec. To call SANE nurse.
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
## 2015-06-15 ENCOUNTER — Ambulatory Visit: Payer: Medicaid Other | Admitting: Medical

## 2015-06-15 ENCOUNTER — Telehealth: Payer: Self-pay | Admitting: *Deleted

## 2015-06-15 NOTE — Telephone Encounter (Signed)
Called patient to see if she could come to her appointment earlier. Patient states that she doesn't get off work until 3:30 and will need to reschedule. Advised patient that I would ask front office staff to call her with a new appointment. Patient voiced understanding. Informed Antoinette to call patient.

## 2015-06-22 ENCOUNTER — Ambulatory Visit: Payer: Medicaid Other | Admitting: Medical

## 2016-01-25 ENCOUNTER — Emergency Department (HOSPITAL_COMMUNITY)
Admission: EM | Admit: 2016-01-25 | Discharge: 2016-01-25 | Disposition: A | Discharge status: Home / Self Care | Payer: Medicaid Other

## 2016-01-25 NOTE — ED Notes (Signed)
Pt called for triage x3 with no answer

## 2016-02-11 ENCOUNTER — Other Ambulatory Visit (HOSPITAL_COMMUNITY)
Admission: RE | Admit: 2016-02-11 | Discharge: 2016-02-11 | Disposition: A | Discharge status: Home / Self Care | Payer: Medicaid Other | Source: Ambulatory Visit | Attending: Obstetrics & Gynecology | Admitting: Obstetrics & Gynecology

## 2016-02-11 ENCOUNTER — Ambulatory Visit (INDEPENDENT_AMBULATORY_CARE_PROVIDER_SITE_OTHER): Payer: Medicaid Other | Admitting: *Deleted

## 2016-02-11 DIAGNOSIS — Z3201 Encounter for pregnancy test, result positive: Secondary | ICD-10-CM

## 2016-02-11 DIAGNOSIS — Z113 Encounter for screening for infections with a predominantly sexual mode of transmission: Secondary | ICD-10-CM

## 2016-02-11 DIAGNOSIS — Z3200 Encounter for pregnancy test, result unknown: Secondary | ICD-10-CM

## 2016-02-11 DIAGNOSIS — Z3490 Encounter for supervision of normal pregnancy, unspecified, unspecified trimester: Secondary | ICD-10-CM

## 2016-02-11 LAB — POCT PREGNANCY, URINE: Preg Test, Ur: POSITIVE — AB

## 2016-02-11 NOTE — Progress Notes (Signed)
Here for pregnancy test- which was positive. Would like to start prenatal care here. LMP 10/21/15- states periods are regular. LMP makes her [id number].  Will give proof of pregnancy. Will schedule US for anatomy. Will start labs today

## 2016-02-12 LAB — PRENATAL PROFILE (SOLSTAS)
Antibody Screen: NEGATIVE
BASOS ABS: 0 {cells}/uL (ref 0–200)
Basophils Relative: 0 %
Eosinophils Absolute: 57 cells/uL (ref 15–500)
Eosinophils Relative: 1 %
HEMATOCRIT: 40.2 % (ref 35.0–45.0)
HEP B S AG: NEGATIVE
HIV 1&2 Ab, 4th Generation: NONREACTIVE
Hemoglobin: 12.9 g/dL (ref 11.7–15.5)
LYMPHS ABS: 1995 {cells}/uL (ref 850–3900)
Lymphocytes Relative: 35 %
MCH: 24.7 pg — ABNORMAL LOW (ref 27.0–33.0)
MCHC: 32.1 g/dL (ref 32.0–36.0)
MCV: 76.9 fL — ABNORMAL LOW (ref 80.0–100.0)
MPV: 9.3 fL (ref 7.5–12.5)
Monocytes Absolute: 342 cells/uL (ref 200–950)
Monocytes Relative: 6 %
Neutro Abs: 3306 cells/uL (ref 1500–7800)
Neutrophils Relative %: 58 %
PLATELETS: 283 10*3/uL (ref 140–400)
RBC: 5.23 MIL/uL — ABNORMAL HIGH (ref 3.80–5.10)
RDW: 14.8 % (ref 11.0–15.0)
Rh Type: NEGATIVE
Rubella: 2.18 Index — ABNORMAL HIGH (ref <0.90)
WBC: 5.7 10*3/uL (ref 3.8–10.8)

## 2016-02-12 LAB — GC/CHLAMYDIA PROBE AMP (~~LOC~~) NOT AT ARMC
Chlamydia: NEGATIVE
Neisseria Gonorrhea: NEGATIVE

## 2016-02-12 IMAGING — US US OB COMP +14 WK
1 series · 12 of 28 positions shown · non-contrast
Comparison: none

[Series 1: us ob +14 all · 82 acquisitions, 12 frames shown]
[im 4/82]
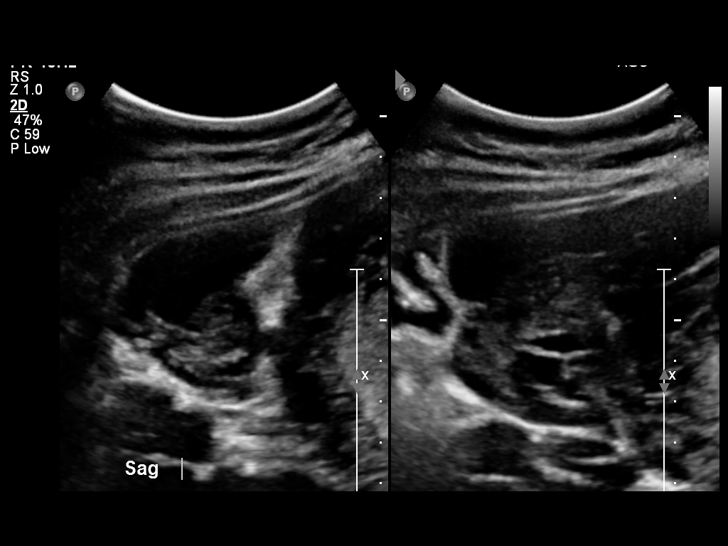
[im 10/82]
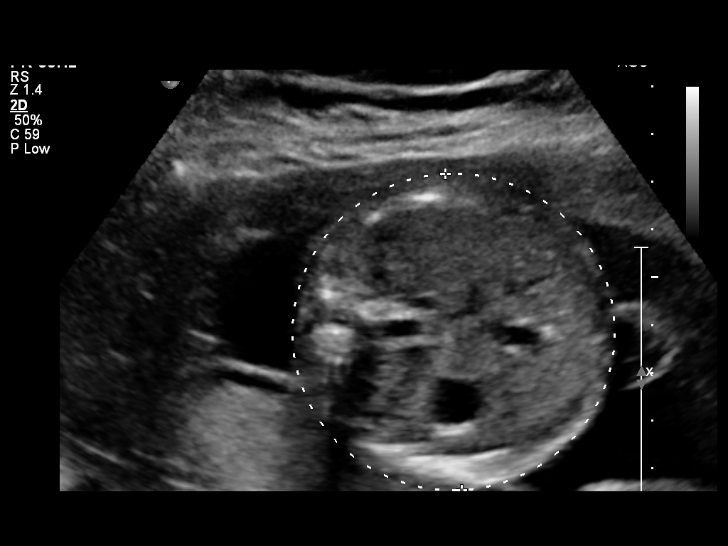
[im 16/82]
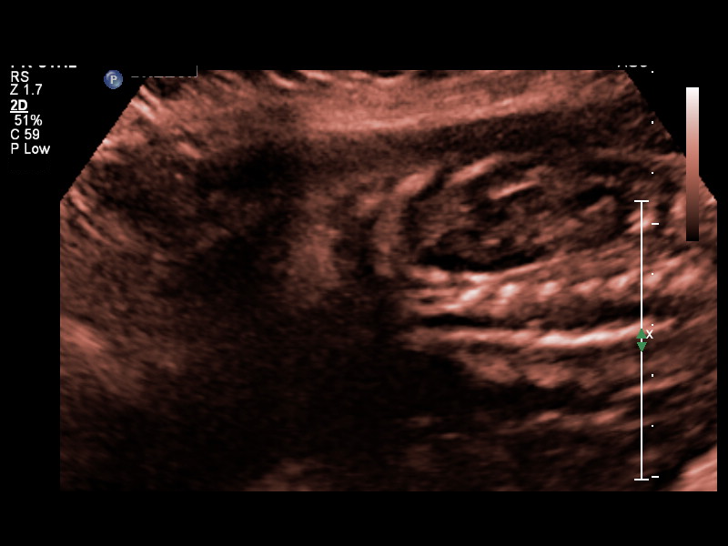
[im 25/82]
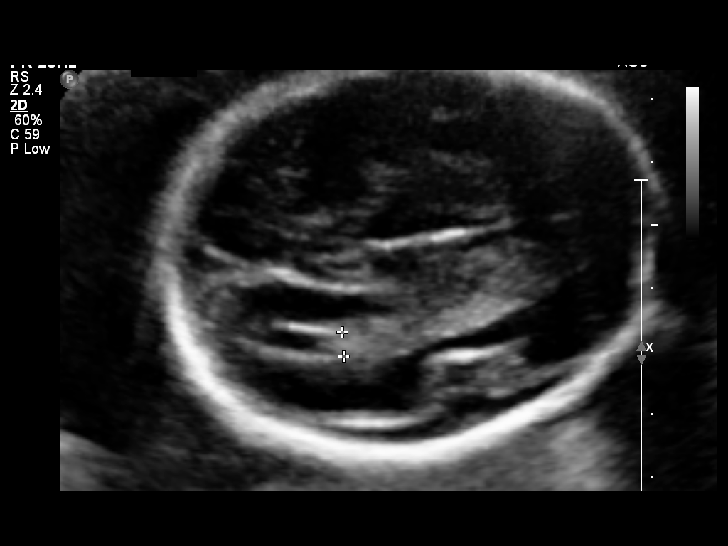
[im 31/82]
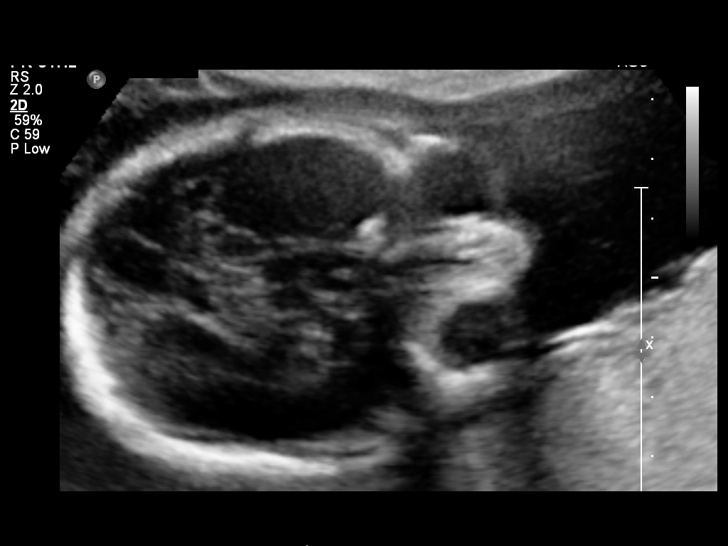
[im 37/82]
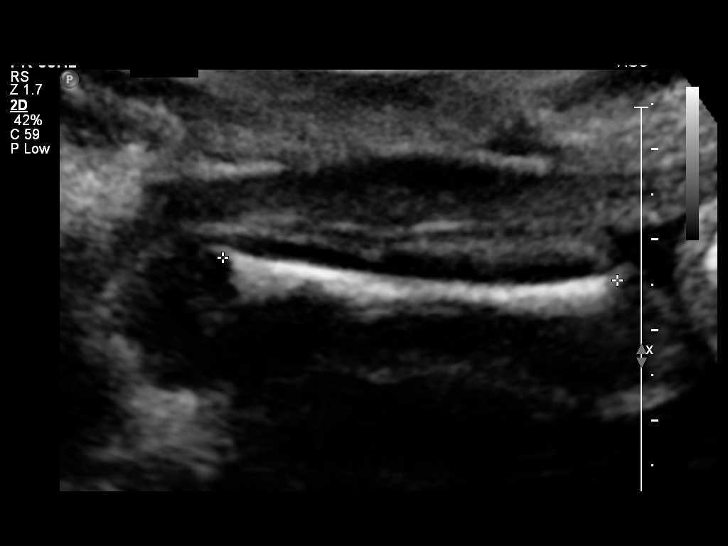
[im 46/82]
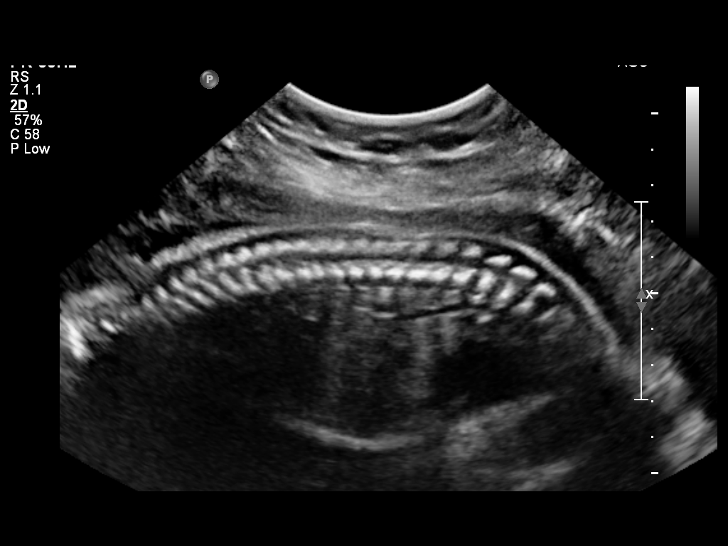
[im 52/82]
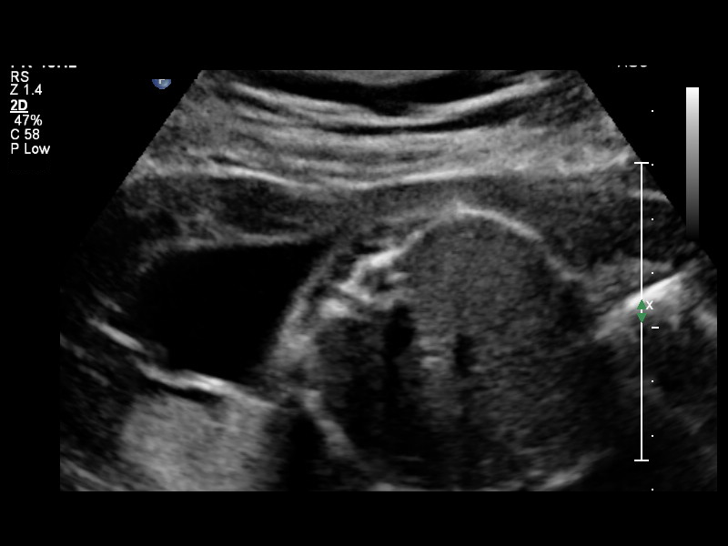
[im 58/82]
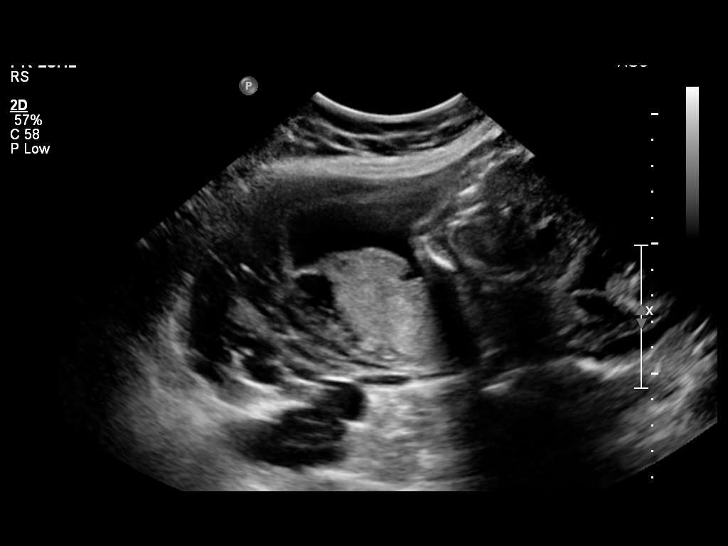
[im 67/82]
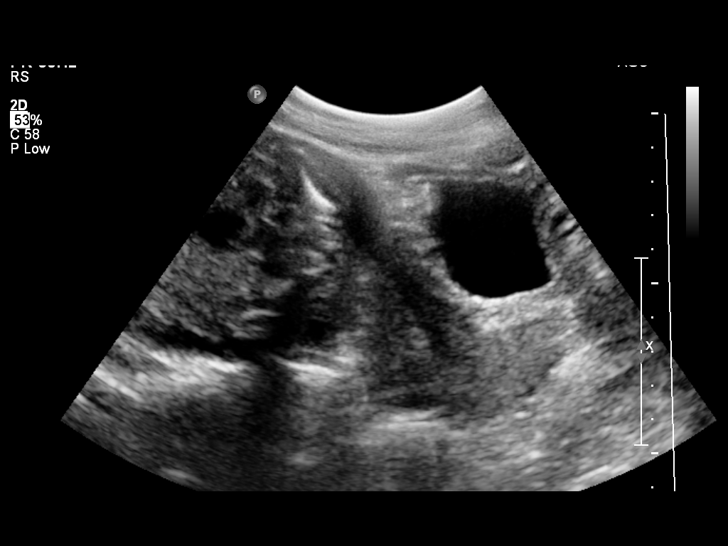
[im 73/82]
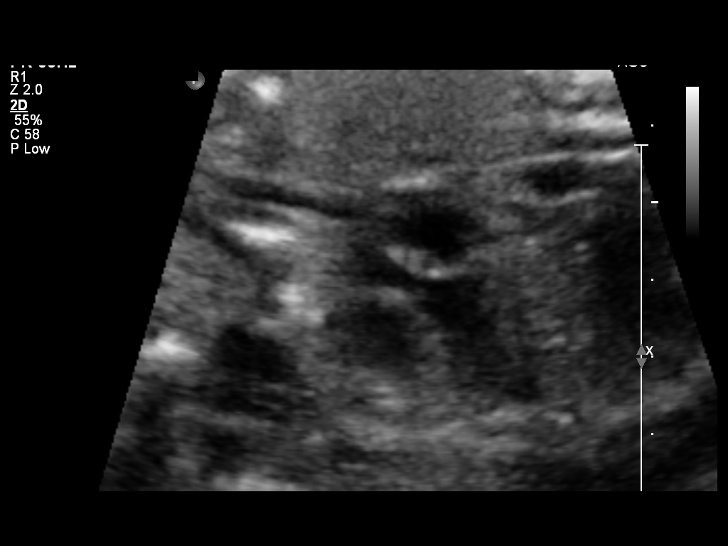
[im 79/82]
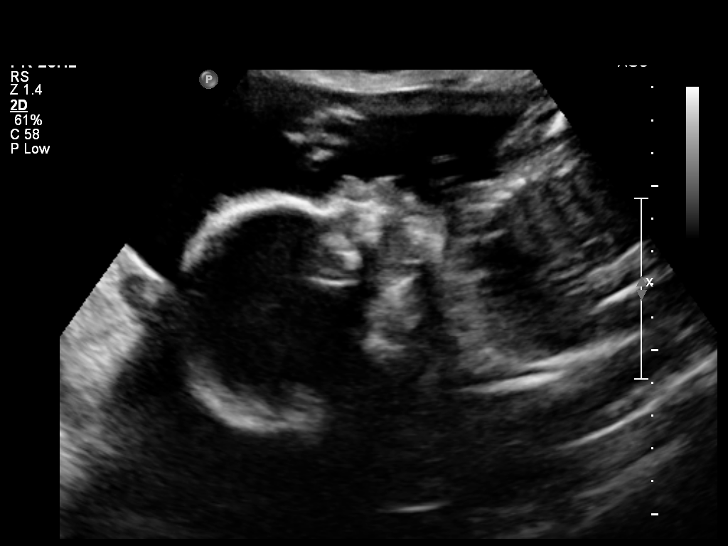

[12 of 28 positions shown; findings below may reference images not displayed]

OBSTETRICS REPORT
(Signed Final 01/01/2015 [DATE])

ERXLEBEN

Service(s) Provided

US OB COMP + 14 WK                                    76805.1
Indications

Basic anatomic survey                                 z36
No or Little Prenatal Care
26 weeks gestation of pregnancy
Fetal Evaluation

Num Of Fetuses:    1
Fetal Heart Rate:  136                          bpm
Cardiac Activity:  Observed
Presentation:      Breech
Placenta:          Posterior, above cervical
os
P. Cord            Visualized, central
Insertion:

Amniotic Fluid
AFI FV:      Subjectively within normal limits
Larg Pckt:    4.63  cm
Biometry

BPD:     61.5  mm     G. Age:  25w 0d                CI:        70.92   70 - 86
FL/HC:      20.1   18.6 -
20.4
HC:     232.7  mm     G. Age:  25w 2d        9  %    HC/AC:      1.11   1.04 -
1.22
AC:     209.4  mm     G. Age:  25w 4d       26  %    FL/BPD:     76.1   71 - 87
FL:      46.8  mm     G. Age:  25w 4d       24  %    FL/AC:      22.3   20 - 24
HUM:       43  mm     G. Age:  25w 5d       39  %
CER:     28.6  mm     G. Age:  25w 4d       44  %

Est. FW:     815  gm    1 lb 13 oz      43  %
Gestational Age

LMP:           26w 0d        Date:  07/03/14                 EDD:   04/09/15
U/S Today:     25w 3d                                        EDD:   04/13/15
Best:          26w 0d     Det. By:  LMP  (07/03/14)          EDD:   04/09/15
Anatomy

Cranium:          Appears normal         Aortic Arch:      Not well visualized
Fetal Cavum:      Appears normal         Ductal Arch:      Not well visualized
Ventricles:       Appears normal         Diaphragm:        Appears normal
Choroid Plexus:   Appears normal         Stomach:          Appears normal, left
sided
Cerebellum:       Appears normal         Abdomen:          Appears normal
Posterior Fossa:  Appears normal         Abdominal Wall:   Appears nml (cord
insert, abd wall)
Nuchal Fold:      Not applicable (>20    Cord Vessels:     Appears normal (3
wks GA)                                  vessel cord)
Face:             Orbits appear          Kidneys:          Appear normal
normal
Lips:             Not well visualized    Bladder:          Appears normal
Heart:            Not well visualized    Spine:            Appears normal
RVOT:             Not well visualized    Lower             Appears normal
Extremities:
LVOT:             Not well visualized    Upper             Appears normal
Extremities:

Other:  Fetus appears to be a female. Technically difficult due to fetal position.
Targeted Anatomy

Fetal Central Nervous System
Lat. Ventricles:  3.8                    Cisterna Magna:
Cervix Uterus Adnexa

Cervical Length:    3.74     cm

Cervix:       Normal appearance by transabdominal scan.
Uterus:       No abnormality visualized.
Left Ovary:    Size(cm) L: 2.9 x W: 2.27 x H: 1.63  Volume(cc):
Right Ovary:   Size(cm) L: 2.56 x W: 1.77 x H: 2.01  Volume(cc):

Adnexa:     No abnormality visualized. No adnexal mass visualized.
Impression

Single IUP at 26w 0d
Limited views of the fetal heart and face (lips) obtained
The remainder of the fetal anatomy appears normal
The estimated fetal weight today is at the 43rd %tile.
Posterior placenta
Normal amniotic fluid volume
Recommendations

Recommend follow-up ultrasound examination in 4 weeks to
reevaluate the fetal heart and face/ lips

## 2016-02-16 LAB — HEMOGLOBINOPATHY EVALUATION
HGB A2 QUANT: 2.2 % (ref 1.8–3.5)
Hemoglobin Other: 0 %
Hgb A: 97.8 % (ref >96.0)
Hgb F Quant: 0 % (ref <2.0)
Hgb S Quant: 0 %

## 2016-02-18 LAB — PRESCRIPTION MONITORING PROFILE (19 PANEL)
AMPHETAMINE/METH: NEGATIVE ng/mL
BARBITURATE SCREEN, URINE: NEGATIVE ng/mL
Benzodiazepine Screen, Urine: NEGATIVE ng/mL
Buprenorphine, Urine: NEGATIVE ng/mL
CARISOPRODOL, URINE: NEGATIVE ng/mL
COCAINE METABOLITES: NEGATIVE ng/mL
Creatinine, Urine: 260 mg/dL (ref >20.0)
Fentanyl, Ur: NEGATIVE ng/mL
MDMA URINE: NEGATIVE ng/mL
Meperidine, Ur: NEGATIVE ng/mL
Methadone Screen, Urine: NEGATIVE ng/mL
Methaqualone: NEGATIVE ng/mL
Nitrites, Initial: NEGATIVE ug/mL
OPIATE SCREEN, URINE: NEGATIVE ng/mL
OXYCODONE SCRN UR: NEGATIVE ng/mL
Phencyclidine, Ur: NEGATIVE ng/mL
Propoxyphene: NEGATIVE ng/mL
TAPENTADOLUR: NEGATIVE ng/mL
TRAMADOL UR: NEGATIVE ng/mL
ZOLPIDEM, URINE: NEGATIVE ng/mL
pH, Initial: 7.7 pH (ref 4.5–8.9)

## 2016-02-18 LAB — CANNABANOIDS (GC/LC/MS), URINE: THC-COOH (GC/LC/MS), ur confirm: 377 ng/mL — ABNORMAL HIGH (ref <5)

## 2016-02-22 ENCOUNTER — Encounter (HOSPITAL_COMMUNITY): Payer: Self-pay | Admitting: Obstetrics & Gynecology

## 2016-03-03 ENCOUNTER — Other Ambulatory Visit: Payer: Self-pay | Admitting: Obstetrics & Gynecology

## 2016-03-03 ENCOUNTER — Ambulatory Visit (HOSPITAL_COMMUNITY)
Admission: RE | Admit: 2016-03-03 | Discharge: 2016-03-03 | Disposition: A | Discharge status: Home / Self Care | Payer: Medicaid Other | Source: Ambulatory Visit | Attending: Obstetrics & Gynecology | Admitting: Obstetrics & Gynecology

## 2016-03-03 ENCOUNTER — Encounter (HOSPITAL_COMMUNITY): Payer: Self-pay

## 2016-03-03 DIAGNOSIS — Z36 Encounter for antenatal screening of mother: Secondary | ICD-10-CM | POA: Diagnosis present

## 2016-03-03 DIAGNOSIS — Z3A10 10 weeks gestation of pregnancy: Secondary | ICD-10-CM | POA: Insufficient documentation

## 2016-03-03 DIAGNOSIS — Z3491 Encounter for supervision of normal pregnancy, unspecified, first trimester: Secondary | ICD-10-CM

## 2016-03-03 DIAGNOSIS — Z3490 Encounter for supervision of normal pregnancy, unspecified, unspecified trimester: Secondary | ICD-10-CM

## 2016-03-10 ENCOUNTER — Ambulatory Visit (INDEPENDENT_AMBULATORY_CARE_PROVIDER_SITE_OTHER): Payer: Self-pay | Admitting: Obstetrics & Gynecology

## 2016-03-10 ENCOUNTER — Encounter: Payer: Self-pay | Admitting: Obstetrics & Gynecology

## 2016-03-10 ENCOUNTER — Encounter: Payer: Self-pay | Admitting: Clinical

## 2016-03-10 ENCOUNTER — Other Ambulatory Visit (HOSPITAL_COMMUNITY)
Admission: RE | Admit: 2016-03-10 | Discharge: 2016-03-10 | Disposition: A | Discharge status: Home / Self Care | Payer: Medicaid Other | Source: Ambulatory Visit | Attending: Obstetrics & Gynecology | Admitting: Obstetrics & Gynecology

## 2016-03-10 VITALS — BP 133/69 | HR 90 | Wt 200.3 lb

## 2016-03-10 DIAGNOSIS — Z3689 Encounter for other specified antenatal screening: Secondary | ICD-10-CM

## 2016-03-10 DIAGNOSIS — Z113 Encounter for screening for infections with a predominantly sexual mode of transmission: Secondary | ICD-10-CM | POA: Diagnosis present

## 2016-03-10 DIAGNOSIS — Z124 Encounter for screening for malignant neoplasm of cervix: Secondary | ICD-10-CM

## 2016-03-10 DIAGNOSIS — Z01419 Encounter for gynecological examination (general) (routine) without abnormal findings: Secondary | ICD-10-CM | POA: Diagnosis not present

## 2016-03-10 DIAGNOSIS — Z3481 Encounter for supervision of other normal pregnancy, first trimester: Secondary | ICD-10-CM

## 2016-03-10 DIAGNOSIS — Z3492 Encounter for supervision of normal pregnancy, unspecified, second trimester: Secondary | ICD-10-CM

## 2016-03-10 DIAGNOSIS — F1291 Cannabis use, unspecified, in remission: Secondary | ICD-10-CM

## 2016-03-10 DIAGNOSIS — Z87898 Personal history of other specified conditions: Secondary | ICD-10-CM

## 2016-03-10 LAB — POCT URINALYSIS DIP (DEVICE)
GLUCOSE, UA: NEGATIVE mg/dL
HGB URINE DIPSTICK: NEGATIVE
KETONES UR: 40 mg/dL — AB
LEUKOCYTES UA: NEGATIVE
Nitrite: NEGATIVE
PROTEIN: NEGATIVE mg/dL
Urobilinogen, UA: 1 mg/dL (ref 0.0–1.0)
pH: 6 (ref 5.0–8.0)

## 2016-03-10 MED ORDER — PRENATAL VITAMINS 0.8 MG PO TABS: 1.0000 | ORAL_TABLET | Freq: Every day | ORAL | Status: DC

## 2016-03-10 NOTE — Progress Notes (Signed)
   Subjective:    Lavinia SharpsCourtney M Louth is a [id number] 6890w2d being seen today for her first obstetrical visit.  Her obstetrical history is significant for grand multipara. Patient does not intend to breast feed. Pregnancy history fully reviewed.  Patient reports nausea and vomiting.  Filed Vitals:   03/10/16 0816  BP: 133/69  Pulse: 90  Weight: 200 lb 4.8 oz (90.855 kg)    HISTORY: OB History  Gravida Para Term Preterm AB SAB TAB Ectopic Multiple Living  6 5 5  0 0 0 0 0 0 5    # Outcome Date GA Lbr Len/2nd Weight Sex Delivery Anes PTL Lv  6 Current           5 Term 04/14/15 6697w5d 06:39 / 00:02 7 lb 9.5 oz (3.445 kg) F Vag-Spont EPI  Y  4 Term 05/21/14 7652w0d 13:58 / 00:07 7 lb 13 oz (3.544 kg) M Vag-Spont EPI  Y  3 Term 03/12/13 6452w0d  6 lb 9 oz (2.977 kg) F Vag-Spont EPI  Y     Comments: baby came out blue, "was stuck for a while, low oxygen to brain"   2 Term 03/08/11 2265w0d  5 lb 4 oz (2.381 kg) F Vag-Spont EPI  Y  1 Term 04/07/10 8542w0d  6 lb 3 oz (2.807 kg) F Vag-Spont EPI  Y     Past Medical History  Diagnosis Date  . Asthma   . ADHD (attention deficit hyperactivity disorder)   . Bipolar 1 disorder (HCC)   . PTSD (post-traumatic stress disorder)    Past Surgical History  Procedure Laterality Date  . Abdominal hernia repair    . Hernia repair     Family History  Problem Relation Age of Onset  . Diabetes Mother   . Hypertension Mother   . Diabetes Maternal Grandmother   . Hypertension Maternal Grandmother   . Heart disease Maternal Grandmother   . Cystic fibrosis Daughter      Exam    Uterus:  Fundal Height: 10 cm  Pelvic Exam:    Perineum: No Hemorrhoids   Vulva: normal   Vagina:  normal mucosa   pH:     Cervix: no lesions   Adnexa: normal adnexa   Bony Pelvis: average  System: Breast:  normal appearance, no masses or tenderness   Skin: normal coloration and turgor, no rashes    Neurologic: oriented, normal mood   Extremities: normal strength, tone,  and muscle mass   HEENT extra ocular movement intact   Mouth/Teeth dental hygiene good   Neck supple   Cardiovascular: regular rate and rhythm   Respiratory:  appears well, vitals normal, no respiratory distress, acyanotic, normal RR   Abdomen: soft, non-tender; bowel sounds normal; no masses,  no organomegaly   Urinary: urethral meatus normal      Assessment:    Pregnancy: [id number] Patient Active Problem List   Diagnosis Date Noted  . Supervision of normal subsequent pregnancy 04/01/2015  . Rh negative state in antepartum period 01/31/2014  . Mild intermittent asthma 01/30/2014  . Tobacco abuse 01/30/2014        Plan:     Initial labs drawn. Prenatal vitamins. Problem list reviewed and updated. Genetic Screening discussed First Screen: ordered.  Ultrasound discussed; fetal survey: 18+weeks.  Follow up in 4 weeks. 50% of 30 min visit spent on counseling and coordination of care.  1 hr GTT, discussed BCM including BTL   Alleya Demeter 03/10/2016

## 2016-03-10 NOTE — Progress Notes (Signed)
Nuchal scheduled 07/05 @ 1pm. U/S for anatomy scheduled 08/15 @ 1pm.

## 2016-03-10 NOTE — Progress Notes (Signed)
New ob packet given  Early glucola given due to BMI >30 

## 2016-03-10 NOTE — BH Specialist Note (Signed)
Brief introduction to [facility] services at [facility] 

## 2016-03-10 NOTE — Patient Instructions (Signed)
First Trimester of Pregnancy The first trimester of pregnancy is from week 1 until the end of week 12 (months 1 through 3). A week after a sperm fertilizes an egg, the egg will implant on the wall of the uterus. This embryo will begin to develop into a baby. Genes from you and your partner are forming the baby. The female genes determine whether the baby is a boy or a girl. At 6-8 weeks, the eyes and face are formed, and the heartbeat can be seen on ultrasound. At the end of 12 weeks, all the baby's organs are formed.  Now that you are pregnant, you will want to do everything you can to have a healthy baby. Two of the most important things are to get good prenatal care and to follow your health care provider's instructions. Prenatal care is all the medical care you receive before the baby's birth. This care will help prevent, find, and treat any problems during the pregnancy and childbirth. BODY CHANGES Your body goes through many changes during pregnancy. The changes vary from woman to woman.   You may gain or lose a couple of pounds at first.  You may feel sick to your stomach (nauseous) and throw up (vomit). If the vomiting is uncontrollable, call your health care provider.  You may tire easily.  You may develop headaches that can be relieved by medicines approved by your health care provider.  You may urinate more often. Painful urination may mean you have a bladder infection.  You may develop heartburn as a result of your pregnancy.  You may develop constipation because certain hormones are causing the muscles that push waste through your intestines to slow down.  You may develop hemorrhoids or swollen, bulging veins (varicose veins).  Your breasts may begin to grow larger and become tender. Your nipples may stick out more, and the tissue that surrounds them (areola) may become darker.  Your gums may bleed and may be sensitive to brushing and flossing.  Dark spots or blotches (chloasma,  mask of pregnancy) may develop on your face. This will likely fade after the baby is born.  Your menstrual periods will stop.  You may have a loss of appetite.  You may develop cravings for certain kinds of food.  You may have changes in your emotions from day to day, such as being excited to be pregnant or being concerned that something may go wrong with the pregnancy and baby.  You may have more vivid and strange dreams.  You may have changes in your hair. These can include thickening of your hair, rapid growth, and changes in texture. Some women also have hair loss during or after pregnancy, or hair that feels dry or thin. Your hair will most likely return to normal after your baby is born. WHAT TO EXPECT AT YOUR PRENATAL VISITS During a routine prenatal visit:  You will be weighed to make sure you and the baby are growing normally.  Your blood pressure will be taken.  Your abdomen will be measured to track your baby's growth.  The fetal heartbeat will be listened to starting around week 10 or 12 of your pregnancy.  Test results from any previous visits will be discussed. Your health care provider may ask you:  How you are feeling.  If you are feeling the baby move.  If you have had any abnormal symptoms, such as leaking fluid, bleeding, severe headaches, or abdominal cramping.  If you are using any tobacco products,   including cigarettes, chewing tobacco, and electronic cigarettes.  If you have any questions. Other tests that may be performed during your first trimester include:  Blood tests to find your blood type and to check for the presence of any previous infections. They will also be used to check for low iron levels (anemia) and Rh antibodies. Later in the pregnancy, blood tests for diabetes will be done along with other tests if problems develop.  Urine tests to check for infections, diabetes, or protein in the urine.  An ultrasound to confirm the proper growth  and development of the baby.  An amniocentesis to check for possible genetic problems.  Fetal screens for spina bifida and Down syndrome.  You may need other tests to make sure you and the baby are doing well.  HIV (human immunodeficiency virus) testing. Routine prenatal testing includes screening for HIV, unless you choose not to have this test. HOME CARE INSTRUCTIONS  Medicines  Follow your health care provider's instructions regarding medicine use. Specific medicines may be either safe or unsafe to take during pregnancy.  Take your prenatal vitamins as directed.  If you develop constipation, try taking a stool softener if your health care provider approves. Diet  Eat regular, well-balanced meals. Choose a variety of foods, such as meat or vegetable-based protein, fish, milk and low-fat dairy products, vegetables, fruits, and whole grain breads and cereals. Your health care provider will help you determine the amount of weight gain that is right for you.  Avoid raw meat and uncooked cheese. These carry germs that can cause birth defects in the baby.  Eating four or five small meals rather than three large meals a day may help relieve nausea and vomiting. If you start to feel nauseous, eating a few soda crackers can be helpful. Drinking liquids between meals instead of during meals also seems to help nausea and vomiting.  If you develop constipation, eat more high-fiber foods, such as fresh vegetables or fruit and whole grains. Drink enough fluids to keep your urine clear or pale yellow. Activity and Exercise  Exercise only as directed by your health care provider. Exercising will help you:  Control your weight.  Stay in shape.  Be prepared for labor and delivery.  Experiencing pain or cramping in the lower abdomen or low back is a good sign that you should stop exercising. Check with your health care provider before continuing normal exercises.  Try to avoid standing for long  periods of time. Move your legs often if you must stand in one place for a long time.  Avoid heavy lifting.  Wear low-heeled shoes, and practice good posture.  You may continue to have sex unless your health care provider directs you otherwise. Relief of Pain or Discomfort  Wear a good support bra for breast tenderness.   Take warm sitz baths to soothe any pain or discomfort caused by hemorrhoids. Use hemorrhoid cream if your health care provider approves.   Rest with your legs elevated if you have leg cramps or low back pain.  If you develop varicose veins in your legs, wear support hose. Elevate your feet for 15 minutes, 3-4 times a day. Limit salt in your diet. Prenatal Care  Schedule your prenatal visits by the twelfth week of pregnancy. They are usually scheduled monthly at first, then more often in the last 2 months before delivery.  Write down your questions. Take them to your prenatal visits.  Keep all your prenatal visits as directed by your   health care provider. Safety  Wear your seat belt at all times when driving.  Make a list of emergency phone numbers, including numbers for family, friends, the hospital, and police and fire departments. General Tips  Ask your health care provider for a referral to a local prenatal education class. Begin classes no later than at the beginning of month 6 of your pregnancy.  Ask for help if you have counseling or nutritional needs during pregnancy. Your health care provider can offer advice or refer you to specialists for help with various needs.  Do not use hot tubs, steam rooms, or saunas.  Do not douche or use tampons or scented sanitary pads.  Do not cross your legs for long periods of time.  Avoid cat litter boxes and soil used by cats. These carry germs that can cause birth defects in the baby and possibly loss of the fetus by miscarriage or stillbirth.  Avoid all smoking, herbs, alcohol, and medicines not prescribed by  your health care provider. Chemicals in these affect the formation and growth of the baby.  Do not use any tobacco products, including cigarettes, chewing tobacco, and electronic cigarettes. If you need help quitting, ask your health care provider. You may receive counseling support and other resources to help you quit.  Schedule a dentist appointment. At home, brush your teeth with a soft toothbrush and be gentle when you floss. SEEK MEDICAL CARE IF:   You have dizziness.  You have mild pelvic cramps, pelvic pressure, or nagging pain in the abdominal area.  You have persistent nausea, vomiting, or diarrhea.  You have a bad smelling vaginal discharge.  You have pain with urination.  You notice increased swelling in your face, hands, legs, or ankles. SEEK IMMEDIATE MEDICAL CARE IF:   You have a fever.  You are leaking fluid from your vagina.  You have spotting or bleeding from your vagina.  You have severe abdominal cramping or pain.  You have rapid weight gain or loss.  You vomit blood or material that looks like coffee grounds.  You are exposed to German measles and have never had them.  You are exposed to fifth disease or chickenpox.  You develop a severe headache.  You have shortness of breath.  You have any kind of trauma, such as from a fall or a car accident.   This information is not intended to replace advice given to you by your health care provider. Make sure you discuss any questions you have with your health care provider.   Document Released: 08/30/2001 Document Revised: 09/26/2014 Document Reviewed: 07/16/2013 Elsevier Interactive Patient Education 2016 [employer]  

## 2016-03-11 LAB — CYTOLOGY - PAP

## 2016-03-11 LAB — GLUCOSE TOLERANCE, 1 HOUR (50G) W/O FASTING: GLUCOSE, 1 HR, GESTATIONAL: 132 mg/dL (ref <140)

## 2016-03-14 ENCOUNTER — Encounter (HOSPITAL_COMMUNITY): Payer: Self-pay | Admitting: Obstetrics & Gynecology

## 2016-03-16 LAB — CP5000051 PDM PROFILE
Amphetamines: NEGATIVE ng/mL (ref <500)
BUPRENORPHINE: NEGATIVE ng/mL (ref <5)
Barbiturates: NEGATIVE ng/mL (ref <300)
Benzodiazepines: NEGATIVE ng/mL (ref <100)
Cocaine Metabolite: NEGATIVE ng/mL (ref <150)
DESMETHYLTRAMADOL: NEGATIVE ng/mL (ref <100)
Fentanyl: NEGATIVE ng/mL (ref <0.5)
MDMA: NEGATIVE ng/mL (ref <500)
MEPROBAMATE: NEGATIVE ng/mL (ref <1000)
Marijuana Metabolite: 154 ng/mL — ABNORMAL HIGH (ref <5)
Marijuana Metabolite: POSITIVE ng/mL — AB (ref <20)
Meperidine: NEGATIVE ng/mL (ref <100)
Methadone Metabolite: NEGATIVE ng/mL (ref <100)
NORFENTANYL: NEGATIVE ng/mL (ref <0.5)
NORMEPERIDINE: NEGATIVE ng/mL (ref <100)
Nortapentadol: NEGATIVE ng/mL (ref <50)
OPIATES: NEGATIVE ng/mL (ref <100)
OXYCODONE: NEGATIVE ng/mL (ref <100)
Phencyclidine: NEGATIVE ng/mL (ref <25)
Propoxyphene: NEGATIVE ng/mL (ref <300)
Tapentadol: NEGATIVE ng/mL (ref <50)
Tramadol: NEGATIVE ng/mL (ref <100)
ZOLPIDEM METABOLITE: NEGATIVE ng/mL (ref <5)
ZOLPIDEM: NEGATIVE ng/mL (ref <5)

## 2016-03-21 DIAGNOSIS — Z87898 Personal history of other specified conditions: Secondary | ICD-10-CM | POA: Insufficient documentation

## 2016-03-21 DIAGNOSIS — F1291 Cannabis use, unspecified, in remission: Secondary | ICD-10-CM | POA: Insufficient documentation

## 2016-03-23 ENCOUNTER — Ambulatory Visit (HOSPITAL_COMMUNITY)
Admission: RE | Admit: 2016-03-23 | Discharge: 2016-03-23 | Disposition: A | Discharge status: Home / Self Care | Payer: Medicaid Other | Source: Ambulatory Visit | Attending: Obstetrics & Gynecology | Admitting: Obstetrics & Gynecology

## 2016-03-23 ENCOUNTER — Encounter (HOSPITAL_COMMUNITY): Payer: Self-pay

## 2016-03-23 VITALS — BP 117/62 | HR 105 | Wt 197.6 lb

## 2016-03-23 DIAGNOSIS — Z3A13 13 weeks gestation of pregnancy: Secondary | ICD-10-CM | POA: Diagnosis not present

## 2016-03-23 DIAGNOSIS — Z3482 Encounter for supervision of other normal pregnancy, second trimester: Secondary | ICD-10-CM

## 2016-03-23 DIAGNOSIS — Z3481 Encounter for supervision of other normal pregnancy, first trimester: Secondary | ICD-10-CM | POA: Diagnosis not present

## 2016-03-23 DIAGNOSIS — Z36 Encounter for antenatal screening of mother: Secondary | ICD-10-CM | POA: Insufficient documentation

## 2016-03-23 DIAGNOSIS — Z3689 Encounter for other specified antenatal screening: Secondary | ICD-10-CM

## 2016-03-23 DIAGNOSIS — Z3492 Encounter for supervision of normal pregnancy, unspecified, second trimester: Secondary | ICD-10-CM

## 2016-03-23 NOTE — Progress Notes (Signed)
Home Medicaid Form Completed  

## 2016-03-31 ENCOUNTER — Other Ambulatory Visit (HOSPITAL_COMMUNITY): Payer: Self-pay

## 2016-04-07 ENCOUNTER — Encounter: Payer: Self-pay | Admitting: Family Medicine

## 2016-04-07 ENCOUNTER — Encounter: Payer: Medicaid Other | Admitting: Obstetrics and Gynecology

## 2016-04-20 ENCOUNTER — Emergency Department (HOSPITAL_COMMUNITY)
Admission: EM | Admit: 2016-04-20 | Discharge: 2016-04-21 | Disposition: A | Discharge status: Home / Self Care | Payer: Medicaid Other | Attending: Emergency Medicine | Admitting: Emergency Medicine

## 2016-04-20 ENCOUNTER — Encounter (HOSPITAL_COMMUNITY): Payer: Self-pay | Admitting: Emergency Medicine

## 2016-04-20 ENCOUNTER — Inpatient Hospital Stay (EMERGENCY_DEPARTMENT_HOSPITAL)
Admission: AD | Admit: 2016-04-20 | Discharge: 2016-04-20 | Disposition: A | Discharge status: Home / Self Care | Payer: Medicaid Other | Source: Ambulatory Visit | Attending: Obstetrics and Gynecology | Admitting: Obstetrics and Gynecology

## 2016-04-20 DIAGNOSIS — J028 Acute pharyngitis due to other specified organisms: Secondary | ICD-10-CM

## 2016-04-20 DIAGNOSIS — J069 Acute upper respiratory infection, unspecified: Secondary | ICD-10-CM

## 2016-04-20 DIAGNOSIS — F17210 Nicotine dependence, cigarettes, uncomplicated: Secondary | ICD-10-CM

## 2016-04-20 DIAGNOSIS — H9201 Otalgia, right ear: Secondary | ICD-10-CM | POA: Insufficient documentation

## 2016-04-20 DIAGNOSIS — Z79899 Other long term (current) drug therapy: Secondary | ICD-10-CM | POA: Diagnosis not present

## 2016-04-20 DIAGNOSIS — O99332 Smoking (tobacco) complicating pregnancy, second trimester: Secondary | ICD-10-CM | POA: Insufficient documentation

## 2016-04-20 DIAGNOSIS — J45909 Unspecified asthma, uncomplicated: Secondary | ICD-10-CM | POA: Insufficient documentation

## 2016-04-20 DIAGNOSIS — Z3A17 17 weeks gestation of pregnancy: Secondary | ICD-10-CM | POA: Insufficient documentation

## 2016-04-20 DIAGNOSIS — O99512 Diseases of the respiratory system complicating pregnancy, second trimester: Secondary | ICD-10-CM | POA: Insufficient documentation

## 2016-04-20 DIAGNOSIS — J020 Streptococcal pharyngitis: Secondary | ICD-10-CM | POA: Insufficient documentation

## 2016-04-20 DIAGNOSIS — J029 Acute pharyngitis, unspecified: Secondary | ICD-10-CM | POA: Diagnosis present

## 2016-04-20 DIAGNOSIS — B9789 Other viral agents as the cause of diseases classified elsewhere: Secondary | ICD-10-CM

## 2016-04-20 LAB — RAPID STREP SCREEN (MED CTR MEBANE ONLY)
STREPTOCOCCUS, GROUP A SCREEN (DIRECT): POSITIVE — AB
Streptococcus, Group A Screen (Direct): NEGATIVE

## 2016-04-20 MED ORDER — HYDROCODONE-ACETAMINOPHEN 5-325 MG PO TABS
2.0000 | ORAL_TABLET | Freq: Once | ORAL | Status: AC
  Administered 2016-04-20: 2 via ORAL
  Filled 2016-04-20: qty 2

## 2016-04-20 MED ORDER — HYDROCODONE-ACETAMINOPHEN 5-325 MG PO TABS: 2.0000 | ORAL_TABLET | ORAL | 0 refills | Status: DC | PRN

## 2016-04-20 MED ORDER — PENICILLIN G BENZATHINE 1200000 UNIT/2ML IM SUSP
1.2000 10*6.[IU] | Freq: Once | INTRAMUSCULAR | Status: AC
  Administered 2016-04-20: 1.2 10*6.[IU] via INTRAMUSCULAR
  Filled 2016-04-20: qty 2

## 2016-04-20 NOTE — MAU Note (Signed)
Pt is complaining of swollen tonsils and ear pain that started 1 week ago. States that it is hard to swallow, eat, etc. Noticed small streaks of blood in spit today. Denies being sick or having a fever. Denies abdominal pain and vag bleeding.

## 2016-04-20 NOTE — ED Triage Notes (Signed)
Patient reports sore throat with swelling onset last week , pt. added right earache today , denies fever or chills. Airway intact / respirations unlabored .

## 2016-04-20 NOTE — MAU Note (Signed)
Pt left before receiving discharge instructions.

## 2016-04-20 NOTE — MAU Provider Note (Signed)
Ear, throat pain, no cough, x1 day, no fever,, strep test pending  Chief Complaint: No chief complaint on file.   First Provider Initiated Contact with Patient 04/20/16 1921      SUBJECTIVE HPI: Mary Mitchell is a 19 y.o. Z6X0960 at [redacted]w[redacted]d by LMP who presents to maternity admissions reporting onset of throat pain 1 week ago that resolved then returned today, then right ear pain that started this morning.  She has not tried anything for pain. She reports "I have been dealing with tonsils my whole life but it has never hurt like this". She denies cough or other respiratory symptoms or fever/chills.  She denies sick contacts.    She denies gyn concerns, urinary symptoms, h/a, dizziness, n/v, or fever/chills.     HPI  Past Medical History:  Diagnosis Date  . ADHD (attention deficit hyperactivity disorder)   . Asthma   . Bipolar 1 disorder (HCC)   . PTSD (post-traumatic stress disorder)    Past Surgical History:  Procedure Laterality Date  . ABDOMINAL HERNIA REPAIR    . HERNIA REPAIR     Social History   Social History  . Marital status: Single    Spouse name: N/A  . Number of children: N/A  . Years of education: N/A   Occupational History  . Not on file.   Social History Main Topics  . Smoking status: Light Tobacco Smoker    Packs/day: 0.00    Types: Cigarettes  . Smokeless tobacco: Never Used  . Alcohol use No  . Drug use: No  . Sexual activity: Yes   Other Topics Concern  . Not on file   Social History Narrative  . No narrative on file   No current facility-administered medications on file prior to encounter.    Current Outpatient Prescriptions on File Prior to Encounter  Medication Sig Dispense Refill  . Prenatal Multivit-Min-Fe-FA (PRENATAL VITAMINS) 0.8 MG tablet Take 1 tablet by mouth daily. (Patient not taking: Reported on 03/23/2016) 30 tablet 12  . PROAIR HFA 108 (90 BASE) MCG/ACT inhaler Inhale 2 puffs into the lungs every 6 (six) hours as needed.  Reported on 03/23/2016  0   No Known Allergies  ROS:  Review of Systems  Constitutional: Negative for chills, fatigue and fever.  HENT: Positive for ear pain, sore throat and trouble swallowing. Negative for postnasal drip, rhinorrhea, sinus pressure, sneezing and tinnitus.   Respiratory: Negative for shortness of breath.   Cardiovascular: Negative for chest pain.  Genitourinary: Negative for difficulty urinating, dysuria, flank pain, pelvic pain, vaginal bleeding, vaginal discharge and vaginal pain.  Neurological: Negative for dizziness and headaches.  Psychiatric/Behavioral: Negative.      I have reviewed patient's Past Medical Hx, Surgical Hx, Family Hx, Social Hx, medications and allergies.   Physical Exam  Patient Vitals for the past 24 hrs:  BP Temp Temp src Pulse Resp SpO2 Height Weight  04/20/16 1914 134/72 98.5 F (36.9 C) Oral 99 16 100 %  (1.575 m) 200 lb (90.7 kg)  VS reviewed, nursing note reviewed,  Constitutional: well developed, well nourished, no distress HEENT: normocephalic, right ear with normal tempanic membrane, mild erythema, no edema, throat with edema, greater on left than on right, mild erythema, no white patches CV: normal rate Pulm/chest wall: normal effort Abdomen: soft Neuro: alert and oriented x 3 Skin: warm, dry Psych: affect normal   MAU Management/MDM:  HEENT exam c/w viral infection.  Rapid strep swab collected.  Conservative management with salt  water gargles, Tylenol, rest.  Recommend primary care if symptoms persist or worsen.  Pt stable at time of discharge.  ASSESSMENT 1. Acute upper respiratory infection   2. Sore throat (viral)   3. Acute pain of right ear     PLAN Discharge home   Medication List    TAKE these medications   Prenatal Vitamins 0.8 MG tablet Take 1 tablet by mouth daily.   PROAIR HFA 108 (90 Base) MCG/ACT inhaler Generic drug:  albuterol Inhale 2 puffs into the lungs every 6 (six) hours as needed.  Reported on 03/23/2016      Follow-up Information    Primary care provider .   Why:  Please see an Urgent Care or other primary care provider if symptoms persist or fever develops.          Sharen Counter Certified Nurse-Midwife 04/20/2016  7:42 PM

## 2016-04-20 NOTE — Discharge Instructions (Signed)
Upper Respiratory Infection, Adult Most upper respiratory infections (URIs) are a viral infection of the air passages leading to the lungs. A URI affects the nose, throat, and upper air passages. The most common type of URI is nasopharyngitis and is typically referred to as "the common cold." URIs run their course and usually go away on their own. Most of the time, a URI does not require medical attention, but sometimes a bacterial infection in the upper airways can follow a viral infection. This is called a secondary infection. Sinus and middle ear infections are common types of secondary upper respiratory infections. Bacterial pneumonia can also complicate a URI. A URI can worsen asthma and chronic obstructive pulmonary disease (COPD). Sometimes, these complications can require emergency medical care and may be life threatening.  CAUSES Almost all URIs are caused by viruses. A virus is a type of germ and can spread from one person to another.  RISKS FACTORS You may be at risk for a URI if:   You smoke.   You have chronic heart or lung disease.  You have a weakened defense (immune) system.   You are very young or very old.   You have nasal allergies or asthma.  You work in crowded or poorly ventilated areas.  You work in health care facilities or schools. SIGNS AND SYMPTOMS  Symptoms typically develop 2-3 days after you come in contact with a cold virus. Most viral URIs last 7-10 days. However, viral URIs from the influenza virus (flu virus) can last 14-18 days and are typically more severe. Symptoms may include:   Runny or stuffy (congested) nose.   Sneezing.   Cough.   Sore throat.   Headache.   Fatigue.   Fever.   Loss of appetite.   Pain in your forehead, behind your eyes, and over your cheekbones (sinus pain).  Muscle aches.  DIAGNOSIS  Your health care provider may diagnose a URI by:  Physical exam.  Tests to check that your symptoms are not due to  another condition such as:  Strep throat.  Sinusitis.  Pneumonia.  Asthma. TREATMENT  A URI goes away on its own with time. It cannot be cured with medicines, but medicines may be prescribed or recommended to relieve symptoms. Medicines may help:  Reduce your fever.  Reduce your cough.  Relieve nasal congestion. HOME CARE INSTRUCTIONS   Take medicines only as directed by your health care provider.   Gargle warm saltwater or take cough drops to comfort your throat as directed by your health care provider.  Use a warm mist humidifier or inhale steam from a shower to increase air moisture. This may make it easier to breathe.  Drink enough fluid to keep your urine clear or pale yellow.   Eat soups and other clear broths and maintain good nutrition.   Rest as needed.   Return to work when your temperature has returned to normal or as your health care provider advises. You may need to stay home longer to avoid infecting others. You can also use a face mask and careful hand washing to prevent spread of the virus.  Increase the usage of your inhaler if you have asthma.   Do not use any tobacco products, including cigarettes, chewing tobacco, or electronic cigarettes. If you need help quitting, ask your health care provider. PREVENTION  The best way to protect yourself from getting a cold is to practice good hygiene.   Avoid oral or hand contact with people with cold  symptoms.   Wash your hands often if contact occurs.  There is no clear evidence that vitamin C, vitamin E, echinacea, or exercise reduces the chance of developing a cold. However, it is always recommended to get plenty of rest, exercise, and practice good nutrition.  SEEK MEDICAL CARE IF:   You are getting worse rather than better.   Your symptoms are not controlled by medicine.   You have chills.  You have worsening shortness of breath.  You have brown or red mucus.  You have yellow or brown nasal  discharge.  You have pain in your face, especially when you bend forward.  You have a fever.  You have swollen neck glands.  You have pain while swallowing.  You have white areas in the back of your throat. SEEK IMMEDIATE MEDICAL CARE IF:   You have severe or persistent:  Headache.  Ear pain.  Sinus pain.  Chest pain.  You have chronic lung disease and any of the following:  Wheezing.  Prolonged cough.  Coughing up blood.  A change in your usual mucus.  You have a stiff neck.  You have changes in your:  Vision.  Hearing.  Thinking.  Mood. MAKE SURE YOU:   Understand these instructions.  Will watch your condition.  Will get help right away if you are not doing well or get worse.   This information is not intended to replace advice given to you by your health care provider. Make sure you discuss any questions you have with your health care provider.   Document Released: 03/01/2001 Document Revised: 01/20/2015 Document Reviewed: 12/11/2013 Elsevier Interactive Patient Education 2016 [employer]   Otitis Externa Otitis externa is a bacterial or fungal infection of the outer ear canal. This is the area from the eardrum to the outside of the ear. Otitis externa is sometimes called "swimmer's ear." CAUSES  Possible causes of infection include:  Swimming in dirty water.  Moisture remaining in the ear after swimming or bathing.  Mild injury (trauma) to the ear.  Objects stuck in the ear (foreign body).  Cuts or scrapes (abrasions) on the outside of the ear. SIGNS AND SYMPTOMS  The first symptom of infection is often itching in the ear canal. Later signs and symptoms may include swelling and redness of the ear canal, ear pain, and yellowish-white fluid (pus) coming from the ear. The ear pain may be worse when pulling on the earlobe. DIAGNOSIS  Your health care provider will perform a physical exam. A sample of fluid may be taken from the ear and  examined for bacteria or fungi. TREATMENT  Antibiotic ear drops are often given for 10 to 14 days. Treatment may also include pain medicine or corticosteroids to reduce itching and swelling. HOME CARE INSTRUCTIONS   Apply antibiotic ear drops to the ear canal as prescribed by your health care provider.  Take medicines only as directed by your health care provider.  If you have diabetes, follow any additional treatment instructions from your health care provider.  Keep all follow-up visits as directed by your health care provider. PREVENTION   Keep your ear dry. Use the corner of a towel to absorb water out of the ear canal after swimming or bathing.  Avoid scratching or putting objects inside your ear. This can damage the ear canal or remove the protective wax that lines the canal. This makes it easier for bacteria and fungi to grow.  Avoid swimming in lakes, polluted water, or poorly chlorinated  pools.  You may use ear drops made of rubbing alcohol and vinegar after swimming. Combine equal parts of white vinegar and alcohol in a bottle. Put 3 or 4 drops into each ear after swimming. SEEK MEDICAL CARE IF:   You have a fever.  Your ear is still red, swollen, painful, or draining pus after 3 days.  Your redness, swelling, or pain gets worse.  You have a severe headache.  You have redness, swelling, pain, or tenderness in the area behind your ear. MAKE SURE YOU:   Understand these instructions.  Will watch your condition.  Will get help right away if you are not doing well or get worse.   This information is not intended to replace advice given to you by your health care provider. Make sure you discuss any questions you have with your health care provider.   Document Released: 09/05/2005 Document Revised: 09/26/2014 Document Reviewed: 09/22/2011 Elsevier Interactive Patient Education [employer]

## 2016-04-20 NOTE — ED Provider Notes (Signed)
[facility]-EMERGENCY DEPT Provider Note   CSN: [id number] Arrival date & time: 04/20/16  2151  First Provider Contact:   First MD Initiated Contact with Patient 04/20/16 2329     By signing my name below, I, Mary Mitchell, attest that this documentation has been prepared under the direction and in the presence of Mary Schaumann, PA-C. Electronically Signed: Angelene Mitchell, ED Scribe. 04/20/16. 11:36 PM.    History   Chief Complaint Chief Complaint  Patient presents with  . Sore Throat  . Otalgia    HPI Comments: Mary Mitchell is a 20 y.o. female who presents to the Emergency Department complaining of gradually worsening moderate sore throat with swelling onset 1 week ago. She reports associated pain with swallowing. No alleviating factors noted. She has not tried any medications PTA.  Pt denies any sick contacts with similar symptoms. She reports NKDA. No fever or chills.    The history is provided by the patient. No language interpreter was used.    Past Medical History:  Diagnosis Date  . ADHD (attention deficit hyperactivity disorder)   . Asthma   . Bipolar 1 disorder (HCC)   . PTSD (post-traumatic stress disorder)     Patient Active Problem List   Diagnosis Date Noted  . History of marijuana use 03/21/2016  . Supervision of normal subsequent pregnancy 04/01/2015  . Rh negative state in antepartum period 01/31/2014  . Mild intermittent asthma 01/30/2014  . Tobacco abuse 01/30/2014    Past Surgical History:  Procedure Laterality Date  . ABDOMINAL HERNIA REPAIR    . HERNIA REPAIR      OB History    Gravida Para Term Preterm AB Living   0 0 5   SAB TAB Ectopic Multiple Live Births   0 0 0 0 5       Home Medications    Prior to Admission medications   Medication Sig Start Date End Date Taking? Authorizing Provider  Prenatal Multivit-Min-Fe-FA (PRENATAL VITAMINS) 0.8 MG tablet Take 1 tablet by mouth daily. Patient not taking: Reported on  03/23/2016 03/10/16   Adam Phenix, MD  PROAIR HFA 108 (90 BASE) MCG/ACT inhaler Inhale 2 puffs into the lungs every 6 (six) hours as needed. Reported on 03/23/2016 04/01/15   Historical Provider, MD    Family History Family History  Problem Relation Age of Onset  . Diabetes Mother   . Hypertension Mother   . Diabetes Maternal Grandmother   . Hypertension Maternal Grandmother   . Heart disease Maternal Grandmother   . Cystic fibrosis Daughter     Social History Social History  Substance Use Topics  . Smoking status: Light Tobacco Smoker    Packs/day: 0.00    Types: Cigarettes  . Smokeless tobacco: Never Used  . Alcohol use No     Allergies   Review of patient's allergies indicates no known allergies.   Review of Systems Review of Systems  Constitutional: Negative for chills and fever.  HENT: Positive for sore throat.      Physical Exam Updated Vital Signs BP 136/77 (BP Location: Right Arm)   Pulse 110   Temp 98.7 F (37.1 C) (Oral)   Ht  (1.575 m)   Wt 199 lb 7 oz (90.5 kg)   LMP 10/21/2015 (Exact Date)   SpO2 100%   BMI 36.48 kg/m   Physical Exam  HENT:  Mouth/Throat: Uvula is midline. Oropharyngeal exudate, posterior oropharyngeal edema and posterior oropharyngeal erythema present.  Palate is  without edema      ED Treatments / Results  DIAGNOSTIC STUDIES: Oxygen Saturation is 100% on RA, normal by my interpretation.    COORDINATION OF CARE: 11:33 PM- Pt advised of plan for treatment and pt agrees. Pt informed of her strep results. She will Bicillin and Hydrocodone.     Labs (all labs ordered are listed, but only abnormal results are displayed) Labs Reviewed  RAPID STREP SCREEN (NOT AT [facility]) - Abnormal; Notable for the following:       Result Value   Streptococcus, Group A Screen (Direct) POSITIVE (*)    All other components within normal limits   Procedures Procedures (including critical care time)  Medications Ordered in ED Medications -  No data to display   Initial Impression / Assessment and Plan / ED Course  Mary Schaumann, PA-C has reviewed the triage vital signs and the nursing notes.  Pertinent labs & imaging results that were available during my care of the patient were reviewed by me and considered in my medical decision making (see chart for details).  Clinical Course    Strep positive   Final Clinical Impressions(s) / ED Diagnoses   Final diagnoses:  Strep pharyngitis    I personally performed the services in this documentation, which was scribed in my presence.  The recorded information has been reviewed and considered.   Barnet Pall.  New Prescriptions New Prescriptions   No medications on file   Meds ordered this encounter  Medications  . penicillin g benzathine (BICILLIN LA) 1200000 UNIT/2ML injection 1.2 Million Units    Order Specific Question:   Antibiotic Indication:    Answer:   Group B Strep Prophylaxis  . HYDROcodone-acetaminophen (NORCO/VICODIN) 5-325 MG per tablet 2 tablet  . HYDROcodone-acetaminophen (NORCO/VICODIN) 5-325 MG tablet    Sig: Take 2 tablets by mouth every 4 (four) hours as needed.    Dispense:  16 tablet    Refill:  0    Order Specific Question:   Supervising Provider    Answer:   Basehor, MARK [[id number]     Lonia Skinner Powder[place], PA-C 04/21/16 0153    Geoffery Lyons, MD 04/21/16 [phone]

## 2016-04-22 ENCOUNTER — Emergency Department (HOSPITAL_COMMUNITY): Payer: Medicaid Other

## 2016-04-22 ENCOUNTER — Emergency Department (HOSPITAL_COMMUNITY)
Admission: EM | Admit: 2016-04-22 | Discharge: 2016-04-22 | Disposition: A | Discharge status: Home / Self Care | Payer: Medicaid Other | Attending: Emergency Medicine | Admitting: Emergency Medicine

## 2016-04-22 ENCOUNTER — Encounter (HOSPITAL_COMMUNITY): Payer: Self-pay | Admitting: Family Medicine

## 2016-04-22 DIAGNOSIS — O99332 Smoking (tobacco) complicating pregnancy, second trimester: Secondary | ICD-10-CM | POA: Diagnosis not present

## 2016-04-22 DIAGNOSIS — F17210 Nicotine dependence, cigarettes, uncomplicated: Secondary | ICD-10-CM | POA: Insufficient documentation

## 2016-04-22 DIAGNOSIS — Z3A17 17 weeks gestation of pregnancy: Secondary | ICD-10-CM | POA: Insufficient documentation

## 2016-04-22 DIAGNOSIS — J45909 Unspecified asthma, uncomplicated: Secondary | ICD-10-CM | POA: Diagnosis not present

## 2016-04-22 DIAGNOSIS — J020 Streptococcal pharyngitis: Secondary | ICD-10-CM | POA: Diagnosis not present

## 2016-04-22 DIAGNOSIS — Z79899 Other long term (current) drug therapy: Secondary | ICD-10-CM | POA: Insufficient documentation

## 2016-04-22 DIAGNOSIS — O99512 Diseases of the respiratory system complicating pregnancy, second trimester: Secondary | ICD-10-CM | POA: Diagnosis present

## 2016-04-22 LAB — CBC WITH DIFFERENTIAL/PLATELET
BASOS PCT: 0 %
Basophils Absolute: 0 10*3/uL (ref 0.0–0.1)
EOS ABS: 0 10*3/uL (ref 0.0–0.7)
Eosinophils Relative: 1 %
HCT: 36.3 % (ref 36.0–46.0)
Hemoglobin: 11.6 g/dL — ABNORMAL LOW (ref 12.0–15.0)
Lymphocytes Relative: 21 %
Lymphs Abs: 1.7 10*3/uL (ref 0.7–4.0)
MCH: 25.2 pg — AB (ref 26.0–34.0)
MCHC: 32 g/dL (ref 30.0–36.0)
MCV: 78.7 fL (ref 78.0–100.0)
MONO ABS: 0.4 10*3/uL (ref 0.1–1.0)
MONOS PCT: 6 %
Neutro Abs: 5.6 10*3/uL (ref 1.7–7.7)
Neutrophils Relative %: 72 %
Platelets: 249 10*3/uL (ref 150–400)
RBC: 4.61 MIL/uL (ref 3.87–5.11)
RDW: 13.7 % (ref 11.5–15.5)
WBC: 7.8 10*3/uL (ref 4.0–10.5)

## 2016-04-22 LAB — I-STAT BETA HCG BLOOD, ED (MC, WL, AP ONLY)

## 2016-04-22 LAB — COMPREHENSIVE METABOLIC PANEL
ALBUMIN: 3.1 g/dL — AB (ref 3.5–5.0)
ALT: 10 U/L — ABNORMAL LOW (ref 14–54)
ANION GAP: 8 (ref 5–15)
AST: 15 U/L (ref 15–41)
Alkaline Phosphatase: 106 U/L (ref 38–126)
BILIRUBIN TOTAL: 0.2 mg/dL — AB (ref 0.3–1.2)
BUN: <5 mg/dL — ABNORMAL LOW (ref 6–20)
CO2: 25 mmol/L (ref 22–32)
Calcium: 9 mg/dL (ref 8.9–10.3)
Chloride: 102 mmol/L (ref 101–111)
Creatinine, Ser: 0.45 mg/dL (ref 0.44–1.00)
GFR calc non Af Amer: >60 mL/min (ref >60)
GLUCOSE: 81 mg/dL (ref 65–99)
POTASSIUM: 3.3 mmol/L — AB (ref 3.5–5.1)
SODIUM: 135 mmol/L (ref 135–145)
TOTAL PROTEIN: 7 g/dL (ref 6.5–8.1)

## 2016-04-22 LAB — I-STAT CREATININE, ED: Creatinine, Ser: 0.5 mg/dL (ref 0.44–1.00)

## 2016-04-22 IMAGING — US US OB LIMITED
1 series · 13 of 26 positions shown · non-contrast
Comparison: none

[Series 1: us ob limited · 13 of 26 slices shown]
[im 2/26]
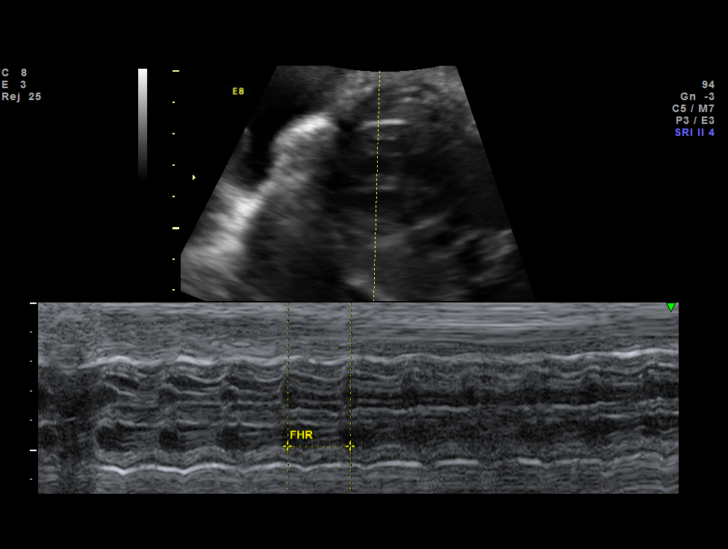
[im 4/26]
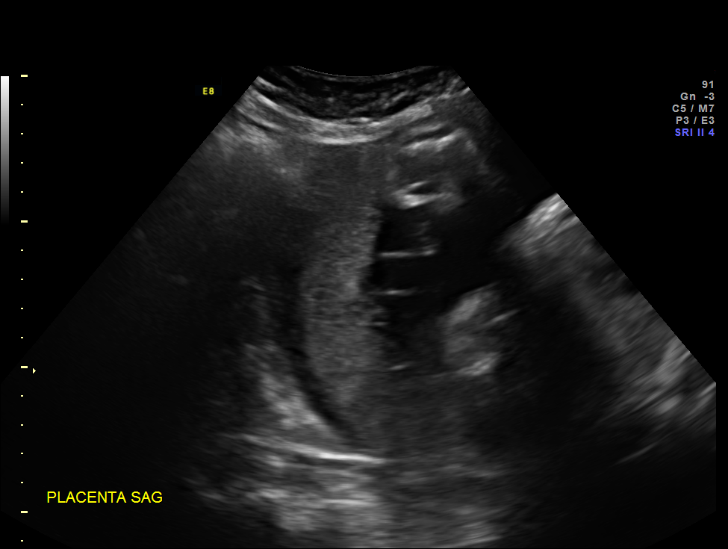
[im 6/26]
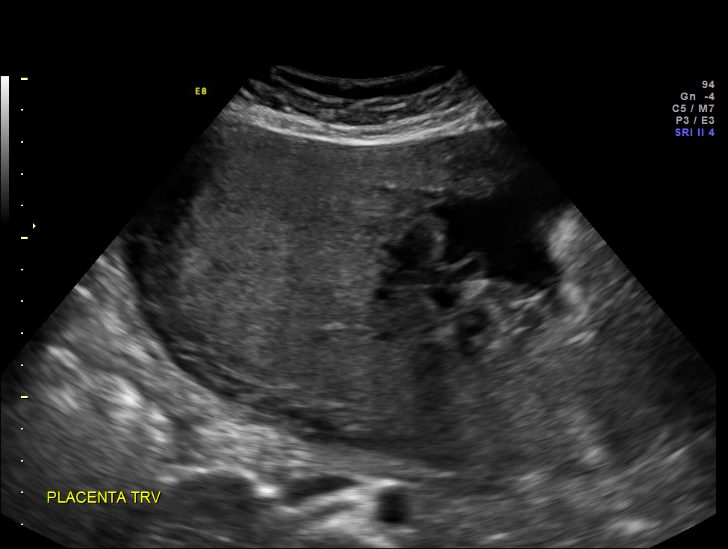
[im 8/26]
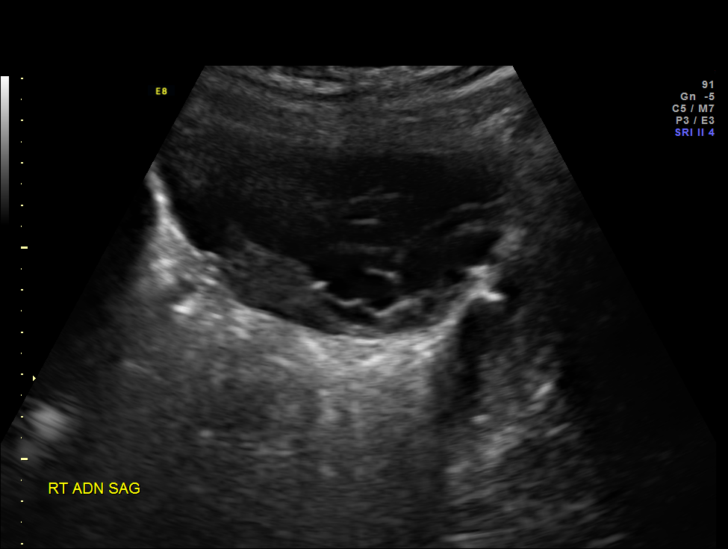
[im 10/26]
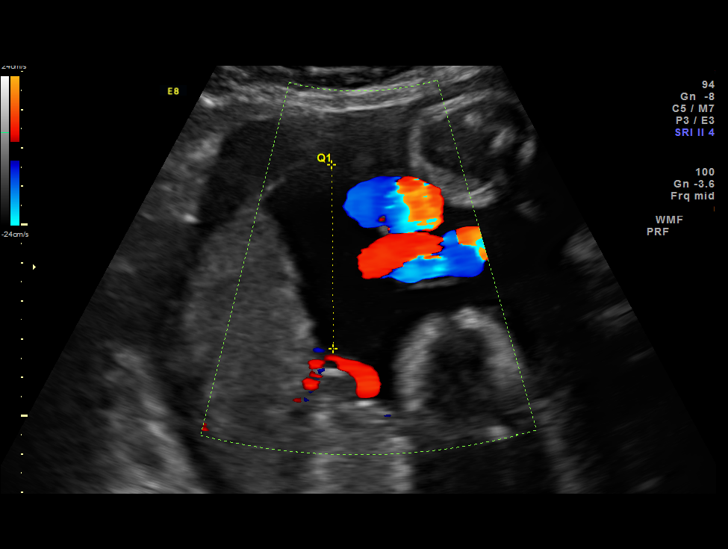
[im 12/26]
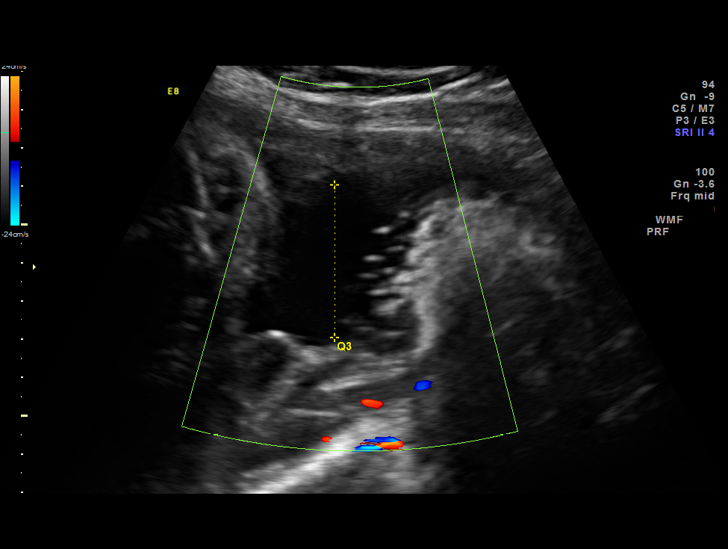
[im 14/26]
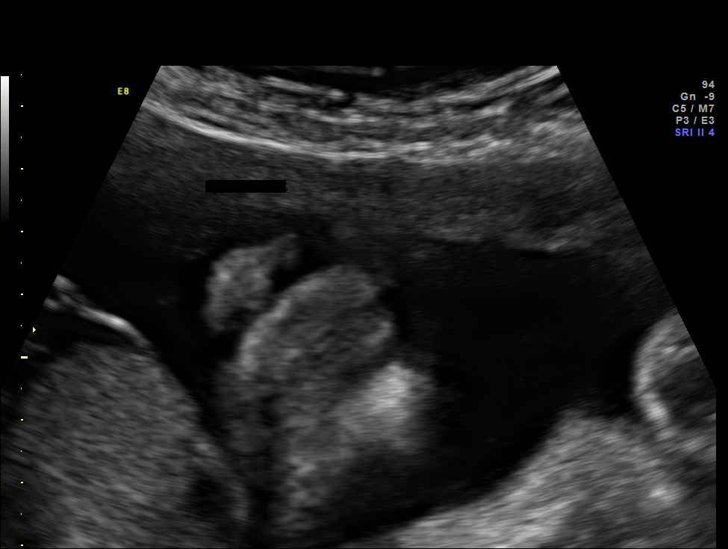
[im 16/26]
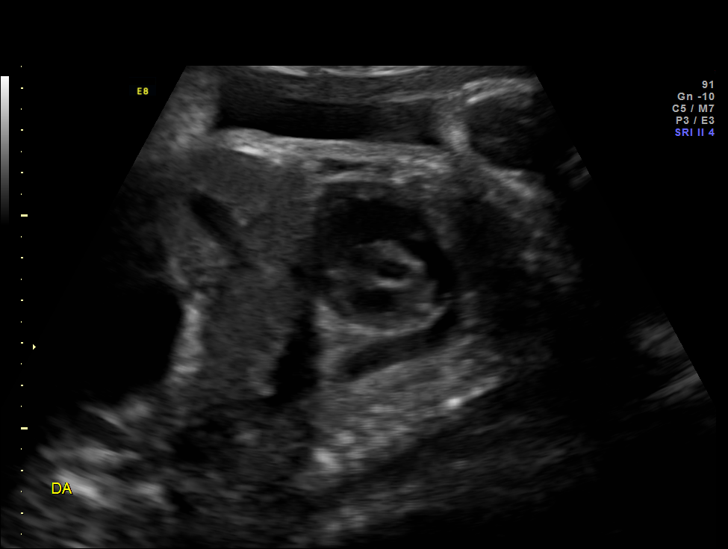
[im 18/26]
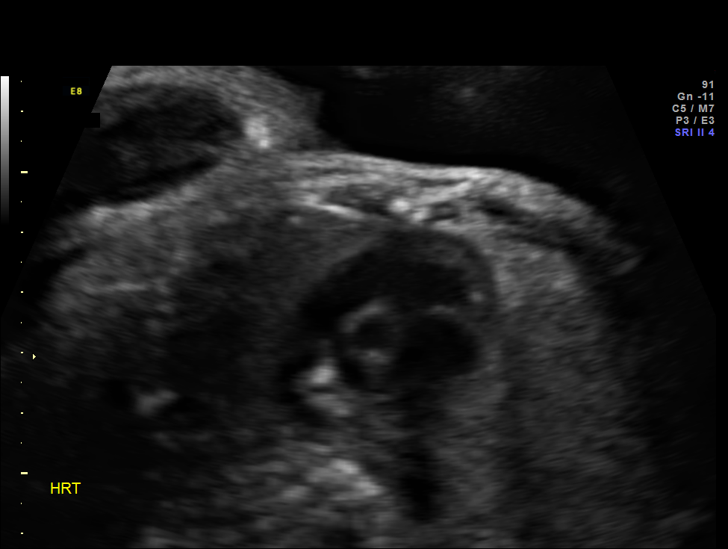
[im 20/26]
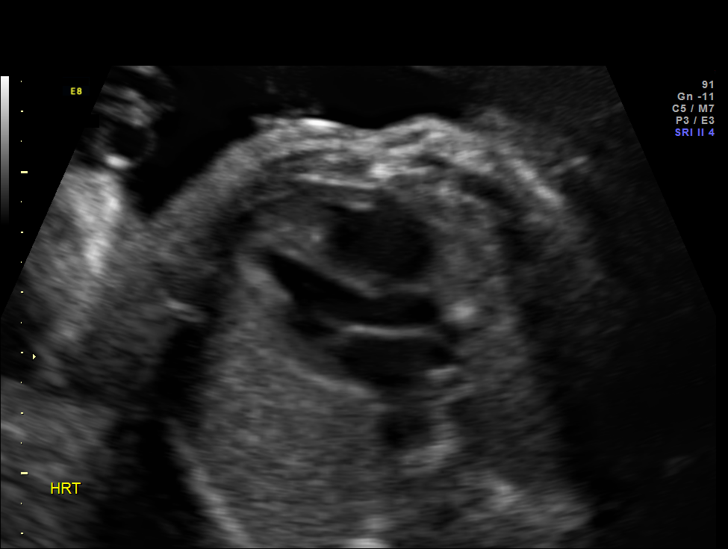
[im 22/26]
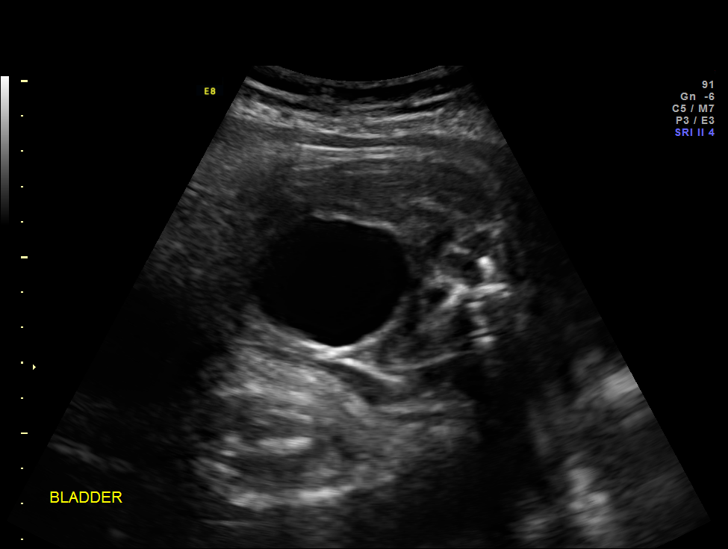
[im 24/26]
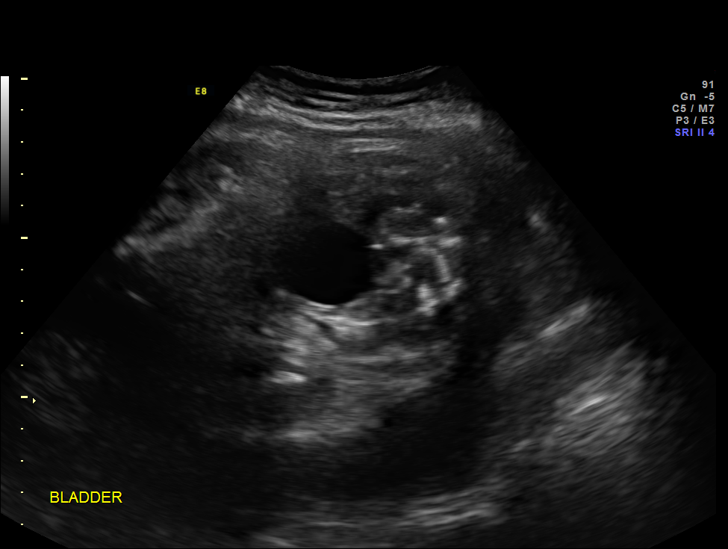
[im 26/26]
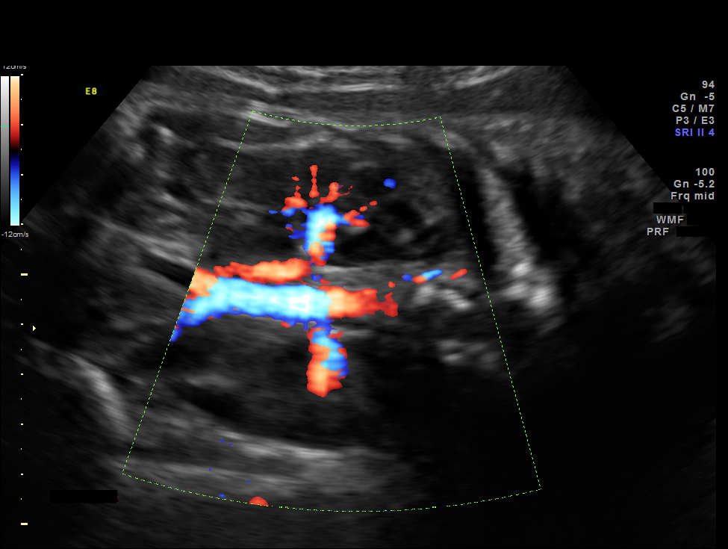

[13 of 26 positions shown; findings below may reference images not displayed]

OBSTETRICS REPORT
(Signed Final 03/12/2015 [DATE])

PAULUS N                           Date:

Service(s) Provided

[HOSPITAL]                                          76815.0
Indications

36 weeks gestation of pregnancy
Traumatic injury during pregnancy (s/p fall)
No or Little Prenatal Care
Fetal Evaluation

Num Of             1
Fetuses:
Fetal Heart        161                          bpm
Rate:
Cardiac Activity:  Observed
Presentation:      Cephalic
Placenta:          Posterior Fundal, above
cervical os
P. Cord            Previously Visualized
Insertion:

Amniotic Fluid
AFI FV:      Subjectively within normal limits
AFI Sum:     14.42    cm      53  %Tile     Larg Pckt:    4.81   cm
RUQ:   4.81    cm    RLQ:   1.8     cm   LUQ:    3.82    cm   LLQ:    3.99   cm
Gestational Age

LMP:           36w 0d        Date:  07/03/14                  EDD:   04/09/15
Best:          36w 0d    Det. By:   LMP  (07/03/14)           EDD:   04/09/15
Anatomy

Lips:             Appears normal         Ductal Arch:       Appears normal
Heart:            Appears normal         Diaphragm:         Appears normal
(4CH, axis, and
situs)
RVOT:             Appears normal         Stomach:           Appears normal,
left sided
LVOT:             Appears normal         Kidneys:           Appear normal
Aortic Arch:      Previously seen        Bladder:           Appears normal
Cervix Uterus Adnexa

Cervix:       Not visualized (advanced GA >01wks)

Left Ovary:    Within normal limits.
Right Ovary:   Within normal limits.

Adnexa:     No abnormality visualized.
Impression

Single IUP at 36w 0d
Limited ultrasound performed due to fall
Cephalic presentation
Posterior fundal placenta
No sonographic findings suggestive of abruption
Normal amniotic fluid volume
Recommendations

Follow-up ultrasounds as clinically indicated.

PAULUS N with us.  Please do not hesitate to contact

## 2016-04-22 MED ORDER — SODIUM CHLORIDE 0.9 % IV BOLUS (SEPSIS)
1000.0000 mL | Freq: Once | INTRAVENOUS | Status: AC
  Administered 2016-04-22: 1000 mL via INTRAVENOUS

## 2016-04-22 MED ORDER — DEXAMETHASONE SODIUM PHOSPHATE 10 MG/ML IJ SOLN
10.0000 mg | Freq: Once | INTRAMUSCULAR | Status: DC
  Filled 2016-04-22: qty 1

## 2016-04-22 MED ORDER — METOCLOPRAMIDE HCL 5 MG/ML IJ SOLN: 10.0000 mg | Freq: Once | INTRAMUSCULAR | Status: DC

## 2016-04-22 MED ORDER — HYDROMORPHONE HCL 1 MG/ML IJ SOLN
0.5000 mg | INTRAMUSCULAR | Status: DC | PRN
  Administered 2016-04-22: 0.5 mg via INTRAVENOUS
  Filled 2016-04-22: qty 1

## 2016-04-22 MED ORDER — HYDROCODONE-ACETAMINOPHEN 5-325 MG PO TABS: 1.0000 | ORAL_TABLET | ORAL | 0 refills | Status: DC | PRN

## 2016-04-22 MED ORDER — IOPAMIDOL (ISOVUE-300) INJECTION 61%
INTRAVENOUS | Status: AC
  Administered 2016-04-22: 75 mL
  Filled 2016-04-22: qty 75

## 2016-04-22 MED ORDER — ONDANSETRON HCL 4 MG/2ML IJ SOLN
4.0000 mg | Freq: Once | INTRAMUSCULAR | Status: DC
  Filled 2016-04-22: qty 2

## 2016-04-22 NOTE — ED Provider Notes (Signed)
  Face-to-face evaluation   History: She presents for persistent sore throat, despite being treated on 04/20/2016 with IM Bicillin LA. She was strep positive.  Physical exam: Alert, cooperative. No trismus. Moderate left tonsillar operative field.  MDM- tonsillitis with mild peritonsillar swelling, but no abscess. Patient has not been treated long enough to consider this to be a penicillin failure, yet.  Medical screening examination/treatment/procedure(s) were conducted as a shared visit with non-physician practitioner(s) and myself.  I personally evaluated the patient during the encounter   Mancel Bale, MD 04/23/16 [phone]

## 2016-04-22 NOTE — ED Provider Notes (Signed)
Care assumed at shift change from [facility], [place]. Briefly, patient presents with persistent sore throat. She was treated with IM penicillin 04/20/2016 after a positive rapid strep test. She presents today with continued pain.   Labs without acute abnormalities. She is pregnant. CT soft tissue neck obtained for further evaluation. This was negative for any peritonsillar retropharyngeal abscess. She has moderate airway narrowing and marketed tonsillar enlargement, compatible with tonsillitis. Patient received IV Dilaudid in the ED with improvement. She is able to tolerate by mouth without difficulty. Plan to discharge home with Norco. Recommend warm salt water gargles. Patient is advised to stop smoking. Recommend prenatal vitamins. Patient has follow-up with women's health for her pregnancy. Return precautions discussed. Patient agrees and acknowledges the above plan for discharge.     Cheri Fowler, PA-C 04/23/16 [id number]    Mancel Bale, MD 04/23/16 0130

## 2016-04-22 NOTE — ED Notes (Signed)
Pt in CT.

## 2016-04-22 NOTE — ED Notes (Signed)
Due to pt's previous treatment with antibiotics, and PA assessment, pt's acuity upgraded

## 2016-04-22 NOTE — ED Notes (Signed)
Pt informed RN during medication preparation that she is currently [redacted] weeks pregnant.  PA made aware.  Medications will be reassessed before administration.

## 2016-04-22 NOTE — ED Notes (Signed)
Pt. Able to drink water with no issues. Pt. Does c/o pain with swallowing

## 2016-04-22 NOTE — ED Provider Notes (Signed)
[facility] Provider Note   CSN: [id number] Arrival date & time: 04/22/16  1333  First Provider Contact:   First MD Initiated Contact with Patient 04/22/16 1540     By signing my name below, I, Essence Howell, attest that this documentation has been prepared under the direction and in the presence of Arthor Captain, PA-C Electronically Signed: Charline Bills, ED Scribe 04/22/2016 at 3:53 PM.   History   Chief Complaint Chief Complaint  Patient presents with  . Sore Throat    HPI Mary Mitchell is a 20 y.o. female who presents to the Emergency Department complaining of a gradually worsening sore throat over the past several days. Pt reports increased pain with swallowing her own secretions and speaking. She was seen in the ED 2 days ago for 1 week of sore throat and given a penicillin injection but has had increased pain since. She also reports a "knot" in the back of her throat. No other treatments tried PTA.   Patient is [redacted] weeks pregnant.  The history is provided by the patient. No language interpreter was used.    Past Medical History:  Diagnosis Date  . ADHD (attention deficit hyperactivity disorder)   . Asthma   . Bipolar 1 disorder (HCC)   . PTSD (post-traumatic stress disorder)     Patient Active Problem List   Diagnosis Date Noted  . History of marijuana use 03/21/2016  . Supervision of normal subsequent pregnancy 04/01/2015  . Rh negative state in antepartum period 01/31/2014  . Mild intermittent asthma 01/30/2014  . Tobacco abuse 01/30/2014    Past Surgical History:  Procedure Laterality Date  . ABDOMINAL HERNIA REPAIR    . HERNIA REPAIR      OB History    Gravida Para Term Preterm AB Living   0 0 5   SAB TAB Ectopic Multiple Live Births   0 0 0 0 5       Home Medications    Prior to Admission medications   Medication Sig Start Date End Date Taking? Authorizing Provider  HYDROcodone-acetaminophen (NORCO/VICODIN) 5-325 MG tablet  Take 2 tablets by mouth every 4 (four) hours as needed. 04/20/16   Elson Areas, PA-C  Prenatal Multivit-Min-Fe-FA (PRENATAL VITAMINS) 0.8 MG tablet Take 1 tablet by mouth daily. Patient not taking: Reported on 03/23/2016 03/10/16   Adam Phenix, MD  PROAIR HFA 108 (90 BASE) MCG/ACT inhaler Inhale 2 puffs into the lungs every 6 (six) hours as needed. Reported on 03/23/2016 04/01/15   Historical Provider, MD    Family History Family History  Problem Relation Age of Onset  . Diabetes Mother   . Hypertension Mother   . Diabetes Maternal Grandmother   . Hypertension Maternal Grandmother   . Heart disease Maternal Grandmother   . Cystic fibrosis Daughter     Social History Social History  Substance Use Topics  . Smoking status: Light Tobacco Smoker    Packs/day: 0.00    Types: Cigarettes  . Smokeless tobacco: Never Used  . Alcohol use No     Allergies   Review of patient's allergies indicates no known allergies.   Review of Systems Review of Systems  Constitutional: Negative for fever.  HENT: Positive for sore throat.     Physical Exam Updated Vital Signs BP 118/63 (BP Location: Right Arm)   Pulse 95   Temp 98.6 F (37 C) (Oral)   Resp 14   Ht  (1.575 m)   Wt  89.6 kg   LMP 10/21/2015 (Exact Date)   SpO2 98%   BMI 36.11 kg/m   Physical Exam  Constitutional: She is oriented to person, place, and time. She appears well-developed and well-nourished. No distress.  HENT:  Head: Normocephalic and atraumatic.  L tonsil is extremely swollen to the midline of the throat No trismus or uvular deviation  R anterior neck  Is swollen and firm. Very ttp. Hot potato voice Not tolerating her own secretions   Eyes: Conjunctivae and EOM are normal.  Neck: Neck supple. No tracheal deviation present.  Cardiovascular: Normal rate.   Pulmonary/Chest: Effort normal. No respiratory distress.  Musculoskeletal: Normal range of motion.  Neurological: She is alert and oriented to  person, place, and time.  Skin: Skin is warm and dry.  Psychiatric: She has a normal mood and affect. Her behavior is normal.  Nursing note and vitals reviewed.  ED Treatments / Results  Labs (all labs ordered are listed, but only abnormal results are displayed) Labs Reviewed  CBC WITH DIFFERENTIAL/PLATELET - Abnormal; Notable for the following:       Result Value   Hemoglobin 11.6 (*)    MCH 25.2 (*)    All other components within normal limits  I-STAT BETA HCG BLOOD, ED ([facility], [facility], [facility] ONLY) - Abnormal; Notable for the following:    I-stat hCG, quantitative >2,000.0 (*)    All other components within normal limits  COMPREHENSIVE METABOLIC PANEL  I-STAT CREATININE, ED    EKG  EKG Interpretation None      Radiology No results found.  Procedures Procedures (including critical care time) DIAGNOSTIC STUDIES: Oxygen Saturation is 98% on RA, normal by my interpretation.    COORDINATION OF CARE: 3:41 PM-Discussed treatment plan which includes lab work, IV fluids, decadron injection and Dilaudid with pt at bedside and pt agreed to plan.   Medications Ordered in ED Medications  HYDROmorphone (DILAUDID) injection 0.5 mg (not administered)  ondansetron (ZOFRAN) injection 4 mg (not administered)  iopamidol (ISOVUE-300) 61 % injection (not administered)  sodium chloride 0.9 % bolus 1,000 mL (1,000 mLs Intravenous New Bag/Given 04/22/16 1611)   Initial Impression / Assessment and Plan / ED Course  I have reviewed the triage vital signs and the nursing notes.  Pertinent labs & imaging results that were available during my care of the patient were reviewed by me and considered in my medical decision making (see chart for details).  Clinical Course  Comment By Time  Patient with worsening sore throat, treated with IM PCN. I have concern for deep space infection of the neck, vs pta. I have given sign out to PA [facility] and Dr. Effie Shy. Arthor Captain, PA-C 08/04 1622    I personally  performed the services described in this documentation, which was scribed in my presence. The recorded information has been reviewed and is accurate.       Final Clinical Impressions(s) / ED Diagnoses   Final diagnoses:  None    New Prescriptions New Prescriptions   No medications on file     Arthor Captain, PA-C 04/22/16 1624    Shaune Pollack, MD 04/24/16 1133

## 2016-04-22 NOTE — ED Triage Notes (Signed)
Pt here for sore throat that has gotten worse. sts was seen here and given a shot of PCN. sts she it out of pain meds. sts there is a knot where the shot was given.

## 2016-04-27 ENCOUNTER — Ambulatory Visit (INDEPENDENT_AMBULATORY_CARE_PROVIDER_SITE_OTHER): Payer: Medicaid Other | Admitting: Family Medicine

## 2016-04-27 VITALS — BP 124/65 | HR 94 | Wt 193.0 lb

## 2016-04-27 DIAGNOSIS — Z720 Tobacco use: Secondary | ICD-10-CM

## 2016-04-27 DIAGNOSIS — Z87898 Personal history of other specified conditions: Secondary | ICD-10-CM

## 2016-04-27 DIAGNOSIS — J4520 Mild intermittent asthma, uncomplicated: Secondary | ICD-10-CM

## 2016-04-27 DIAGNOSIS — F1291 Cannabis use, unspecified, in remission: Secondary | ICD-10-CM

## 2016-04-27 DIAGNOSIS — O360120 Maternal care for anti-D [Rh] antibodies, second trimester, not applicable or unspecified: Secondary | ICD-10-CM

## 2016-04-27 DIAGNOSIS — Z3482 Encounter for supervision of other normal pregnancy, second trimester: Secondary | ICD-10-CM

## 2016-04-27 LAB — POCT URINALYSIS DIP (DEVICE)
GLUCOSE, UA: NEGATIVE mg/dL
Hgb urine dipstick: NEGATIVE
Leukocytes, UA: NEGATIVE
Nitrite: NEGATIVE
PROTEIN: 30 mg/dL — AB
UROBILINOGEN UA: 4 mg/dL — AB (ref 0.0–1.0)
pH: 6 (ref 5.0–8.0)

## 2016-04-27 NOTE — Patient Instructions (Addendum)
Second Trimester of Pregnancy The second trimester is from week 13 through week 28, month 4 through 6. This is often the time in pregnancy that you feel your best. Often times, morning sickness has lessened or quit. You may have more energy, and you may get hungry more often. Your unborn baby (fetus) is growing rapidly. At the end of the sixth month, he or she is about 9 inches long and weighs about 1 pounds. You will likely feel the baby move (quickening) between 18 and 20 weeks of pregnancy. HOME CARE   Avoid all smoking, herbs, and alcohol. Avoid drugs not approved by your doctor.  Do not use any tobacco products, including cigarettes, chewing tobacco, and electronic cigarettes. If you need help quitting, ask your doctor. You may get counseling or other support to help you quit.  Only take medicine as told by your doctor. Some medicines are safe and some are not during pregnancy.  Exercise only as told by your doctor. Stop exercising if you start having cramps.  Eat regular, healthy meals.  Wear a good support bra if your breasts are tender.  Do not use hot tubs, steam rooms, or saunas.  Wear your seat belt when driving.  Avoid raw meat, uncooked cheese, and liter boxes and soil used by cats.  Take your prenatal vitamins.  Take 1500-2000 milligrams of calcium daily starting at the 20th week of pregnancy until you deliver your baby.  Try taking medicine that helps you poop (stool softener) as needed, and if your doctor approves. Eat more fiber by eating fresh fruit, vegetables, and whole grains. Drink enough fluids to keep your pee (urine) clear or pale yellow.  Take warm water baths (sitz baths) to soothe pain or discomfort caused by hemorrhoids. Use hemorrhoid cream if your doctor approves.  If you have puffy, bulging veins (varicose veins), wear support hose. Raise (elevate) your feet for 15 minutes, 3-4 times a day. Limit salt in your diet.  Avoid heavy lifting, wear low heals,  and sit up straight.  Rest with your legs raised if you have leg cramps or low back pain.  Visit your dentist if you have not gone during your pregnancy. Use a soft toothbrush to brush your teeth. Be gentle when you floss.  You can have sex (intercourse) unless your doctor tells you not to.  Go to your doctor visits. GET HELP IF:   You feel dizzy.  You have mild cramps or pressure in your lower belly (abdomen).  You have a nagging pain in your belly area.  You continue to feel sick to your stomach (nauseous), throw up (vomit), or have watery poop (diarrhea).  You have bad smelling fluid coming from your vagina.  You have pain with peeing (urination). GET HELP RIGHT AWAY IF:   You have a fever.  You are leaking fluid from your vagina.  You have spotting or bleeding from your vagina.  You have severe belly cramping or pain.  You lose or gain weight rapidly.  You have trouble catching your breath and have chest pain.  You notice sudden or extreme puffiness (swelling) of your face, hands, ankles, feet, or legs.  You have not felt the baby move in over an hour.  You have severe headaches that do not go away with medicine.  You have vision changes.   This information is not intended to replace advice given to you by your health care provider. Make sure you discuss any questions you have with your  health care provider.   Document Released: 11/30/2009 Document Revised: 09/26/2014 Document Reviewed: 11/06/2012 Elsevier Interactive Patient Education 2016 Elsevier Inc.   Rh Incompatibility Rh incompatibility is a condition that occurs during pregnancy if a woman has Rh-negative blood and her baby has Rh-positive blood. "Rh-negative" and "Rh-positive" refer to whether or not the blood has an Rh factor. An Rh factor is a specific protein found on the surface of red blood cells. If a woman has Rh factor, she is Rh-positive. If she does not have an Rh factor, she is Rh-negative.  Having or not having an Rh factor does not affect the mother's general health. However, it can cause problems during pregnancy.  WHAT KIND OF PROBLEMS CAN Rh INCOMPATIBILITY CAUSE? During pregnancy, blood from the baby can cross into the mother's bloodstream, especially during delivery. If a mother is Rh-negative and the baby is Rh-positive, the mother's defense system will react to the baby's blood as if it was a foreign substance and will create proteins (antibodies). This is called sensitization. Once the mother is sensitized, her Rh antibodies will cross the placenta to the baby and attack the baby's Rh-positive blood as if it is a harmful substance.  Rh incompatibility can also happen if the Rh-negative pregnant woman is exposed to the Rh factor during a blood transfusion with Rh-positive blood.  HOW DOES THIS CONDITION AFFECT MY BABY? The Rh antibodies that attack and destroy the baby's red blood cells can lead to hemolytic disease in the baby. Hemolytic disease is when the red blood cells break down. This can cause:   Yellowing of the skin and eyes (jaundice).  The body to not have enough healthy red blood cells (anemia).   Brain damage.   Heart failure.   Death.  These antibodies usually do not cause problems during a first pregnancy. This is because the blood from the baby often times crosses into the mother's bloodstream during delivery, and the baby is born before many of the antibodies can develop. However, the antibodies stay in your body once they have formed. Because of this, Rh incompatibility is more likely to cause problems in second or later pregnancies (if the baby is Rh-positive).  HOW IS THIS CONDITION DIAGNOSED? When a woman becomes pregnant, blood tests may be done to find out her blood type and Rh factor. If the woman is Rh-negative, she also may have another blood test called an antibody screen. The antibody screen shows whether she has Rh antibodies in her blood. If  she does, it means she was exposed to Rh-positive blood before, and she is at risk for Rh incompatibility.  To find out whether the baby is developing hemolytic anemia and how serious it is, caregivers may use more advanced tests, such as ultrasonography (commonly known as ultrasound).  HOW IS Rh INCOMPATIBILITY TREATED?  Rh incompatibility is treated with a shot of medicine called Rho (D) immune globulin. This medicine keeps the woman's body from making antibodies that can cause serious problems in the baby or future babies.  Two shots will be given, one at around your seventh month of pregnancy and the other within 72 hours of your baby being born. If you are Rh-negative, you will need this medicine every time you have a baby with Rh-positive blood. If you already have antibodies in your blood, Rho (D) immune globulin will not help. Your doctor will not give you this medicine, but will watch your pregnancy closely for problems instead.  This shot may also be  given to an Rh-negative woman when the risk of blood transfer between the mom and baby is high. The risk is high with:   An amniocentesis.   A miscarriage or an abortion.   An ectopic pregnancy.   Any vaginal bleeding during pregnancy.    This information is not intended to replace advice given to you by your health care provider. Make sure you discuss any questions you have with your health care provider.   Document Released: 02/25/2002 Document Revised: 09/10/2013 Document Reviewed: 12/18/2012 Elsevier Interactive Patient Education [employer]

## 2016-04-27 NOTE — Progress Notes (Signed)
Subjective:  Mary Mitchell is a 20 y.o. G6P5005 at [redacted]w[redacted]d being seen today for ongoing prenatal care.  She is currently monitored for the following issues for this low-risk pregnancy and has Mild intermittent asthma; Tobacco abuse; Rh negative state in antepartum period; Supervision of normal subsequent pregnancy; and History of marijuana use on her problem list.  Patient reports no complaints.  Contractions: Not present. Vag. Bleeding: None.  Movement: Absent. Denies leaking of fluid.   The following portions of the patient's history were reviewed and updated as appropriate: allergies, current medications, past family history, past medical history, past social history, past surgical history and problem list. Problem list updated.  Objective:   Vitals:   04/27/16 0942  BP: 124/65  Pulse: 94  Weight: 193 lb (87.5 kg)    Fetal Status: Fetal Heart Rate (bpm): 140   Movement: Absent     General:  Alert, oriented and cooperative. Patient is in no acute distress.  Skin: Skin is warm and dry. No rash noted.   Cardiovascular: Normal heart rate noted  Respiratory: Normal respiratory effort, no problems with respiration noted  Abdomen: Soft, gravid, appropriate for gestational age. Pain/Pressure: Absent     Pelvic:  Cervical exam deferred        Extremities: Normal range of motion.  Edema: None  Mental Status: Normal mood and affect. Normal behavior. Normal judgment and thought content.   Urinalysis: Urine Protein: 1+ Urine Glucose: Negative  Assessment and Plan:  Pregnancy: G6P5005 at 563w1d  1. Encounter for supervision of other normal pregnancy in second trimester - Fetal survey US  2. Rh negative state in antepartum period, second trimester, not applicable or unspecified fetus - Discussed indications for rhogam - Reviewed labs: Previous Anti-D from 01/30/14 no longer detected.   3. Tobacco abuse - encouraged to quit smoking. 3 cig/day down from 1ppd.   4. History of  marijuana use  5. Mild intermittent asthma, uncomplicated - Controlled  Preterm labor symptoms and general obstetric precautions including but not limited to vaginal bleeding, contractions, leaking of fluid and fetal movement were reviewed in detail with the patient. Please refer to After Visit Summary for other counseling recommendations.  No Follow-up on file.   [facility] CenterElizabeth Woodland ManMumaw, [place]

## 2016-04-28 ENCOUNTER — Encounter (HOSPITAL_COMMUNITY): Payer: Self-pay | Admitting: Obstetrics & Gynecology

## 2016-05-03 ENCOUNTER — Encounter (HOSPITAL_COMMUNITY): Payer: Self-pay

## 2016-05-03 ENCOUNTER — Ambulatory Visit (HOSPITAL_COMMUNITY)
Admission: RE | Admit: 2016-05-03 | Discharge: 2016-05-03 | Disposition: A | Discharge status: Home / Self Care | Payer: Medicaid Other | Source: Ambulatory Visit | Attending: Obstetrics & Gynecology | Admitting: Obstetrics & Gynecology

## 2016-05-03 ENCOUNTER — Other Ambulatory Visit: Payer: Self-pay | Admitting: Obstetrics & Gynecology

## 2016-05-03 ENCOUNTER — Ambulatory Visit (HOSPITAL_COMMUNITY): Admission: RE | Admit: 2016-05-03 | Payer: Medicaid Other | Source: Ambulatory Visit

## 2016-05-03 DIAGNOSIS — O09292 Supervision of pregnancy with other poor reproductive or obstetric history, second trimester: Secondary | ICD-10-CM | POA: Diagnosis not present

## 2016-05-03 DIAGNOSIS — Z3689 Encounter for other specified antenatal screening: Secondary | ICD-10-CM

## 2016-05-03 DIAGNOSIS — Z3481 Encounter for supervision of other normal pregnancy, first trimester: Secondary | ICD-10-CM

## 2016-05-03 DIAGNOSIS — O99322 Drug use complicating pregnancy, second trimester: Secondary | ICD-10-CM | POA: Insufficient documentation

## 2016-05-03 DIAGNOSIS — Z36 Encounter for antenatal screening of mother: Secondary | ICD-10-CM | POA: Insufficient documentation

## 2016-05-03 DIAGNOSIS — Z141 Cystic fibrosis carrier: Secondary | ICD-10-CM | POA: Insufficient documentation

## 2016-05-03 DIAGNOSIS — O09892 Supervision of other high risk pregnancies, second trimester: Secondary | ICD-10-CM | POA: Insufficient documentation

## 2016-05-03 DIAGNOSIS — Z3492 Encounter for supervision of normal pregnancy, unspecified, second trimester: Secondary | ICD-10-CM

## 2016-05-03 DIAGNOSIS — Z3A19 19 weeks gestation of pregnancy: Secondary | ICD-10-CM | POA: Insufficient documentation

## 2016-05-10 ENCOUNTER — Encounter (HOSPITAL_COMMUNITY): Payer: Medicaid Other

## 2016-05-11 ENCOUNTER — Ambulatory Visit (HOSPITAL_COMMUNITY): Admission: RE | Admit: 2016-05-11 | Payer: Medicaid Other | Source: Ambulatory Visit

## 2016-05-11 ENCOUNTER — Encounter (HOSPITAL_COMMUNITY): Payer: Self-pay

## 2016-05-30 ENCOUNTER — Encounter: Payer: Medicaid Other | Admitting: Obstetrics & Gynecology

## 2016-06-01 ENCOUNTER — Encounter: Payer: Medicaid Other | Admitting: Family

## 2016-06-29 ENCOUNTER — Encounter: Payer: Medicaid Other | Admitting: Family

## 2016-06-29 ENCOUNTER — Encounter: Payer: Self-pay | Admitting: Family

## 2016-07-01 ENCOUNTER — Inpatient Hospital Stay (HOSPITAL_COMMUNITY)
Admission: AD | Admit: 2016-07-01 | Discharge: 2016-07-01 | Disposition: A | Discharge status: Home / Self Care | Payer: Medicaid Other | Source: Ambulatory Visit | Attending: Obstetrics & Gynecology | Admitting: Obstetrics & Gynecology

## 2016-07-01 ENCOUNTER — Encounter (HOSPITAL_COMMUNITY): Payer: Self-pay | Admitting: *Deleted

## 2016-07-01 DIAGNOSIS — Z3A28 28 weeks gestation of pregnancy: Secondary | ICD-10-CM | POA: Diagnosis not present

## 2016-07-01 DIAGNOSIS — O99332 Smoking (tobacco) complicating pregnancy, second trimester: Secondary | ICD-10-CM | POA: Diagnosis not present

## 2016-07-01 DIAGNOSIS — O99513 Diseases of the respiratory system complicating pregnancy, third trimester: Secondary | ICD-10-CM

## 2016-07-01 DIAGNOSIS — O99512 Diseases of the respiratory system complicating pregnancy, second trimester: Secondary | ICD-10-CM | POA: Diagnosis not present

## 2016-07-01 DIAGNOSIS — R0602 Shortness of breath: Secondary | ICD-10-CM | POA: Diagnosis present

## 2016-07-01 DIAGNOSIS — J4520 Mild intermittent asthma, uncomplicated: Secondary | ICD-10-CM

## 2016-07-01 DIAGNOSIS — Z3A27 27 weeks gestation of pregnancy: Secondary | ICD-10-CM | POA: Diagnosis not present

## 2016-07-01 DIAGNOSIS — F17210 Nicotine dependence, cigarettes, uncomplicated: Secondary | ICD-10-CM | POA: Insufficient documentation

## 2016-07-01 DIAGNOSIS — R9431 Abnormal electrocardiogram [ECG] [EKG]: Secondary | ICD-10-CM

## 2016-07-01 LAB — CBC
HCT: 31 % — ABNORMAL LOW (ref 36.0–46.0)
Hemoglobin: 10.2 g/dL — ABNORMAL LOW (ref 12.0–15.0)
MCH: 25.4 pg — AB (ref 26.0–34.0)
MCHC: 32.9 g/dL (ref 30.0–36.0)
MCV: 77.3 fL — AB (ref 78.0–100.0)
PLATELETS: 234 10*3/uL (ref 150–400)
RBC: 4.01 MIL/uL (ref 3.87–5.11)
RDW: 14.1 % (ref 11.5–15.5)
WBC: 6.9 10*3/uL (ref 4.0–10.5)

## 2016-07-01 LAB — COMPREHENSIVE METABOLIC PANEL
ALT: 7 U/L — AB (ref 14–54)
AST: 13 U/L — ABNORMAL LOW (ref 15–41)
Albumin: 3 g/dL — ABNORMAL LOW (ref 3.5–5.0)
Alkaline Phosphatase: 77 U/L (ref 38–126)
Anion gap: 6 (ref 5–15)
BUN: 5 mg/dL — ABNORMAL LOW (ref 6–20)
CALCIUM: 8.5 mg/dL — AB (ref 8.9–10.3)
CHLORIDE: 104 mmol/L (ref 101–111)
CO2: 23 mmol/L (ref 22–32)
CREATININE: 0.4 mg/dL — AB (ref 0.44–1.00)
Glucose, Bld: 77 mg/dL (ref 65–99)
Potassium: 3.5 mmol/L (ref 3.5–5.1)
Sodium: 133 mmol/L — ABNORMAL LOW (ref 135–145)
Total Bilirubin: 0.4 mg/dL (ref 0.3–1.2)
Total Protein: 6.6 g/dL (ref 6.5–8.1)

## 2016-07-01 LAB — URINALYSIS, ROUTINE W REFLEX MICROSCOPIC
BILIRUBIN URINE: NEGATIVE
Glucose, UA: NEGATIVE mg/dL
HGB URINE DIPSTICK: NEGATIVE
Ketones, ur: 40 mg/dL — AB
Leukocytes, UA: NEGATIVE
Nitrite: NEGATIVE
PROTEIN: NEGATIVE mg/dL
Specific Gravity, Urine: 1.015 (ref 1.005–1.030)
pH: 7.5 (ref 5.0–8.0)

## 2016-07-01 LAB — MAGNESIUM: MAGNESIUM: 1.7 mg/dL (ref 1.7–2.4)

## 2016-07-01 MED ORDER — MONTELUKAST SODIUM 10 MG PO TABS: 10.0000 mg | ORAL_TABLET | Freq: Every day | ORAL | 0 refills | Status: DC

## 2016-07-01 MED ORDER — ALBUTEROL SULFATE (2.5 MG/3ML) 0.083% IN NEBU
2.5000 mg | INHALATION_SOLUTION | Freq: Once | RESPIRATORY_TRACT | Status: AC
  Administered 2016-07-01: 2.5 mg via RESPIRATORY_TRACT
  Filled 2016-07-01: qty 3

## 2016-07-01 MED ORDER — IPRATROPIUM BROMIDE 0.02 % IN SOLN
0.5000 mg | Freq: Once | RESPIRATORY_TRACT | Status: AC
  Administered 2016-07-01: 0.5 mg via RESPIRATORY_TRACT
  Filled 2016-07-01: qty 2.5

## 2016-07-01 MED ORDER — ALBUTEROL SULFATE HFA 108 (90 BASE) MCG/ACT IN AERS: 1.0000 | INHALATION_SPRAY | Freq: Four times a day (QID) | RESPIRATORY_TRACT | 0 refills | Status: DC | PRN

## 2016-07-01 NOTE — MAU Note (Signed)
Breathing rx in process.

## 2016-07-01 NOTE — MAU Provider Note (Signed)
History     CSN: [id number]  Arrival date and time: 07/01/16 [id number]    First Provider Initiated Contact with Patient 07/01/16 1040       Chief Complaint  Patient presents with  . Shortness of Breath  . Fatigue   Mary Mitchell is a 20 y.o. [id number] at 4558w3d who presents with shortness of breath and weakness. PMH significant for asthma; pt has not seen her PCP for asthma in over a year and has run out of inhaler. Also reports feeling weak throughout the day for the last 2 weeks. Reports sleeping well through the night and eating regular meals during the day. Denies syncope. Denies abdominal pain, LOF, or vaginal bleeding. Feels abdominal tightening "maybe" twice a day, no pain. Positive fetal movement.    Shortness of Breath  This is a new problem. The current episode started in the past 7 days. The problem occurs daily. The problem has been unchanged. The average episode lasts 15 minutes. Associated symptoms include chest pain (occ sharp pains in upper right chest that last for seconds at a time) and wheezing (occ wheezes). Pertinent negatives include no abdominal pain, ear pain, fever, headaches, hemoptysis, leg swelling, rhinorrhea, sore throat, sputum production, syncope or vomiting. Nothing aggravates the symptoms. Risk factors: pregnancy. She has tried nothing for the symptoms. Her past medical history is significant for allergies and asthma.    OB History    Gravida Para Term Preterm AB Living   6 5 5  0 0 5   SAB TAB Ectopic Multiple Live Births   0 0 0 0 5      Past Medical History:  Diagnosis Date  . ADHD (attention deficit hyperactivity disorder)   . Asthma   . Bipolar 1 disorder (HCC)   . PTSD (post-traumatic stress disorder)     Past Surgical History:  Procedure Laterality Date  . ABDOMINAL HERNIA REPAIR    . HERNIA REPAIR      Family History  Problem Relation Age of Onset  . Diabetes Mother   . Hypertension Mother   . Diabetes Maternal Grandmother   .  Hypertension Maternal Grandmother   . Heart disease Maternal Grandmother   . Cystic fibrosis Daughter     Social History  Substance Use Topics  . Smoking status: Light Tobacco Smoker    Packs/day: 0.25    Types: Cigarettes  . Smokeless tobacco: Never Used  . Alcohol use No    Allergies: No Known Allergies  Prescriptions Prior to Admission  Medication Sig Dispense Refill Last Dose  . HYDROcodone-acetaminophen (NORCO/VICODIN) 5-325 MG tablet Take 1 tablet by mouth every 4 (four) hours as needed. (Patient not taking: Reported on 05/03/2016) 15 tablet 0 Not Taking  . Prenatal Multivit-Min-Fe-FA (PRENATAL VITAMINS) 0.8 MG tablet Take 1 tablet by mouth daily. (Patient not taking: Reported on 05/03/2016) 30 tablet 12 Not Taking  . PROAIR HFA 108 (90 BASE) MCG/ACT inhaler Inhale 2 puffs into the lungs every 6 (six) hours as needed. Reported on 03/23/2016  0 Not Taking    Review of Systems  Constitutional: Negative for chills, fever and malaise/fatigue.  HENT: Negative.  Negative for ear pain, rhinorrhea and sore throat.   Respiratory: Positive for shortness of breath and wheezing (occ wheezes). Negative for cough, hemoptysis and sputum production.   Cardiovascular: Positive for chest pain (occ sharp pains in upper right chest that last for seconds at a time). Negative for palpitations, leg swelling and syncope.  Gastrointestinal: Negative.  Negative for abdominal  pain and vomiting.  Genitourinary: Negative.   Neurological: Positive for weakness. Negative for dizziness, loss of consciousness and headaches.   Physical Exam   Blood pressure 116/67, pulse 92, temperature 98.5 F (36.9 C), temperature source Oral, resp. rate 16, weight 195 lb 4 oz (88.6 kg), last menstrual period 10/21/2015, SpO2 100 %, unknown if currently breastfeeding.  Temp:  [98.5 F (36.9 C)] 98.5 F (36.9 C) (10/13 1007) Pulse Rate:  [84-94] 94 (10/13 1236) Resp:  [16-18] 18 (10/13 1236) BP: (116-122)/(62-67) 122/62  (10/13 1236) SpO2:  [99 %-100 %] 99 % (10/13 1058) Weight:  [195 lb 4 oz (88.6 kg)] 195 lb 4 oz (88.6 kg) (10/13 1007)  Physical Exam  Nursing note and vitals reviewed. Constitutional: She is oriented to person, place, and time. She appears well-developed and well-nourished. No distress.  HENT:  Head: Normocephalic and atraumatic.  Eyes: Conjunctivae are normal. Right eye exhibits no discharge. Left eye exhibits no discharge. No scleral icterus.  Neck: Normal range of motion.  Cardiovascular: Normal rate, regular rhythm and normal heart sounds.   No murmur heard. Respiratory: Effort normal and breath sounds normal. No respiratory distress. She has no wheezes. She exhibits no tenderness.  GI: Soft. There is no tenderness.  Neurological: She is alert and oriented to person, place, and time.  Skin: Skin is warm and dry. She is not diaphoretic.  Psychiatric: She has a normal mood and affect. Her behavior is normal. Judgment and thought content normal.   Fetal Tracing:  Baseline: 130 Variability: moderate Accelerations: 15x15 Decelerations: none  Toco: none  MAU Course  Procedures Results for orders placed or performed during the hospital encounter of 07/01/16 (from the past 24 hour(s))  CBC     Status: Abnormal   Collection Time: 07/01/16 10:56 AM  Result Value Ref Range   WBC 6.9 4.0 - 10.5 K/uL   RBC 4.01 3.87 - 5.11 MIL/uL   Hemoglobin 10.2 (L) 12.0 - 15.0 g/dL   HCT 16.1 (L) 09.6 - 04.5 %   MCV 77.3 (L) 78.0 - 100.0 fL   MCH 25.4 (L) 26.0 - 34.0 pg   MCHC 32.9 30.0 - 36.0 g/dL   RDW 40.9 81.1 - 91.4 %   Platelets 234 150 - 400 K/uL  Comprehensive metabolic panel     Status: Abnormal   Collection Time: 07/01/16 10:56 AM  Result Value Ref Range   Sodium 133 (L) 135 - 145 mmol/L   Potassium 3.5 3.5 - 5.1 mmol/L   Chloride 104 101 - 111 mmol/L   CO2 23 22 - 32 mmol/L   Glucose, Bld 77 65 - 99 mg/dL   BUN 5 (L) 6 - 20 mg/dL   Creatinine, Ser 7.82 (L) 0.44 - 1.00 mg/dL    Calcium 8.5 (L) 8.9 - 10.3 mg/dL   Total Protein 6.6 6.5 - 8.1 g/dL   Albumin 3.0 (L) 3.5 - 5.0 g/dL   AST 13 (L) 15 - 41 U/L   ALT 7 (L) 14 - 54 U/L   Alkaline Phosphatase 77 38 - 126 U/L   Total Bilirubin 0.4 0.3 - 1.2 mg/dL   GFR calc non Af Amer >60 >60 mL/min   GFR calc Af Amer >60 >60 mL/min   Anion gap 6 5 - 15  Magnesium     Status: None   Collection Time: 07/01/16 10:56 AM  Result Value Ref Range   Magnesium 1.7 1.7 - 2.4 mg/dL  Urinalysis, Routine w reflex microscopic (not at [facility])  Status: Abnormal   Collection Time: 07/01/16 11:48 AM  Result Value Ref Range   Color, Urine YELLOW YELLOW   APPearance CLEAR CLEAR   Specific Gravity, Urine 1.015 1.005 - 1.030   pH 7.5 5.0 - 8.0   Glucose, UA NEGATIVE NEGATIVE mg/dL   Hgb urine dipstick NEGATIVE NEGATIVE   Bilirubin Urine NEGATIVE NEGATIVE   Ketones, ur 40 (A) NEGATIVE mg/dL   Protein, ur NEGATIVE NEGATIVE mg/dL   Nitrite NEGATIVE NEGATIVE   Leukocytes, UA NEGATIVE NEGATIVE    MDM Category 1 fetal tracing; no contractions O2 sat 100% & VSS CBC, neb tx, & EKG EKG - normal sinus rhythm with prolonged QT (QTc = 483) -- EKG reviewed by Dr. Genevie Ann CMP & Mag added to labs Pt reports marked improvement of symptoms s/p breathing tx Potassium & magnesium WNL Discussed pt with Dr. Debroah Loop -- agrees with plan to discharge home with inhaler & f/u in [facility] Assessment and Plan  A: 1. Mild intermittent asthma without complication   2. Prolonged Q-T interval on ECG    P: Discharge home Rx albuterol inhaler & montelukast Make f/u appt with [facility] asap Discussed reasons to return to MAU Increase water intake  Judeth Horn 07/01/2016, 10:40 AM

## 2016-07-01 NOTE — MAU Note (Signed)
Keep getting SOB, not always associated with activiity. Feels weak frequently.  Has been feeling abd /uterus tightening, ? Braxton hicks, sometimes it is painful.  No hx of PTL

## 2016-07-01 NOTE — Discharge Instructions (Signed)
Asthma, Adult Asthma is a recurring condition in which the airways tighten and narrow. Asthma can make it difficult to breathe. It can cause coughing, wheezing, and shortness of breath. Asthma episodes, also called asthma attacks, range from minor to life-threatening. Asthma cannot be cured, but medicines and lifestyle changes can help control it. CAUSES Asthma is believed to be caused by inherited (genetic) and environmental factors, but its exact cause is unknown. Asthma may be triggered by allergens, lung infections, or irritants in the air. Asthma triggers are different for each person. Common triggers include:   Animal dander.  Dust mites.  Cockroaches.  Pollen from trees or grass.  Mold.  Smoke.  Air pollutants such as dust, household cleaners, hair sprays, aerosol sprays, paint fumes, strong chemicals, or strong odors.  Cold air, weather changes, and winds (which increase molds and pollens in the air).  Strong emotional expressions such as crying or laughing hard.  Stress.  Certain medicines (such as aspirin) or types of drugs (such as beta-blockers).  Sulfites in foods and drinks. Foods and drinks that may contain sulfites include dried fruit, potato chips, and sparkling grape juice.  Infections or inflammatory conditions such as the flu, a cold, or an inflammation of the nasal membranes (rhinitis).  Gastroesophageal reflux disease (GERD).  Exercise or strenuous activity. SYMPTOMS Symptoms may occur immediately after asthma is triggered or many hours later. Symptoms include:  Wheezing.  Excessive nighttime or early morning coughing.  Frequent or severe coughing with a common cold.  Chest tightness.  Shortness of breath. DIAGNOSIS  The diagnosis of asthma is made by a review of your medical history and a physical exam. Tests may also be performed. These may include:  Lung function studies. These tests show how much air you breathe in and out.  Allergy  tests.  Imaging tests such as X-rays. TREATMENT  Asthma cannot be cured, but it can usually be controlled. Treatment involves identifying and avoiding your asthma triggers. It also involves medicines. There are 2 classes of medicine used for asthma treatment:   Controller medicines. These prevent asthma symptoms from occurring. They are usually taken every day.  Reliever or rescue medicines. These quickly relieve asthma symptoms. They are used as needed and provide short-term relief. Your health care provider will help you create an asthma action plan. An asthma action plan is a written plan for managing and treating your asthma attacks. It includes a list of your asthma triggers and how they may be avoided. It also includes information on when medicines should be taken and when their dosage should be changed. An action plan may also involve the use of a device called a peak flow meter. A peak flow meter measures how well the lungs are working. It helps you monitor your condition. HOME CARE INSTRUCTIONS   Take medicines only as directed by your health care provider. Speak with your health care provider if you have questions about how or when to take the medicines.  Use a peak flow meter as directed by your health care provider. Record and keep track of readings.  Understand and use the action plan to help minimize or stop an asthma attack without needing to seek medical care.  Control your home environment in the following ways to help prevent asthma attacks:  Do not smoke. Avoid being exposed to secondhand smoke.  Change your heating and air conditioning filter regularly.  Limit your use of fireplaces and wood stoves.  Get rid of pests (such as roaches   and mice) and their droppings.  Throw away plants if you see mold on them.  Clean your floors and dust regularly. Use unscented cleaning products.  Try to have someone else vacuum for you regularly. Stay out of rooms while they are  being vacuumed and for a short while afterward. If you vacuum, use a dust mask from a hardware store, a double-layered or microfilter vacuum cleaner bag, or a vacuum cleaner with a HEPA filter.  Replace carpet with wood, tile, or vinyl flooring. Carpet can trap dander and dust.  Use allergy-proof pillows, mattress covers, and box spring covers.  Wash bed sheets and blankets every week in hot water and dry them in a dryer.  Use blankets that are made of polyester or cotton.  Clean bathrooms and kitchens with bleach. If possible, have someone repaint the walls in these rooms with mold-resistant paint. Keep out of the rooms that are being cleaned and painted.  Wash hands frequently. SEEK MEDICAL CARE IF:   You have wheezing, shortness of breath, or a cough even if taking medicine to prevent attacks.  The colored mucus you cough up (sputum) is thicker than usual.  Your sputum changes from clear or white to yellow, green, gray, or bloody.  You have any problems that may be related to the medicines you are taking (such as a rash, itching, swelling, or trouble breathing).  You are using a reliever medicine more than 2-3 times per week.  Your peak flow is still at 50-79% of your personal best after following your action plan for 1 hour.  You have a fever. SEEK IMMEDIATE MEDICAL CARE IF:   You seem to be getting worse and are unresponsive to treatment during an asthma attack.  You are short of breath even at rest.  You get short of breath when doing very little physical activity.  You have difficulty eating, drinking, or talking due to asthma symptoms.  You develop chest pain.  You develop a fast heartbeat.  You have a bluish color to your lips or fingernails.  You are light-headed, dizzy, or faint.  Your peak flow is less than 50% of your personal best.   This information is not intended to replace advice given to you by your health care provider. Make sure you discuss any  questions you have with your health care provider.   Document Released: 09/05/2005 Document Revised: 05/27/2015 Document Reviewed: 04/04/2013 Elsevier Interactive Patient Education 2016 [employer]  

## 2016-07-08 ENCOUNTER — Encounter: Payer: Medicaid Other | Admitting: Family Medicine

## 2016-07-12 ENCOUNTER — Ambulatory Visit (INDEPENDENT_AMBULATORY_CARE_PROVIDER_SITE_OTHER): Payer: Medicaid Other | Admitting: Obstetrics and Gynecology

## 2016-07-12 VITALS — BP 128/62 | HR 97 | Wt 194.0 lb

## 2016-07-12 DIAGNOSIS — O26893 Other specified pregnancy related conditions, third trimester: Secondary | ICD-10-CM

## 2016-07-12 DIAGNOSIS — Z6791 Unspecified blood type, Rh negative: Secondary | ICD-10-CM

## 2016-07-12 DIAGNOSIS — Z3480 Encounter for supervision of other normal pregnancy, unspecified trimester: Secondary | ICD-10-CM

## 2016-07-12 DIAGNOSIS — O26899 Other specified pregnancy related conditions, unspecified trimester: Secondary | ICD-10-CM

## 2016-07-12 DIAGNOSIS — O360930 Maternal care for other rhesus isoimmunization, third trimester, not applicable or unspecified: Secondary | ICD-10-CM

## 2016-07-12 LAB — CBC
HCT: 30.1 % — ABNORMAL LOW (ref 35.0–45.0)
Hemoglobin: 9.7 g/dL — ABNORMAL LOW (ref 11.7–15.5)
MCH: 25.2 pg — AB (ref 27.0–33.0)
MCHC: 32.2 g/dL (ref 32.0–36.0)
MCV: 78.2 fL — ABNORMAL LOW (ref 80.0–100.0)
MPV: 9 fL (ref 7.5–12.5)
PLATELETS: 237 10*3/uL (ref 140–400)
RBC: 3.85 MIL/uL (ref 3.80–5.10)
RDW: 14.1 % (ref 11.0–15.0)
WBC: 7.3 10*3/uL (ref 3.8–10.8)

## 2016-07-12 MED ORDER — ASPIRIN EC 81 MG PO TBEC: 81.0000 mg | DELAYED_RELEASE_TABLET | Freq: Every day | ORAL | 3 refills | Status: DC

## 2016-07-12 MED ORDER — RHO D IMMUNE GLOBULIN 1500 UNIT/2ML IJ SOSY
300.0000 ug | PREFILLED_SYRINGE | Freq: Once | INTRAMUSCULAR | Status: AC
  Administered 2016-07-12: 300 ug via INTRAMUSCULAR

## 2016-07-12 NOTE — Addendum Note (Signed)
Addended by: Sherre LainASH, Tamiko Leopard A on: 07/12/2016 02:46 PM   Modules accepted: Orders

## 2016-07-12 NOTE — Patient Instructions (Signed)
Postpartum Tubal Ligation Postpartum tubal ligation (PPTL) is a procedure that closes the fallopian tubes right after childbirth or 1-2 days after childbirth. PPTL is done before the uterus returns to its normal location. The procedure is also called a mini-laparotomy. When the fallopian tubes are closed, the eggs that are released from the ovaries cannot enter the uterus, and sperm cannot reach the egg. PPTL is done so you will not be able to get pregnant or have a baby. Although this procedure may be undone (reversed), it should be considered permanent and irreversible. If you want to have future pregnancies, you should not have this procedure. LET YOUR HEALTH CARE PROVIDER KNOW ABOUT:  Any allergies you have.  All medicines you are taking, including vitamins, herbs, eye drops, creams, and over-the-counter medicines. This includes any use of steroids, either by mouth or in cream form.  Previous problems you or members of your family have had with the use of anesthetics.  Any blood disorders you have.  Previous surgeries you have had.  Any medical conditions you may have. RISKS AND COMPLICATIONS  Infection.  Bleeding.  Injury to surrounding organs.  Side effects from anesthetics.  Failure of the procedure.  Ectopic pregnancy.  Future regret about having the procedure done. BEFORE THE PROCEDURE  You may need to sign certain documents, including an informed consent form, up to 30 days before the date of your tubal ligation.  Follow instructions from your health care provider about eating and drinking restrictions. PROCEDURE  If done 1-2 days after a vaginal delivery:  You will be given one or more of the following:  A medicine that helps you relax (sedative).  A medicine that numbs the area (local anesthetic).  A medicine that makes you fall asleep (general anesthetic).  A medicine that is injected into an area of your body that numbs everything below the injection site  (regional anesthetic).  If you have been given general anesthetic, a tube will be put down your throat to help you breathe.  Your bladder may be emptied with a small tube (catheter).  A small cut (incision) will be made just above the pubic hair line.  The fallopian tubes will be located and brought up through the incision.  The fallopian tubes will be tied off or burned (cauterized), or they will be closed with a clamp, ring, or clip. In many cases, a small portion in the center of each fallopian tube will also be removed.  The incision will be closed with stitches (sutures).  A bandage (dressing) will be placed over the incision. The procedure may vary among health care providers and hospitals. If done after a cesarean delivery:  Tubal ligation will be done through the incision that was used for the cesarean delivery of your baby.  After the tubes are closed, the incision will be closed with stitches (sutures).  A bandage (dressing) will be placed over the incision. The procedure may vary among health care providers and hospitals. AFTER THE PROCEDURE  Your blood pressure, heart rate, breathing rate, and blood oxygen level will be monitored often until the medicines you were given have worn off.  You will be given pain medicine as needed.  If you had general anesthetic, you may have some mild discomfort in your throat. This is from the breathing tube that was placed in your throat while you were sleeping.  You may feel tired, and you should rest for the remainder of the day.  You may have some pain   or cramps in the abdominal area for 3-7 days.   This information is not intended to replace advice given to you by your health care provider. Make sure you discuss any questions you have with your health care provider.   Document Released: 09/05/2005 Document Revised: 01/20/2015 Document Reviewed: 12/17/2011 Elsevier Interactive Patient Education 2016 [employer]  

## 2016-07-12 NOTE — Progress Notes (Signed)
28 week labs today. Due at 14:50. Declines flu and tdap today. Will get rhophylac.

## 2016-07-12 NOTE — Progress Notes (Signed)
   PRENATAL VISIT NOTE  Subjective:  Mary Mitchell is a 20 y.o. G6P5005 at 5255w0d being seen today for ongoing prenatal care.  She is currently monitored for the following issues for this high-risk pregnancy and has Mild intermittent asthma; Tobacco abuse; Rh negative state in antepartum period; Encounter for supervision of normal pregnancy in multigravida; and History of marijuana use on her problem list.  Patient reports no complaints.  Contractions: Not present. Vag. Bleeding: None.  Movement: Present. Denies leaking of fluid.   The following portions of the patient's history were reviewed and updated as appropriate: allergies, current medications, past family history, past medical history, past social history, past surgical history and problem list. Problem list updated.  Objective:   Vitals:   07/12/16 1350  BP: 128/62  Pulse: 97  Weight: 194 lb (88 kg)    Fetal Status: Fetal Heart Rate (bpm): 135 Fundal Height: 31 cm Movement: Present     General:  Alert, oriented and cooperative. Patient is in no acute distress.  Skin: Skin is warm and dry. No rash noted.   Cardiovascular: Normal heart rate noted  Respiratory: Normal respiratory effort, no problems with respiration noted  Abdomen: Soft, gravid, appropriate for gestational age. Pain/Pressure: Absent     Pelvic:  Cervical exam deferred        Extremities: Normal range of motion.  Edema: None  Mental Status: Normal mood and affect. Normal behavior. Normal judgment and thought content.   Assessment and Plan:  Pregnancy: G6P5005 at 5555w0d    1. Rh negative state in antepartum period Rhogam today 2. Encounter for supervision of normal pregnancy in multigravida Pt has not been seen since August -discussed flu and pertussis vaccination, pt considering pertussis, ask at next visit. Discussed BTL, concerned about having surgery considering. Pt with a history of gHTN, recommend baby ASA,. - Glucose Tolerance, 1 HR (50g)  w/o Fasting - CBC - RPR - HIV antibody (with reflex) - rho (d) immune globulin (RHIG/RHOPHYLAC) injection 300 mcg; Inject 2 mLs (300 mcg total) into the muscle once.  Preterm labor symptoms and general obstetric precautions including but not limited to vaginal bleeding, contractions, leaking of fluid and fetal movement were reviewed in detail with the patient. Please refer to After Visit Summary for other counseling recommendations.  Return in about 2 weeks (around 07/26/2016) for HROB.  Lorne SkeensNicholas Michael Montrail Mehrer, MD

## 2016-07-13 LAB — HIV ANTIBODY (ROUTINE TESTING W REFLEX): HIV: NONREACTIVE

## 2016-07-13 LAB — GLUCOSE TOLERANCE, 1 HOUR (50G) W/O FASTING: GLUCOSE, 1 HR, GESTATIONAL: 90 mg/dL (ref <140)

## 2016-07-14 LAB — RPR

## 2016-07-18 ENCOUNTER — Ambulatory Visit (HOSPITAL_COMMUNITY)
Admission: RE | Admit: 2016-07-18 | Discharge: 2016-07-18 | Disposition: A | Discharge status: Home / Self Care | Payer: Medicaid Other | Source: Ambulatory Visit | Attending: Obstetrics and Gynecology | Admitting: Obstetrics and Gynecology

## 2016-07-18 ENCOUNTER — Telehealth: Payer: Self-pay

## 2016-07-18 DIAGNOSIS — Z3480 Encounter for supervision of other normal pregnancy, unspecified trimester: Secondary | ICD-10-CM

## 2016-07-18 DIAGNOSIS — O99323 Drug use complicating pregnancy, third trimester: Secondary | ICD-10-CM | POA: Insufficient documentation

## 2016-07-18 DIAGNOSIS — O09293 Supervision of pregnancy with other poor reproductive or obstetric history, third trimester: Secondary | ICD-10-CM | POA: Diagnosis not present

## 2016-07-18 DIAGNOSIS — Z3A29 29 weeks gestation of pregnancy: Secondary | ICD-10-CM | POA: Diagnosis not present

## 2016-07-18 NOTE — Telephone Encounter (Signed)
  Called patient- no answer or voicemail to leave a message Pt needs to be started on iron supplementation. Please start ferrous sulfate 325mg  BID

## 2016-07-20 ENCOUNTER — Telehealth: Payer: Self-pay | Admitting: *Deleted

## 2016-07-20 MED ORDER — FERROUS SULFATE 325 (65 FE) MG PO TABS: 325.0000 mg | ORAL_TABLET | Freq: Two times a day (BID) | ORAL | 3 refills | Status: DC

## 2016-07-20 MED ORDER — DOCUSATE SODIUM 100 MG PO CAPS: 100.0000 mg | ORAL_CAPSULE | Freq: Two times a day (BID) | ORAL | 1 refills | Status: DC | PRN

## 2016-07-20 NOTE — Telephone Encounter (Signed)
Called pt and informed her of prescription sent to pharmacy for iron supplement due to anemia. Pt may also experience constipation therefore Rx sent for Colace as well. Pt stated that she has been having sharp pain in vagina. I told her that this can be normal due to the position of the baby and possible pressure on nerves in the pelvis. She should use comfort measures, use of pillows and change her position when this happens. Pt voiced understanding of all information given.

## 2016-07-22 NOTE — Telephone Encounter (Signed)
Called patient, no answer. Was able to leave voicemail stating I am calling with test results, please call back at the clinic.

## 2016-07-28 ENCOUNTER — Encounter: Payer: Medicaid Other | Admitting: Obstetrics and Gynecology

## 2016-07-28 ENCOUNTER — Encounter: Payer: Medicaid Other | Admitting: Obstetrics & Gynecology

## 2016-07-29 ENCOUNTER — Ambulatory Visit (INDEPENDENT_AMBULATORY_CARE_PROVIDER_SITE_OTHER): Payer: Medicaid Other | Admitting: Family Medicine

## 2016-07-29 VITALS — BP 121/65 | HR 94 | Wt 196.1 lb

## 2016-07-29 DIAGNOSIS — Z3480 Encounter for supervision of other normal pregnancy, unspecified trimester: Secondary | ICD-10-CM

## 2016-07-29 DIAGNOSIS — Z3483 Encounter for supervision of other normal pregnancy, third trimester: Secondary | ICD-10-CM

## 2016-07-29 MED ORDER — FERROUS GLUCONATE 324 (38 FE) MG PO TABS: 324.0000 mg | ORAL_TABLET | Freq: Two times a day (BID) | ORAL | 3 refills | Status: DC

## 2016-07-29 NOTE — Progress Notes (Signed)
   PRENATAL VISIT NOTE  Subjective:  Mary Mitchell is a 20 y.o. G6P5005 at 6578w3d being seen today for ongoing prenatal care.  She is currently monitored for the following issues for this low-risk pregnancy and has Mild intermittent asthma; Tobacco abuse; Rh negative state in antepartum period; Encounter for supervision of normal pregnancy in multigravida; and History of marijuana use on her problem list.  Patient reports occasional contractions.  Contractions: Not present. Vag. Bleeding: None.  Movement: Present. Denies leaking of fluid.   The following portions of the patient's history were reviewed and updated as appropriate: allergies, current medications, past family history, past medical history, past social history, past surgical history and problem list. Problem list updated.  Objective:   Vitals:   07/29/16 0959  BP: 121/65  Pulse: 94  Weight: 196 lb 1.6 oz (89 kg)    Fetal Status: Fetal Heart Rate (bpm): 130   Movement: Present     General:  Alert, oriented and cooperative. Patient is in no acute distress.  Skin: Skin is warm and dry. No rash noted.   Cardiovascular: Normal heart rate noted  Respiratory: Normal respiratory effort, no problems with respiration noted  Abdomen: Soft, gravid, appropriate for gestational age. Pain/Pressure: Present     Pelvic:  Cervical exam deferred        Extremities: Normal range of motion.  Edema: Trace  Mental Status: Normal mood and affect. Normal behavior. Normal judgment and thought content.   Assessment and Plan:  Pregnancy: G6P5005 at 5078w3d  1. Encounter for supervision of normal pregnancy in multigravida FHT and FH normal. Not tolerating ferrous sulfate. Will give ferrous gluconate.  Preterm labor symptoms and general obstetric precautions including but not limited to vaginal bleeding, contractions, leaking of fluid and fetal movement were reviewed in detail with the patient. Please refer to After Visit Summary for other  counseling recommendations.  No Follow-up on file.   Levie HeritageJacob J Erlinda Solinger, DO

## 2016-08-16 ENCOUNTER — Emergency Department (HOSPITAL_COMMUNITY): Payer: Medicaid Other

## 2016-08-16 ENCOUNTER — Emergency Department (HOSPITAL_COMMUNITY)
Admission: EM | Admit: 2016-08-16 | Discharge: 2016-08-16 | Disposition: A | Discharge status: Home / Self Care | Payer: Medicaid Other | Attending: Emergency Medicine | Admitting: Emergency Medicine

## 2016-08-16 ENCOUNTER — Encounter (HOSPITAL_COMMUNITY): Payer: Self-pay | Admitting: *Deleted

## 2016-08-16 DIAGNOSIS — S93401A Sprain of unspecified ligament of right ankle, initial encounter: Secondary | ICD-10-CM

## 2016-08-16 DIAGNOSIS — J45909 Unspecified asthma, uncomplicated: Secondary | ICD-10-CM | POA: Insufficient documentation

## 2016-08-16 DIAGNOSIS — X501XXA Overexertion from prolonged static or awkward postures, initial encounter: Secondary | ICD-10-CM | POA: Insufficient documentation

## 2016-08-16 DIAGNOSIS — F17210 Nicotine dependence, cigarettes, uncomplicated: Secondary | ICD-10-CM | POA: Insufficient documentation

## 2016-08-16 DIAGNOSIS — O9A213 Injury, poisoning and certain other consequences of external causes complicating pregnancy, third trimester: Secondary | ICD-10-CM | POA: Diagnosis not present

## 2016-08-16 DIAGNOSIS — Y9301 Activity, walking, marching and hiking: Secondary | ICD-10-CM | POA: Insufficient documentation

## 2016-08-16 DIAGNOSIS — O99333 Smoking (tobacco) complicating pregnancy, third trimester: Secondary | ICD-10-CM | POA: Diagnosis not present

## 2016-08-16 DIAGNOSIS — Z3A35 35 weeks gestation of pregnancy: Secondary | ICD-10-CM | POA: Insufficient documentation

## 2016-08-16 DIAGNOSIS — F909 Attention-deficit hyperactivity disorder, unspecified type: Secondary | ICD-10-CM | POA: Insufficient documentation

## 2016-08-16 DIAGNOSIS — Y999 Unspecified external cause status: Secondary | ICD-10-CM | POA: Diagnosis not present

## 2016-08-16 DIAGNOSIS — Y92480 Sidewalk as the place of occurrence of the external cause: Secondary | ICD-10-CM | POA: Diagnosis not present

## 2016-08-16 DIAGNOSIS — Z7982 Long term (current) use of aspirin: Secondary | ICD-10-CM | POA: Insufficient documentation

## 2016-08-16 MED ORDER — LACTATED RINGERS IV BOLUS (SEPSIS)
1000.0000 mL | Freq: Once | INTRAVENOUS | Status: AC
  Administered 2016-08-16: 1000 mL via INTRAVENOUS

## 2016-08-16 MED ORDER — MORPHINE SULFATE (PF) 4 MG/ML IV SOLN
4.0000 mg | Freq: Once | INTRAVENOUS | Status: AC
  Administered 2016-08-16: 4 mg via INTRAVENOUS
  Filled 2016-08-16: qty 1

## 2016-08-16 MED ORDER — ONDANSETRON HCL 4 MG/2ML IJ SOLN
4.0000 mg | Freq: Once | INTRAMUSCULAR | Status: AC
  Administered 2016-08-16: 4 mg via INTRAVENOUS
  Filled 2016-08-16: qty 2

## 2016-08-16 NOTE — ED Notes (Signed)
Ortho tech at bedside 

## 2016-08-16 NOTE — Progress Notes (Signed)
Orthopedic Tech Progress Note Patient Details:  Mary Mitchell 1996-07-15 [id number]  Ortho Devices Type of Ortho Device: ASO, Crutches Ortho Device/Splint Location: RLE Ortho Device/Splint Interventions: Ordered, Application   Jennye MoccasinHughes, Negin Hegg Craig 08/16/2016, 9:42 PM

## 2016-08-16 NOTE — Progress Notes (Signed)
FHT 130bpm, reactive, no decelerations (Cat 1). Pt was having some mild contractions on arrival. Diminished to occasional mild UC with some irritability after LR, 1 liter bolus. Dr. Debroah LoopArnold notified, advised of patient status. OB cleared for discharge.

## 2016-08-16 NOTE — ED Notes (Signed)
OB RN at bedside

## 2016-08-16 NOTE — Progress Notes (Signed)
1905  Arrived to evaluate this 20 yo G6P5 @ [redacted] wks GA in with report of fall.  Pt can not say what part she landed on or if she struck her abdomen.  She complains of left leg and ankle pain.  Denies vaginal bleeding or LOF and reports good fetal movement. 1920  FHR Category I, mild UC's noted. Needs evaluation by EDP.

## 2016-08-16 NOTE — Discharge Instructions (Signed)
Follow up with orthopedics if symptoms do not resolve. Take tylenol as needed for pain. Apply ice to affected area. Elevate your ankle as much as possible. I also recommend follow up with OBGYN for contractions. Return to the ED if you experience severe worsening of your symptoms, increased swelling, chest pain, difficulty breathing, vaginal bleeding, abdominal pain.

## 2016-08-16 NOTE — ED Provider Notes (Signed)
[facility]-EMERGENCY DEPT Provider Note   CSN: [id number] Arrival date & time: 08/16/16  1845     History   Chief Complaint Chief Complaint  Patient presents with  . Fall    HPI Mary Mitchell is a 20 y.o. female who is currently [redacted] weeks pregnant presents to the ED today to be evaluated after a mechanical fall. Pt states that she stepped wrong off of a curb and roller her right ankle causing her to fall on her R hip/knee. Pt has not attempted to ambulate since the injury. She is not sure if she landed on her abdomen or not. She denies any abdominal pain but states that she is having intermittent pressure in her vaginal area. Pain is located in her R hip down to her R ankle.   HPI  Past Medical History:  Diagnosis Date  . ADHD (attention deficit hyperactivity disorder)   . Asthma   . Bipolar 1 disorder (HCC)   . PTSD (post-traumatic stress disorder)     Patient Active Problem List   Diagnosis Date Noted  . History of marijuana use 03/21/2016  . Encounter for supervision of normal pregnancy in multigravida 04/01/2015  . Rh negative state in antepartum period 01/31/2014  . Mild intermittent asthma 01/30/2014  . Tobacco abuse 01/30/2014    Past Surgical History:  Procedure Laterality Date  . ABDOMINAL HERNIA REPAIR    . HERNIA REPAIR      OB History    Gravida Para Term Preterm AB Living   6 5 5  0 0 5   SAB TAB Ectopic Multiple Live Births   0 0 0 0 5       Home Medications    Prior to Admission medications   Medication Sig Start Date End Date Taking? Authorizing Provider  acetaminophen (TYLENOL) 500 MG tablet Take 500 mg by mouth every 6 (six) hours as needed for headache.    Historical Provider, MD  albuterol (PROVENTIL HFA;VENTOLIN HFA) 108 (90 Base) MCG/ACT inhaler Inhale 1-2 puffs into the lungs every 6 (six) hours as needed for wheezing or shortness of breath. Patient not taking: Reported on 07/29/2016 07/01/16   [name], NP  aspirin EC 81 MG  tablet Take 1 tablet (81 mg total) by mouth daily. Patient not taking: Reported on 07/29/2016 07/12/16   [name], MD  ferrous gluconate (FERGON) 324 MG tablet Take 1 tablet (324 mg total) by mouth 2 (two) times daily with a meal. 07/29/16   [name], DO  montelukast (SINGULAIR) 10 MG tablet Take 1 tablet (10 mg total) by mouth at bedtime. Patient not taking: Reported on 07/29/2016 07/01/16   [name], NP  Prenatal Multivit-Min-Fe-FA (PRENATAL VITAMINS) 0.8 MG tablet Take 1 tablet by mouth daily. Patient not taking: Reported on 07/29/2016 03/10/16   [name], MD    Family History Family History  Problem Relation Age of Onset  . Diabetes Mother   . Hypertension Mother   . Diabetes Maternal Grandmother   . Hypertension Maternal Grandmother   . Heart disease Maternal Grandmother   . Cystic fibrosis Daughter     Social History Social History  Substance Use Topics  . Smoking status: Light Tobacco Smoker    Packs/day: 0.25    Types: Cigarettes  . Smokeless tobacco: Never Used  . Alcohol use No     Allergies   Patient has no known allergies.   Review of Systems Review of Systems  All other systems reviewed and are  negative.    Physical Exam Updated Vital Signs BP 108/64 (BP Location: Left Arm)   Pulse 114   Temp 98.3 F (36.8 C) (Oral)   Resp 20   Ht 5\' 2"  (1.575 m)   Wt 88.9 kg   LMP 10/21/2015 (Exact Date)   BMI 35.85 kg/m   Physical Exam  Constitutional: She is oriented to person, place, and time. She appears well-developed and well-nourished. No distress.  HENT:  Head: Normocephalic and atraumatic.  Mouth/Throat: No oropharyngeal exudate.  Eyes: Conjunctivae and EOM are normal. Pupils are equal, round, and reactive to light. Right eye exhibits no discharge. Left eye exhibits no discharge. No scleral icterus.  Cardiovascular: Normal rate and intact distal pulses.   Abdominal: Soft. She exhibits no distension. There is no  tenderness. There is no guarding.  Gravid uterus, normal fetal heart rate  Musculoskeletal: Normal range of motion. She exhibits tenderness. She exhibits no edema.  TTP over R lateral malleolus without bony deformity or ecchymosis. Decrease ROM of R ankle and R knee limited by pain. Negative anterior/poster drawer bilaterally. Negative ballottement test. No varus or valgus laxity. No crepitus.   Neurological: She is alert and oriented to person, place, and time.  Skin: Skin is warm and dry. No rash noted. She is not diaphoretic. No erythema. No pallor.  Psychiatric: She has a normal mood and affect. Her behavior is normal.  Nursing note and vitals reviewed.    ED Treatments / Results  Labs (all labs ordered are listed, but only abnormal results are displayed) Labs Reviewed - No data to display  EKG  EKG Interpretation None       Radiology No results found.  Procedures Procedures (including critical care time)  Medications Ordered in ED Medications  morphine 4 MG/ML injection 4 mg (not administered)  ondansetron (ZOFRAN) injection 4 mg (not administered)     Initial Impression / Assessment and Plan / ED Course  I have reviewed the triage vital signs and the nursing notes.  Pertinent labs & imaging results that were available during my care of the patient were reviewed by me and considered in my medical decision making (see chart for details).  Clinical Course     20 y.o F , currently [redacted] weeks pregnant presents to the ED to be evaluated after a mechanical fall. Pt missed a step off of the curb and roller her R ankle causing her to fall onto her R side. Pt appears very uncomfortable in ED, c/o in R knee and ankle. Decrease ROM limited by pain. No abd pain or bruising. PT unsure if she fell onto abdomen. Rapid response OB evaluated pt on fetal monitoring for > 1 hour. Small amount of contractions noted and uterine irritability. Normal fetal HR and movement. Per OBGYN request,  pt was given 1 L of fluids with symptomatic relief. Rapid response OB re-evaluated pt after fluids and re-consulted OBGYN who have now cleared her from an OB standpoint. Pt was given pain medication in ED. Xray R knee and ankle unremarkable. Likely ankle sprain. Pt was given ankle brace in Ed and is now ambulatory without difficulty. Recommend f/u with OBGYN and ortho for re-evaluation. Return precautions outlined in patient discharge instructions.   Case discussed with Dr. Fayrene FearingJames who agrees with treatment plan. Final Clinical Impressions(s) / ED Diagnoses   Final diagnoses:  Sprain of right ankle, unspecified ligament, initial encounter    New Prescriptions New Prescriptions   No medications on file  Lester Kinsman [place], PA-C 08/19/16 1610    Rolland Porter, MD 08/30/16 0030

## 2016-08-16 NOTE — ED Triage Notes (Addendum)
Pt arrived via EMS after rolling her ankle while walking down sidewalk. Pt denies LOC or dizziness. Pt states she was just "walking and stepped wrong off of curb". No gross deformity noted to right ankle. Pt is [redacted] wks pregnant. Pt unsure if she fell on her abdomen. Pt c/o right leg/hip pain. VSS upon arrival. Pt is a G5P6.

## 2016-08-16 NOTE — ED Notes (Signed)
Ambulated pt to restroom. Pt stated she couldn't feel much of anything. Pt was unable to ambulate to restroom self efficiently. Pt needed moderate assistance.

## 2016-08-18 ENCOUNTER — Encounter: Payer: Self-pay | Admitting: Obstetrics and Gynecology

## 2016-08-18 ENCOUNTER — Ambulatory Visit (INDEPENDENT_AMBULATORY_CARE_PROVIDER_SITE_OTHER): Payer: Medicaid Other | Admitting: Obstetrics and Gynecology

## 2016-08-18 VITALS — BP 130/71 | HR 97

## 2016-08-18 DIAGNOSIS — Z6791 Unspecified blood type, Rh negative: Secondary | ICD-10-CM

## 2016-08-18 DIAGNOSIS — Z3483 Encounter for supervision of other normal pregnancy, third trimester: Secondary | ICD-10-CM

## 2016-08-18 DIAGNOSIS — O09893 Supervision of other high risk pregnancies, third trimester: Secondary | ICD-10-CM | POA: Insufficient documentation

## 2016-08-18 DIAGNOSIS — O09293 Supervision of pregnancy with other poor reproductive or obstetric history, third trimester: Secondary | ICD-10-CM | POA: Insufficient documentation

## 2016-08-18 DIAGNOSIS — Z3480 Encounter for supervision of other normal pregnancy, unspecified trimester: Secondary | ICD-10-CM

## 2016-08-18 DIAGNOSIS — Z641 Problems related to multiparity: Secondary | ICD-10-CM

## 2016-08-18 DIAGNOSIS — O26899 Other specified pregnancy related conditions, unspecified trimester: Secondary | ICD-10-CM

## 2016-08-18 NOTE — Progress Notes (Signed)
Prenatal Visit Note Date: 08/18/2016 Clinic: [facility]  Subjective:  Mary Mitchell is a 20 y.o. [id number] at 7527w2d being seen today for ongoing prenatal care.  She is currently monitored for the following issues for this high-risk pregnancy and has Mild intermittent asthma; Tobacco abuse; Rh negative state in antepartum period; Encounter for supervision of normal pregnancy in multigravida; History of marijuana use; Grand multiparity; Short interval between pregnancies affecting pregnancy in third trimester, antepartum; and History of shoulder dystocia in prior pregnancy, currently pregnant in third trimester on her problem list.  Patient reports no complaints.   Contractions: Not present. Vag. Bleeding: None.  Movement: Present. Denies leaking of fluid.   The following portions of the patient's history were reviewed and updated as appropriate: allergies, current medications, past family history, past medical history, past social history, past surgical history and problem list. Problem list updated.  Objective:   Vitals:   08/18/16 1128  BP: 130/71  Pulse: 97    Fetal Status: Fetal Heart Rate (bpm): 132 Fundal Height: 34 cm Movement: Present  Presentation: Vertex  General:  Alert, oriented and cooperative. Patient is in no acute distress.  Skin: Skin is warm and dry. No rash noted.   Cardiovascular: Normal heart rate noted  Respiratory: Normal respiratory effort, no problems with respiration noted  Abdomen: Soft, gravid, appropriate for gestational age. Pain/Pressure: Present     Pelvic:  Cervical exam deferred        Extremities: Normal range of motion.  Edema: None  Mental Status: Normal mood and affect. Normal behavior. Normal judgment and thought content.   Urinalysis:      Assessment and Plan:  Pregnancy: [id number] at 1427w2d  Routine care. GBS nv. Not interested in BTL. LARC options stressed with patient.   Preterm labor symptoms and general  obstetric precautions including but not limited to vaginal bleeding, contractions, leaking of fluid and fetal movement were reviewed in detail with the patient. Please refer to After Visit Summary for other counseling recommendations.  Return in about 2 weeks (around 09/01/2016).   Bucyrus Bingharlie Kaysey Berndt, MD

## 2016-08-21 ENCOUNTER — Encounter (HOSPITAL_COMMUNITY): Payer: Self-pay | Admitting: *Deleted

## 2016-08-21 ENCOUNTER — Inpatient Hospital Stay (HOSPITAL_COMMUNITY)
Admission: AD | Admit: 2016-08-21 | Discharge: 2016-08-21 | Disposition: A | Discharge status: Home / Self Care | Payer: Medicaid Other | Source: Ambulatory Visit | Attending: Obstetrics & Gynecology | Admitting: Obstetrics & Gynecology

## 2016-08-21 DIAGNOSIS — O99333 Smoking (tobacco) complicating pregnancy, third trimester: Secondary | ICD-10-CM | POA: Insufficient documentation

## 2016-08-21 DIAGNOSIS — O4703 False labor before 37 completed weeks of gestation, third trimester: Secondary | ICD-10-CM

## 2016-08-21 DIAGNOSIS — O99513 Diseases of the respiratory system complicating pregnancy, third trimester: Secondary | ICD-10-CM | POA: Insufficient documentation

## 2016-08-21 DIAGNOSIS — Z3A34 34 weeks gestation of pregnancy: Secondary | ICD-10-CM | POA: Diagnosis not present

## 2016-08-21 DIAGNOSIS — F17210 Nicotine dependence, cigarettes, uncomplicated: Secondary | ICD-10-CM | POA: Insufficient documentation

## 2016-08-21 DIAGNOSIS — R109 Unspecified abdominal pain: Secondary | ICD-10-CM | POA: Diagnosis present

## 2016-08-21 DIAGNOSIS — O133 Gestational [pregnancy-induced] hypertension without significant proteinuria, third trimester: Secondary | ICD-10-CM | POA: Diagnosis not present

## 2016-08-21 DIAGNOSIS — J45909 Unspecified asthma, uncomplicated: Secondary | ICD-10-CM | POA: Insufficient documentation

## 2016-08-21 DIAGNOSIS — Z7982 Long term (current) use of aspirin: Secondary | ICD-10-CM | POA: Insufficient documentation

## 2016-08-21 LAB — COMPREHENSIVE METABOLIC PANEL
ALBUMIN: 3 g/dL — AB (ref 3.5–5.0)
ALK PHOS: 100 U/L (ref 38–126)
ALT: 9 U/L — AB (ref 14–54)
AST: 14 U/L — ABNORMAL LOW (ref 15–41)
Anion gap: 6 (ref 5–15)
BUN: 6 mg/dL (ref 6–20)
CALCIUM: 8.6 mg/dL — AB (ref 8.9–10.3)
CHLORIDE: 106 mmol/L (ref 101–111)
CO2: 24 mmol/L (ref 22–32)
CREATININE: 0.31 mg/dL — AB (ref 0.44–1.00)
GFR calc Af Amer: >60 mL/min (ref >60)
GFR calc non Af Amer: >60 mL/min (ref >60)
GLUCOSE: 78 mg/dL (ref 65–99)
Potassium: 3.4 mmol/L — ABNORMAL LOW (ref 3.5–5.1)
SODIUM: 136 mmol/L (ref 135–145)
Total Bilirubin: 0.2 mg/dL — ABNORMAL LOW (ref 0.3–1.2)
Total Protein: 6.3 g/dL — ABNORMAL LOW (ref 6.5–8.1)

## 2016-08-21 LAB — URINALYSIS, ROUTINE W REFLEX MICROSCOPIC
Bilirubin Urine: NEGATIVE
GLUCOSE, UA: NEGATIVE mg/dL
HGB URINE DIPSTICK: NEGATIVE
Ketones, ur: 15 mg/dL — AB
LEUKOCYTES UA: NEGATIVE
Nitrite: NEGATIVE
PH: 6.5 (ref 5.0–8.0)
Protein, ur: NEGATIVE mg/dL
SPECIFIC GRAVITY, URINE: 1.02 (ref 1.005–1.030)

## 2016-08-21 LAB — WET PREP, GENITAL
CLUE CELLS WET PREP: NONE SEEN
SPERM: NONE SEEN
Trich, Wet Prep: NONE SEEN
Yeast Wet Prep HPF POC: NONE SEEN

## 2016-08-21 LAB — CBC
HCT: 28.3 % — ABNORMAL LOW (ref 36.0–46.0)
HEMOGLOBIN: 9.3 g/dL — AB (ref 12.0–15.0)
MCH: 25.1 pg — ABNORMAL LOW (ref 26.0–34.0)
MCHC: 32.9 g/dL (ref 30.0–36.0)
MCV: 76.5 fL — ABNORMAL LOW (ref 78.0–100.0)
PLATELETS: 197 10*3/uL (ref 150–400)
RBC: 3.7 MIL/uL — AB (ref 3.87–5.11)
RDW: 14.4 % (ref 11.5–15.5)
WBC: 8.9 10*3/uL (ref 4.0–10.5)

## 2016-08-21 LAB — OB RESULTS CONSOLE GC/CHLAMYDIA: Gonorrhea: NEGATIVE

## 2016-08-21 LAB — PROTEIN / CREATININE RATIO, URINE
Creatinine, Urine: 176 mg/dL
PROTEIN CREATININE RATIO: 0.15 mg/mg{creat} (ref 0.00–0.15)
TOTAL PROTEIN, URINE: 27 mg/dL

## 2016-08-21 NOTE — MAU Note (Signed)
Pt states she is having aching pains in her lower back and sharp pain in the sides of her lower abdomen.  Pt states she feels lightheaded.  Pt states the baby was moving a little bit.

## 2016-08-21 NOTE — MAU Provider Note (Signed)
Chief Complaint:  No chief complaint on file.   HPI: Mary Mitchell is a 20 y.o. [phone] at 675w5d who presents to maternity admissions reporting achy pains in back and abdomen.  Started about 1 hour ago. Pain is described as fluctuating pain every 3 minutes, achy feeling in back but sharper feeling in the lower abdomen. Unsure if these are contractions, due to "every pregnancy was different." Denies N/V, CP/SOB, F/C. +diarrhea, described as black, smells like tar, watery not sticky, today only once, normal stooling to this point. Taking iron supplements. No hematochezia. No urinary symptoms of dysuria, urgency, frequency, incomplete voiding or hematuria.   Denies leakage of fluid or vaginal bleeding. Good fetal movement.    Past Medical History: Past Medical History:  Diagnosis Date  . ADHD (attention deficit hyperactivity disorder)   . Asthma   . Bipolar 1 disorder (HCC)   . PTSD (post-traumatic stress disorder)     Past obstetric history: OB History  Gravida Para Term Preterm AB Living  6 5 5  0 0 5  SAB TAB Ectopic Multiple Live Births  0 0 0 0 5    # Outcome Date GA Lbr Len/2nd Weight Sex Delivery Anes PTL Lv  6 Current           5 Term 04/14/15 7526w5d 06:39 / 00:02 7 lb 9.5 oz (3.445 kg) F Vag-Spont EPI  LIV  4 Term 05/21/14 2510w0d 13:58 / 00:07 7 lb 13 oz (3.544 kg) M Vag-Spont EPI  LIV  3 Term 03/12/13 8810w0d  6 lb 9 oz (2.977 kg) F Vag-Spont EPI  LIV     Birth Comments: baby came out blue, "was stuck for a while, low oxygen to brain"   2 Term 03/08/11 6021w0d  5 lb 4 oz (2.381 kg) F Vag-Spont EPI  LIV  1 Term 04/07/10 4268w0d  6 lb 3 oz (2.807 kg) F Vag-Spont EPI  LIV      Past Surgical History: Past Surgical History:  Procedure Laterality Date  . ABDOMINAL HERNIA REPAIR    . HERNIA REPAIR       Family History: Family History  Problem Relation Age of Onset  . Diabetes Mother   . Hypertension Mother   . Diabetes Maternal Grandmother   . Hypertension Maternal  Grandmother   . Heart disease Maternal Grandmother   . Cystic fibrosis Daughter     Social History: Social History  Substance Use Topics  . Smoking status: Light Tobacco Smoker    Packs/day: 0.25    Types: Cigarettes  . Smokeless tobacco: Never Used  . Alcohol use No    Allergies: No Known Allergies  Meds:  Prescriptions Prior to Admission  Medication Sig Dispense Refill Last Dose  . albuterol (PROVENTIL HFA;VENTOLIN HFA) 108 (90 Base) MCG/ACT inhaler Inhale 1-2 puffs into the lungs every 6 (six) hours as needed for wheezing or shortness of breath. 1 Inhaler 0 Past Month at Unknown time  . aspirin EC 81 MG tablet Take 1 tablet (81 mg total) by mouth daily. 30 tablet 3 08/20/2016 at 1600  . ferrous gluconate (FERGON) 324 MG tablet Take 1 tablet (324 mg total) by mouth 2 (two) times daily with a meal. 60 tablet 3 08/21/2016 at Unknown time  . ibuprofen (ADVIL,MOTRIN) 600 MG tablet Take 600 mg by mouth every 6 (six) hours as needed for mild pain or moderate pain.    08/20/2016 at 2000  . montelukast (SINGULAIR) 10 MG tablet Take 1 tablet (10 mg total) by  mouth at bedtime. 30 tablet 0 08/20/2016 at Unknown time    I have reviewed patient's Past Medical Hx, Surgical Hx, Family Hx, Social Hx, medications and allergies.   ROS:  A comprehensive ROS was negative except per HPI.    Physical Exam  Patient Vitals for the past 24 hrs:  BP Temp Temp src Pulse Resp SpO2  08/21/16 1558 138/79 98 F (36.7 C) Oral 111 17 100 %   Constitutional: Well-developed, well-nourished female in no acute distress.  Cardiovascular: normal rate Respiratory: normal effort GI: Abd soft, non-tender, gravid appropriate for gestational age. Pos BS x 4 MS: Extremities nontender, no edema, normal ROM Neurologic: Alert and oriented x 4.  GU: Neg CVAT. Pelvic: NEFG, physiologic discharge, no blood on glove. No CMT.  Dilation: Fingertip Effacement (%): Thick Cervical Position: Posterior Exam by:: Dr.  Omer JackMumaw  FHT:  Baseline 120, moderate variability, accelerations present, no decelerations Contractions: None on monitor. Palpated a contraction, readjusting TOCO.   Labs: No results found for this or any previous visit (from the past 24 hour(s)).  Imaging:  NA  MAU Course: UA GC/CT Wet Prep NST - REACTIVE  MDM: Plan of care reviewed with patient, including labs and tests ordered and medical treatment.  I personally reviewed the patient's NST today, found to be REACTIVE. 120 bpm, mod var, +accels, no decels. CTX: None.  - Dr. Gerda DissMumaw   Elizabeth Mumaw, DO OB Fellow [facility] 08/21/2016 4:29 PM   5:03 PM - Care turned over to [place] Sor[place] Vallier, CNM - No cervical change. Marland Kitchen.   MDM - Preterm contractions w/out evidence of active PTL.  - Transient HTN w/out evidence of Pre-E.   Assessment: 1. Preterm uterine contractions in third trimester, antepartum   2. Transient hypertension of pregnancy in third trimester    Plan: D/C home in stable condition. Increase fluids and rest.   Medication List    STOP taking these medications   ibuprofen 600 MG tablet Commonly known as:  ADVIL,MOTRIN     TAKE these medications   albuterol 108 (90 Base) MCG/ACT inhaler Commonly known as:  PROVENTIL HFA;VENTOLIN HFA Inhale 1-2 puffs into the lungs every 6 (six) hours as needed for wheezing or shortness of breath.   aspirin EC 81 MG tablet Take 1 tablet (81 mg total) by mouth daily.   ferrous gluconate 324 MG tablet Commonly known as:  FERGON Take 1 tablet (324 mg total) by mouth 2 (two) times daily with a meal.   montelukast 10 MG tablet Commonly known as:  SINGULAIR Take 1 tablet (10 mg total) by mouth at bedtime.      Follow-up Information    [facility] in 2 week(s).   Specialty:  Obstetrics and Gynecology Contact information: [address] [place] [id number] [phone]        [facility] Follow up.   Why:  as needed in emergencies Contact information: [address] [id number] [place] [id number] [phone]         Dorathy Kins[place] Lorna Strother, [place] 08/23/2016 9:48 PM

## 2016-08-21 NOTE — Discharge Instructions (Signed)
Braxton Hicks Contractions °Contractions of the uterus can occur throughout pregnancy. Contractions are not always a sign that you are in labor.  °WHAT ARE BRAXTON HICKS CONTRACTIONS?  °Contractions that occur before labor are called Braxton Hicks contractions, or false labor. Toward the end of pregnancy (32-34 weeks), these contractions can develop more often and may become more forceful. This is not true labor because these contractions do not result in opening (dilatation) and thinning of the cervix. They are sometimes difficult to tell apart from true labor because these contractions can be forceful and people have different pain tolerances. You should not feel embarrassed if you go to the hospital with false labor. Sometimes, the only way to tell if you are in true labor is for your health care provider to look for changes in the cervix. °If there are no prenatal problems or other health problems associated with the pregnancy, it is completely safe to be sent home with false labor and await the onset of true labor. °HOW CAN YOU TELL THE DIFFERENCE BETWEEN TRUE AND FALSE LABOR? °False Labor  °· The contractions of false labor are usually shorter and not as hard as those of true labor.   °· The contractions are usually irregular.   °· The contractions are often felt in the front of the lower abdomen and in the groin.   °· The contractions may go away when you walk around or change positions while lying down.   °· The contractions get weaker and are shorter lasting as time goes on.   °· The contractions do not usually become progressively stronger, regular, and closer together as with true labor.   °True Labor  °· Contractions in true labor last 30-70 seconds, become very regular, usually become more intense, and increase in frequency.   °· The contractions do not go away with walking.   °· The discomfort is usually felt in the top of the uterus and spreads to the lower abdomen and low back.   °· True labor can be  determined by your health care provider with an exam. This will show that the cervix is dilating and getting thinner.   °WHAT TO REMEMBER °· Keep up with your usual exercises and follow other instructions given by your health care provider.   °· Take medicines as directed by your health care provider.   °· Keep your regular prenatal appointments.   °· Eat and drink lightly if you think you are going into labor.   °· If Braxton Hicks contractions are making you uncomfortable:   °¨ Change your position from lying down or resting to walking, or from walking to resting.   °¨ Sit and rest in a tub of warm water.   °¨ Drink 2-3 glasses of water. Dehydration may cause these contractions.   °¨ Do slow and deep breathing several times an hour.   °WHEN SHOULD I SEEK IMMEDIATE MEDICAL CARE? °Seek immediate medical care if: °· Your contractions become stronger, more regular, and closer together.   °· You have fluid leaking or gushing from your vagina.   °· You have a fever.   °· You pass blood-tinged mucus.   °· You have vaginal bleeding.   °· You have continuous abdominal pain.   °· You have low back pain that you never had before.   °· You feel your baby's head pushing down and causing pelvic pressure.   °· Your baby is not moving as much as it used to.   °This information is not intended to replace advice given to you by your health care provider. Make sure you discuss any questions you have with your health care   provider. °Document Released: 09/05/2005 Document Revised: 12/28/2015 Document Reviewed: 06/17/2013 °Elsevier Interactive Patient Education © 2017 Elsevier Inc. ° °

## 2016-08-22 LAB — GC/CHLAMYDIA PROBE AMP (~~LOC~~) NOT AT ARMC
Chlamydia: NEGATIVE
Neisseria Gonorrhea: NEGATIVE

## 2016-09-02 ENCOUNTER — Ambulatory Visit (INDEPENDENT_AMBULATORY_CARE_PROVIDER_SITE_OTHER): Payer: Medicaid Other | Admitting: Obstetrics and Gynecology

## 2016-09-02 VITALS — BP 126/73 | HR 107 | Wt 196.1 lb

## 2016-09-02 DIAGNOSIS — Z641 Problems related to multiparity: Secondary | ICD-10-CM

## 2016-09-02 DIAGNOSIS — O09293 Supervision of pregnancy with other poor reproductive or obstetric history, third trimester: Secondary | ICD-10-CM

## 2016-09-02 DIAGNOSIS — O26899 Other specified pregnancy related conditions, unspecified trimester: Secondary | ICD-10-CM

## 2016-09-02 DIAGNOSIS — O09893 Supervision of other high risk pregnancies, third trimester: Secondary | ICD-10-CM

## 2016-09-02 DIAGNOSIS — Z3480 Encounter for supervision of other normal pregnancy, unspecified trimester: Secondary | ICD-10-CM

## 2016-09-02 DIAGNOSIS — Z6791 Unspecified blood type, Rh negative: Secondary | ICD-10-CM

## 2016-09-02 LAB — OB RESULTS CONSOLE GBS: GBS: POSITIVE

## 2016-09-02 NOTE — Progress Notes (Signed)
   PRENATAL VISIT NOTE  Subjective:  Mary Mitchell is a 20 y.o. G6P5005 at 2139w3d being seen today for ongoing prenatal care.  She is currently monitored for the following issues for this high-risk pregnancy and has Mild intermittent asthma; Tobacco abuse; Rh negative state in antepartum period; Encounter for supervision of normal pregnancy in multigravida; History of marijuana use; Grand multiparity; Short interval between pregnancies affecting pregnancy in third trimester, antepartum; and History of shoulder dystocia in prior pregnancy, currently pregnant in third trimester on her problem list.  Patient reports no complaints.  Contractions: Irregular. Vag. Bleeding: None.  Movement: Present. Denies leaking of fluid.   The following portions of the patient's history were reviewed and updated as appropriate: allergies, current medications, past family history, past medical history, past social history, past surgical history and problem list. Problem list updated.  Objective:   Vitals:   09/02/16 1145  BP: 126/73  Pulse: (!) 107  Weight: 196 lb 1.6 oz (89 kg)    Fetal Status: Fetal Heart Rate (bpm): 126 Fundal Height: 36 cm Movement: Present  Presentation: Vertex  General:  Alert, oriented and cooperative. Patient is in no acute distress.  Skin: Skin is warm and dry. No rash noted.   Cardiovascular: Normal heart rate noted  Respiratory: Normal respiratory effort, no problems with respiration noted  Abdomen: Soft, gravid, appropriate for gestational age. Pain/Pressure: Present     Pelvic:  Cervical exam performed Dilation: 2 Effacement (%): Thick Station: Ballotable  Extremities: Normal range of motion.  Edema: None  Mental Status: Normal mood and affect. Normal behavior. Normal judgment and thought content.   Assessment and Plan:  Pregnancy: G6P5005 at 3539w3d  1. Encounter for supervision of normal pregnancy in multigravida Patient is doing well Cultures collected Patient  remains undecided on contraception   2. History of shoulder dystocia in prior pregnancy, currently pregnant in third trimester No issues with last 2 pregnancies  3. Rh negative state in antepartum period S/p rhogam  4. Short interval between pregnancies affecting pregnancy in third trimester, antepartum   5. Grand multiparity   Preterm labor symptoms and general obstetric precautions including but not limited to vaginal bleeding, contractions, leaking of fluid and fetal movement were reviewed in detail with the patient. Please refer to After Visit Summary for other counseling recommendations.  Return in about 1 week (around 09/09/2016).   Catalina AntiguaPeggy Kalyani Maeda, MD

## 2016-09-02 NOTE — Progress Notes (Signed)
Pt complains of seeing "lots of dots", dizziness and headaches, lightheadedness

## 2016-09-03 LAB — CULTURE, BETA STREP (GROUP B ONLY)

## 2016-09-08 ENCOUNTER — Encounter (HOSPITAL_COMMUNITY): Payer: Self-pay | Admitting: *Deleted

## 2016-09-08 ENCOUNTER — Inpatient Hospital Stay (HOSPITAL_COMMUNITY)
Admission: AD | Admit: 2016-09-08 | Discharge: 2016-09-08 | Disposition: A | Discharge status: Home / Self Care | Payer: Medicaid Other | Source: Ambulatory Visit | Attending: Obstetrics & Gynecology | Admitting: Obstetrics & Gynecology

## 2016-09-08 ENCOUNTER — Inpatient Hospital Stay (HOSPITAL_COMMUNITY): Payer: Medicaid Other

## 2016-09-08 DIAGNOSIS — F909 Attention-deficit hyperactivity disorder, unspecified type: Secondary | ICD-10-CM | POA: Diagnosis not present

## 2016-09-08 DIAGNOSIS — O26893 Other specified pregnancy related conditions, third trimester: Secondary | ICD-10-CM | POA: Diagnosis not present

## 2016-09-08 DIAGNOSIS — R109 Unspecified abdominal pain: Secondary | ICD-10-CM | POA: Diagnosis not present

## 2016-09-08 DIAGNOSIS — Z3A37 37 weeks gestation of pregnancy: Secondary | ICD-10-CM | POA: Diagnosis not present

## 2016-09-08 DIAGNOSIS — O9989 Other specified diseases and conditions complicating pregnancy, childbirth and the puerperium: Secondary | ICD-10-CM | POA: Diagnosis not present

## 2016-09-08 DIAGNOSIS — O99333 Smoking (tobacco) complicating pregnancy, third trimester: Secondary | ICD-10-CM | POA: Insufficient documentation

## 2016-09-08 DIAGNOSIS — O368130 Decreased fetal movements, third trimester, not applicable or unspecified: Secondary | ICD-10-CM | POA: Insufficient documentation

## 2016-09-08 DIAGNOSIS — Z79899 Other long term (current) drug therapy: Secondary | ICD-10-CM | POA: Diagnosis not present

## 2016-09-08 DIAGNOSIS — O368190 Decreased fetal movements, unspecified trimester, not applicable or unspecified: Secondary | ICD-10-CM

## 2016-09-08 DIAGNOSIS — O09293 Supervision of pregnancy with other poor reproductive or obstetric history, third trimester: Secondary | ICD-10-CM | POA: Diagnosis not present

## 2016-09-08 DIAGNOSIS — M549 Dorsalgia, unspecified: Secondary | ICD-10-CM | POA: Insufficient documentation

## 2016-09-08 DIAGNOSIS — Z7982 Long term (current) use of aspirin: Secondary | ICD-10-CM | POA: Diagnosis not present

## 2016-09-08 DIAGNOSIS — Z9889 Other specified postprocedural states: Secondary | ICD-10-CM | POA: Insufficient documentation

## 2016-09-08 DIAGNOSIS — O99323 Drug use complicating pregnancy, third trimester: Secondary | ICD-10-CM | POA: Diagnosis present

## 2016-09-08 DIAGNOSIS — O99343 Other mental disorders complicating pregnancy, third trimester: Secondary | ICD-10-CM | POA: Diagnosis not present

## 2016-09-08 DIAGNOSIS — O99513 Diseases of the respiratory system complicating pregnancy, third trimester: Secondary | ICD-10-CM | POA: Insufficient documentation

## 2016-09-08 LAB — COMPREHENSIVE METABOLIC PANEL
ALBUMIN: 3.2 g/dL — AB (ref 3.5–5.0)
ALT: 6 U/L — ABNORMAL LOW (ref 14–54)
ANION GAP: 8 (ref 5–15)
AST: 14 U/L — ABNORMAL LOW (ref 15–41)
Alkaline Phosphatase: 117 U/L (ref 38–126)
BUN: 5 mg/dL — ABNORMAL LOW (ref 6–20)
CO2: 21 mmol/L — ABNORMAL LOW (ref 22–32)
Calcium: 8.5 mg/dL — ABNORMAL LOW (ref 8.9–10.3)
Chloride: 106 mmol/L (ref 101–111)
Creatinine, Ser: 0.39 mg/dL — ABNORMAL LOW (ref 0.44–1.00)
GFR calc Af Amer: >60 mL/min (ref >60)
GLUCOSE: 100 mg/dL — AB (ref 65–99)
POTASSIUM: 3.2 mmol/L — AB (ref 3.5–5.1)
Sodium: 135 mmol/L (ref 135–145)
TOTAL PROTEIN: 7.1 g/dL (ref 6.5–8.1)
Total Bilirubin: 0.6 mg/dL (ref 0.3–1.2)

## 2016-09-08 LAB — CBC
HEMATOCRIT: 30.8 % — AB (ref 36.0–46.0)
Hemoglobin: 10 g/dL — ABNORMAL LOW (ref 12.0–15.0)
MCH: 24.8 pg — ABNORMAL LOW (ref 26.0–34.0)
MCHC: 32.5 g/dL (ref 30.0–36.0)
MCV: 76.2 fL — ABNORMAL LOW (ref 78.0–100.0)
Platelets: 217 10*3/uL (ref 150–400)
RBC: 4.04 MIL/uL (ref 3.87–5.11)
RDW: 14.6 % (ref 11.5–15.5)
WBC: 7.6 10*3/uL (ref 4.0–10.5)

## 2016-09-08 LAB — WET PREP, GENITAL
CLUE CELLS WET PREP: NONE SEEN
Sperm: NONE SEEN
Trich, Wet Prep: NONE SEEN
Yeast Wet Prep HPF POC: NONE SEEN

## 2016-09-08 LAB — POCT FERN TEST

## 2016-09-08 NOTE — MAU Note (Signed)
C/o sharp lower back pain now along with abdominal cramping;Fern test was negative today;

## 2016-09-08 NOTE — MAU Note (Signed)
Co/ cramping since 0638 this AM;  ? SROM @ 0700 this AM;

## 2016-09-08 NOTE — Discharge Instructions (Signed)
Abdominal Pain During Pregnancy °Belly (abdominal) pain is common during pregnancy. Most of the time, it is not a serious problem. Other times, it can be a sign that something is wrong with the pregnancy. Always tell your doctor if you have belly pain. °Follow these instructions at home: °Monitor your belly pain for any changes. The following actions may help you feel better: °· Do not have sex (intercourse) or put anything in your vagina until you feel better. °· Rest until your pain stops. °· Drink clear fluids if you feel sick to your stomach (nauseous). Do not eat solid food until you feel better. °· Only take medicine as told by your doctor. °· Keep all doctor visits as told. °Get help right away if: °· You are bleeding, leaking fluid, or pieces of tissue come out of your vagina. °· You have more pain or cramping. °· You keep throwing up (vomiting). °· You have pain when you pee (urinate) or have blood in your pee. °· You have a fever. °· You do not feel your baby moving as much. °· You feel very weak or feel like passing out. °· You have trouble breathing, with or without belly pain. °· You have a very bad headache and belly pain. °· You have fluid leaking from your vagina and belly pain. °· You keep having watery poop (diarrhea). °· Your belly pain does not go away after resting, or the pain gets worse. °This information is not intended to replace advice given to you by your health care provider. Make sure you discuss any questions you have with your health care provider. °Document Released: 08/24/2009 Document Revised: 04/13/2016 Document Reviewed: 04/04/2013 °Elsevier Interactive Patient Education © 2017 Elsevier Inc. ° °

## 2016-09-08 NOTE — MAU Provider Note (Signed)
MAU PROVIDER NOTE  Chief Complaint:  Abdominal Pain; Rupture of Membranes; and Back Pain  HPI: Mary Mitchell is a 20 y.o. [id number] at 8053w2d who presents to maternity admissions reporting sharp pains and pressure in vaginal area.   Feels like the baby is coming out.  Hard to ambulate.  Thinks her water may have broken.    Fern test negative in MAU.    Denies contractions or vaginal bleeding. Good fetal movement.    Pregnancy Course:  History of shoulder dystocia in prior pregnancy, THC use  Past Medical History: Past Medical History:  Diagnosis Date  . ADHD (attention deficit hyperactivity disorder)   . Asthma   . Bipolar 1 disorder (HCC)   . PTSD (post-traumatic stress disorder)    Past obstetric history: OB History  Gravida Para Term Preterm AB Living  6 5 5  0 0 5  SAB TAB Ectopic Multiple Live Births  0 0 0 0 5    # Outcome Date GA Lbr Len/2nd Weight Sex Delivery Anes PTL Lv  6 Current           5 Term 04/14/15 6245w5d 06:39 / 00:02 7 lb 9.5 oz (3.445 kg) F Vag-Spont EPI  LIV  4 Term 05/21/14 3252w0d 13:58 / 00:07 7 lb 13 oz (3.544 kg) M Vag-Spont EPI  LIV  3 Term 03/12/13 6052w0d  6 lb 9 oz (2.977 kg) F Vag-Spont EPI  LIV     Birth Comments: baby came out blue, "was stuck for a while, low oxygen to brain"   2 Term 03/08/11 5635w0d  5 lb 4 oz (2.381 kg) F Vag-Spont EPI  LIV  1 Term 04/07/10 5772w0d  6 lb 3 oz (2.807 kg) F Vag-Spont EPI  LIV     Past Surgical History: Past Surgical History:  Procedure Laterality Date  . ABDOMINAL HERNIA REPAIR    . HERNIA REPAIR     Family History: Family History  Problem Relation Age of Onset  . Diabetes Mother   . Hypertension Mother   . Diabetes Maternal Grandmother   . Hypertension Maternal Grandmother   . Heart disease Maternal Grandmother   . Cystic fibrosis Daughter    Social History: Social History  Substance Use Topics  . Smoking status: Light Tobacco Smoker    Packs/day: 0.25    Types: Cigarettes  . Smokeless  tobacco: Current User  . Alcohol use No   Allergies: No Known Allergies  Meds:  Prescriptions Prior to Admission  Medication Sig Dispense Refill Last Dose  . albuterol (PROVENTIL HFA;VENTOLIN HFA) 108 (90 Base) MCG/ACT inhaler Inhale 1-2 puffs into the lungs every 6 (six) hours as needed for wheezing or shortness of breath. 1 Inhaler 0 09/07/2016 at Unknown time  . ferrous gluconate (FERGON) 324 MG tablet Take 1 tablet (324 mg total) by mouth 2 (two) times daily with a meal. 60 tablet 3 09/07/2016 at Unknown time  . aspirin EC 81 MG tablet Take 1 tablet (81 mg total) by mouth daily. (Patient not taking: Reported on 09/08/2016) 30 tablet 3 Not Taking at Unknown time  . montelukast (SINGULAIR) 10 MG tablet Take 1 tablet (10 mg total) by mouth at bedtime. (Patient not taking: Reported on 09/08/2016) 30 tablet 0 Not Taking at Unknown time   I have reviewed patient's Past Medical Hx, Surgical Hx, Family Hx, Social Hx, medications and allergies.   ROS:  A comprehensive ROS was negative except per HPI.   Physical Exam   Patient Vitals for the  past 24 hrs:  BP Temp Temp src Pulse Resp  09/08/16 1821 132/71 - - 107 -  09/08/16 1812 113/72 - - 108 -  09/08/16 1802 128/77 - - 108 -  09/08/16 1752 134/80 - - 104 -  09/08/16 1742 128/72 - - 103 -  09/08/16 1732 130/75 - - 103 -  09/08/16 1724 134/82 - - 99 18  09/08/16 1248 141/84 98.1 F (36.7 C) Oral 118 18   Constitutional: Well-developed, well-nourished female in no acute distress.  Cardiovascular: normal rate Respiratory: normal effort GI: Abd soft, non-tender, gravid appropriate for gestational age. Pos BS x 4 MS: Extremities nontender, no edema, normal ROM Neurologic: Alert and oriented x 4.  GU: Neg CVAT. Pelvic: NEFG, physiologic discharge, no blood, cervix clean. NO POOLING. No CMT  Dilation: 1.5 Effacement (%): Thick Station:  (high) Presentation: Vertex Exam by:: Dr Omer JackMumaw  FHT:  Baseline 130s , moderate variability,  accelerations present, no decelerations Contractions: N/A   Labs: Results for orders placed or performed during the hospital encounter of 09/08/16 (from the past 24 hour(s))  Fern Test     Status: Normal (Preliminary result)   Collection Time: 09/08/16  1:10 PM  Result Value Ref Range   [facility]    Wet prep, genital     Status: Abnormal   Collection Time: 09/08/16  4:00 PM  Result Value Ref Range   Yeast Wet Prep HPF POC NONE SEEN NONE SEEN   Trich, Wet Prep NONE SEEN NONE SEEN   Clue Cells Wet Prep HPF POC NONE SEEN NONE SEEN   WBC, Wet Prep HPF POC MANY (A) NONE SEEN   Sperm NONE SEEN   CBC     Status: Abnormal   Collection Time: 09/08/16  5:32 PM  Result Value Ref Range   WBC 7.6 4.0 - 10.5 K/uL   RBC 4.04 3.87 - 5.11 MIL/uL   Hemoglobin 10.0 (L) 12.0 - 15.0 g/dL   HCT 29.530.8 (L) 62.136.0 - 30.846.0 %   MCV 76.2 (L) 78.0 - 100.0 fL   MCH 24.8 (L) 26.0 - 34.0 pg   MCHC 32.5 30.0 - 36.0 g/dL   RDW 65.714.6 84.611.5 - 96.215.5 %   Platelets 217 150 - 400 K/uL  Comprehensive metabolic panel     Status: Abnormal   Collection Time: 09/08/16  5:32 PM  Result Value Ref Range   Sodium 135 135 - 145 mmol/L   Potassium 3.2 (L) 3.5 - 5.1 mmol/L   Chloride 106 101 - 111 mmol/L   CO2 21 (L) 22 - 32 mmol/L   Glucose, Bld 100 (H) 65 - 99 mg/dL   BUN 5 (L) 6 - 20 mg/dL   Creatinine, Ser 9.520.39 (L) 0.44 - 1.00 mg/dL   Calcium 8.5 (L) 8.9 - 10.3 mg/dL   Total Protein 7.1 6.5 - 8.1 g/dL   Albumin 3.2 (L) 3.5 - 5.0 g/dL   AST 14 (L) 15 - 41 U/L   ALT 6 (L) 14 - 54 U/L   Alkaline Phosphatase 117 38 - 126 U/L   Total Bilirubin 0.6 0.3 - 1.2 mg/dL   GFR calc non Af Amer >60 >60 mL/min   GFR calc Af Amer >60 >60 mL/min   Anion gap 8 5 - 15   Imaging:  Dg Ankle Complete Right  Result Date: 08/16/2016 CLINICAL DATA:  Generalized ankle pain after fall EXAM: RIGHT ANKLE - COMPLETE 3+ VIEW COMPARISON:  None. FINDINGS: No acute displaced fracture or malalignment. Ankle  mortise is symmetric. Possible small os  inferior to the lateral malleolus. Mild soft tissue swelling. IMPRESSION: No acute osseous abnormality Electronically Signed   By: Jasmine Pang M.D.   On: 08/16/2016 21:09   Dg Knee Complete 4 Views Right  Result Date: 08/16/2016 CLINICAL DATA:  Pain after fall EXAM: RIGHT KNEE - COMPLETE 4+ VIEW COMPARISON:  None. FINDINGS: No evidence of fracture, dislocation, or joint effusion. No evidence of arthropathy or other focal bone abnormality. Soft tissues are unremarkable. IMPRESSION: Negative. Electronically Signed   By: Jasmine Pang M.D.   On: 08/16/2016 21:10   [place] Mfm Ob Limited  Result Date: 09/08/2016 OBSTETRICAL ULTRASOUND: This exam was performed within a [place] [facility] Department. The OB US report was generated in the AS system, and faxed to the ordering physician.  This report is available in the [employer]. See the AS Obstetric US report via the Image Link.  MAU Course: Fern test x 2 - NEG SSE - NO pooling Wet prep - NEG BPP  Ultrasound Allow patient to ambulate and recheck cervix in one hour    MDM: Plan of care reviewed with patient, including labs and tests ordered and medical treatment.     Assessment: 1. Abdominal pain, unspecified abdominal location   2. Decreased fetal movement     Plan:  IUP at [redacted]w[redacted]d with abdominal pain and pressure in vaginal area.  Cervix closed.  Negative Fern x 2. Wet prep negative .  Cervix unchanged after ambulation.  Labor ruled out.   Discharge home in stable condition.  Labor precautions and fetal kick counts  Strict return precautions discussed.   Follow-up Information    [facility].   Specialty:  Obstetrics and Gynecology Contact information: [address] [place] [address] [phone]         Allergies as of 09/08/2016   No Known Allergies     Medication List    TAKE these medications   albuterol 108 (90 Base) MCG/ACT inhaler Commonly known as:   PROVENTIL HFA;VENTOLIN HFA Inhale 1-2 puffs into the lungs every 6 (six) hours as needed for wheezing or shortness of breath.   aspirin EC 81 MG tablet Take 1 tablet (81 mg total) by mouth daily.   ferrous gluconate 324 MG tablet Commonly known as:  FERGON Take 1 tablet (324 mg total) by mouth 2 (two) times daily with a meal.   montelukast 10 MG tablet Commonly known as:  SINGULAIR Take 1 tablet (10 mg total) by mouth at bedtime.      Freddrick March, MD PGY-1 09/08/2016 6:37 PM   OB FELLOW MAU DISCHARGE ATTESTATION  I have seen and examined this patient; I agree with above documentation in the resident's note.   I personally reviewed the patient's NST today, found to be REACTIVE. 130 bpm, mod var, +accels, no decels. CTX: RARE  Modified BPP with AFI 12cm, NORMAL/REACTIVE. OK for discharge, not in labor. Patient began feeling baby move while in MAU.  Jen Mow, DO OB Fellow 6:48 PM

## 2016-09-14 ENCOUNTER — Encounter: Payer: Medicaid Other | Admitting: Obstetrics and Gynecology

## 2016-09-14 ENCOUNTER — Ambulatory Visit (INDEPENDENT_AMBULATORY_CARE_PROVIDER_SITE_OTHER): Payer: Medicaid Other | Admitting: Obstetrics & Gynecology

## 2016-09-14 VITALS — BP 132/68 | HR 106 | Wt 194.5 lb

## 2016-09-14 DIAGNOSIS — Z3483 Encounter for supervision of other normal pregnancy, third trimester: Secondary | ICD-10-CM

## 2016-09-14 DIAGNOSIS — Z3480 Encounter for supervision of other normal pregnancy, unspecified trimester: Secondary | ICD-10-CM

## 2016-09-14 NOTE — BH Specialist Note (Signed)
Pt did not wish to talk today, but was given [place] Postpartum Planner, and encouraged to come back to talk at a later date to Integrated Behavioral Health Clinician at [facility].

## 2016-09-14 NOTE — Progress Notes (Signed)
Notes some leaking   PRENATAL VISIT NOTE  Subjective:  Mary Mitchell is a 20 y.o. G6P5005 at 7972w1d being seen today for ongoing prenatal care.  She is currently monitored for the following issues for this low-risk pregnancy and has Mild intermittent asthma; Tobacco abuse; Rh negative state in antepartum period; Supervision of normal pregnancy, antepartum; History of marijuana use; Grand multiparity; Short interval between pregnancies affecting pregnancy in third trimester, antepartum; and History of shoulder dystocia in prior pregnancy, currently pregnant in third trimester on her problem list.  Patient reports occasional contractions and small amount of leaking today.  Contractions: Irregular. Vag. Bleeding: None.  Movement: Present. Denies leaking of fluid.   The following portions of the patient's history were reviewed and updated as appropriate: allergies, current medications, past family history, past medical history, past social history, past surgical history and problem list. Problem list updated.  Objective:   Vitals:   09/14/16 1440  BP: 132/68  Pulse: (!) 106  Weight: 194 lb 8 oz (88.2 kg)    Fetal Status: Fetal Heart Rate (bpm): 128   Movement: Present     General:  Alert, oriented and cooperative. Patient is in no acute distress.  Skin: Skin is warm and dry. No rash noted.   Cardiovascular: Normal heart rate noted  Respiratory: Normal respiratory effort, no problems with respiration noted  Abdomen: Soft, gravid, appropriate for gestational age. Pain/Pressure: Present     Pelvic:  Cervical exam performed        Extremities: Normal range of motion.  Edema: None  Mental Status: Normal mood and affect. Normal behavior. Normal judgment and thought content.  Small amount of pool in vagina with negative fern by me Assessment and Plan:  Pregnancy: [id number] at 6072w1d   Term labor symptoms and general obstetric precautions including but not limited to vaginal bleeding,  contractions, leaking of fluid and fetal movement were reviewed in detail with the patient. Please refer to After Visit Summary for other counseling recommendations.  Return in about 1 week (around 09/21/2016).   Adam PhenixJames G Daily Crate, MD

## 2016-09-14 NOTE — Patient Instructions (Signed)

## 2016-09-18 ENCOUNTER — Inpatient Hospital Stay (HOSPITAL_COMMUNITY)
Admission: AD | Admit: 2016-09-18 | Discharge: 2016-09-18 | Disposition: A | Discharge status: Home / Self Care | Payer: Medicaid Other | Source: Ambulatory Visit | Attending: Obstetrics and Gynecology | Admitting: Obstetrics and Gynecology

## 2016-09-18 ENCOUNTER — Encounter (HOSPITAL_COMMUNITY): Payer: Self-pay | Admitting: *Deleted

## 2016-09-18 DIAGNOSIS — F4310 Post-traumatic stress disorder, unspecified: Secondary | ICD-10-CM | POA: Insufficient documentation

## 2016-09-18 DIAGNOSIS — O99513 Diseases of the respiratory system complicating pregnancy, third trimester: Secondary | ICD-10-CM | POA: Insufficient documentation

## 2016-09-18 DIAGNOSIS — O26893 Other specified pregnancy related conditions, third trimester: Secondary | ICD-10-CM | POA: Insufficient documentation

## 2016-09-18 DIAGNOSIS — O99333 Smoking (tobacco) complicating pregnancy, third trimester: Secondary | ICD-10-CM | POA: Insufficient documentation

## 2016-09-18 DIAGNOSIS — O99343 Other mental disorders complicating pregnancy, third trimester: Secondary | ICD-10-CM | POA: Insufficient documentation

## 2016-09-18 DIAGNOSIS — O471 False labor at or after 37 completed weeks of gestation: Secondary | ICD-10-CM | POA: Insufficient documentation

## 2016-09-18 DIAGNOSIS — Z9889 Other specified postprocedural states: Secondary | ICD-10-CM | POA: Diagnosis not present

## 2016-09-18 DIAGNOSIS — J45909 Unspecified asthma, uncomplicated: Secondary | ICD-10-CM | POA: Insufficient documentation

## 2016-09-18 DIAGNOSIS — F909 Attention-deficit hyperactivity disorder, unspecified type: Secondary | ICD-10-CM | POA: Diagnosis not present

## 2016-09-18 DIAGNOSIS — F319 Bipolar disorder, unspecified: Secondary | ICD-10-CM | POA: Diagnosis not present

## 2016-09-18 DIAGNOSIS — Z3A38 38 weeks gestation of pregnancy: Secondary | ICD-10-CM | POA: Insufficient documentation

## 2016-09-18 NOTE — MAU Provider Note (Signed)
History    G6P5005 @ 38.5 wks in with c/o contractions. Started several days ago. Denies ROM or vag bleeding.  CSN: [id number]  Arrival date & time 09/18/16  1714   None     Chief Complaint  Patient presents with  . Abdominal Cramping    HPI  Past Medical History:  Diagnosis Date  . ADHD (attention deficit hyperactivity disorder)   . Asthma   . Bipolar 1 disorder (HCC)   . PTSD (post-traumatic stress disorder)     Past Surgical History:  Procedure Laterality Date  . ABDOMINAL HERNIA REPAIR    . HERNIA REPAIR      Family History  Problem Relation Age of Onset  . Diabetes Mother   . Hypertension Mother   . Diabetes Maternal Grandmother   . Hypertension Maternal Grandmother   . Heart disease Maternal Grandmother   . Cystic fibrosis Daughter     Social History  Substance Use Topics  . Smoking status: Light Tobacco Smoker    Packs/day: 0.25    Types: Cigarettes  . Smokeless tobacco: Current User  . Alcohol use No    OB History    Gravida Para Term Preterm AB Living   6 5 5  0 0 5   SAB TAB Ectopic Multiple Live Births   0 0 0 0 5      Review of Systems  Constitutional: Negative.   HENT: Negative.   Eyes: Negative.   Respiratory: Negative.   Cardiovascular: Negative.   Gastrointestinal: Positive for abdominal pain.  Endocrine: Negative.   Genitourinary: Negative.   Musculoskeletal: Negative.   Skin: Negative.   Allergic/Immunologic: Negative.   Neurological: Negative.   Hematological: Negative.   Psychiatric/Behavioral: Negative.     Allergies  Patient has no known allergies.  Home Medications    BP 129/87 (BP Location: Right Arm)   Pulse (!) 139   Temp 98.3 F (36.8 C) (Oral)   Resp 18   LMP 10/21/2015 (Exact Date)   Physical Exam  Constitutional: She is oriented to person, place, and time. She appears well-developed and well-nourished.  Eyes: Pupils are equal, round, and reactive to light.  Cardiovascular: Normal rate, regular  rhythm, normal heart sounds and intact distal pulses.   Pulmonary/Chest: Effort normal and breath sounds normal.  Abdominal: Soft. Bowel sounds are normal.  Musculoskeletal: Normal range of motion.  Neurological: She is alert and oriented to person, place, and time. She has normal reflexes.  Skin: Skin is warm and dry.  Psychiatric: She has a normal mood and affect. Her behavior is normal. Judgment and thought content normal.    MAU Course  Procedures (including critical care time)  Labs Reviewed - No data to display No results found.   No diagnosis found. DX: false labor   MDM  SVE 1/th/post high. FHR pattern reassurring, only isolated uc's. Will d/c home with reassurring FHR pattern.

## 2016-09-18 NOTE — Discharge Instructions (Signed)
Braxton Hicks Contractions °Contractions of the uterus can occur throughout pregnancy. Contractions are not always a sign that you are in labor.  °WHAT ARE BRAXTON HICKS CONTRACTIONS?  °Contractions that occur before labor are called Braxton Hicks contractions, or false labor. Toward the end of pregnancy (32-34 weeks), these contractions can develop more often and may become more forceful. This is not true labor because these contractions do not result in opening (dilatation) and thinning of the cervix. They are sometimes difficult to tell apart from true labor because these contractions can be forceful and people have different pain tolerances. You should not feel embarrassed if you go to the hospital with false labor. Sometimes, the only way to tell if you are in true labor is for your health care provider to look for changes in the cervix. °If there are no prenatal problems or other health problems associated with the pregnancy, it is completely safe to be sent home with false labor and await the onset of true labor. °HOW CAN YOU TELL THE DIFFERENCE BETWEEN TRUE AND FALSE LABOR? °False Labor  °· The contractions of false labor are usually shorter and not as hard as those of true labor.   °· The contractions are usually irregular.   °· The contractions are often felt in the front of the lower abdomen and in the groin.   °· The contractions may go away when you walk around or change positions while lying down.   °· The contractions get weaker and are shorter lasting as time goes on.   °· The contractions do not usually become progressively stronger, regular, and closer together as with true labor.   °True Labor  °· Contractions in true labor last 30-70 seconds, become very regular, usually become more intense, and increase in frequency.   °· The contractions do not go away with walking.   °· The discomfort is usually felt in the top of the uterus and spreads to the lower abdomen and low back.   °· True labor can be  determined by your health care provider with an exam. This will show that the cervix is dilating and getting thinner.   °WHAT TO REMEMBER °· Keep up with your usual exercises and follow other instructions given by your health care provider.   °· Take medicines as directed by your health care provider.   °· Keep your regular prenatal appointments.   °· Eat and drink lightly if you think you are going into labor.   °· If Braxton Hicks contractions are making you uncomfortable:   °¨ Change your position from lying down or resting to walking, or from walking to resting.   °¨ Sit and rest in a tub of warm water.   °¨ Drink 2-3 glasses of water. Dehydration may cause these contractions.   °¨ Do slow and deep breathing several times an hour.   °WHEN SHOULD I SEEK IMMEDIATE MEDICAL CARE? °Seek immediate medical care if: °· Your contractions become stronger, more regular, and closer together.   °· You have fluid leaking or gushing from your vagina.   °· You have a fever.   °· You pass blood-tinged mucus.   °· You have vaginal bleeding.   °· You have continuous abdominal pain.   °· You have low back pain that you never had before.   °· You feel your baby's head pushing down and causing pelvic pressure.   °· Your baby is not moving as much as it used to.   °This information is not intended to replace advice given to you by your health care provider. Make sure you discuss any questions you have with your health care   provider. °Document Released: 09/05/2005 Document Revised: 12/28/2015 Document Reviewed: 06/17/2013 °Elsevier Interactive Patient Education © 2017 Elsevier Inc. ° °

## 2016-09-18 NOTE — MAU Note (Signed)
Contractions since about 10am.  Denies LOF.  Vaginal bleeding noted 2 days.

## 2016-09-18 NOTE — Progress Notes (Signed)
D lawson CNM notified of pts presenting complaints, gestational age, 66FHT.  Orders to recheck when FHT reactive and may d/c home if no change

## 2016-09-19 NOTE — L&D Delivery Note (Signed)
Delivery Note At 12:33 AM a viable female was delivered via Vaginal, Spontaneous Delivery (Presentation: vertex, LOA).  APGAR: 8, 9; weight pending .   Placenta status: spontaneous,intact .  Cord: 3 vessel with the following complications: none.  Cord pH: not obtained  Anesthesia: Epidural Episiotomy: None Lacerations: None Suture Repair: n/a Est. Blood Loss (mL): 150cc  Mom to postpartum.  Baby to Couplet care / Skin to Skin.  [name] 09/26/2016, 12:44 AM

## 2016-09-22 ENCOUNTER — Ambulatory Visit (INDEPENDENT_AMBULATORY_CARE_PROVIDER_SITE_OTHER): Payer: Medicaid Other | Admitting: Obstetrics and Gynecology

## 2016-09-22 VITALS — BP 135/81 | HR 91 | Wt 192.9 lb

## 2016-09-22 DIAGNOSIS — Z3480 Encounter for supervision of other normal pregnancy, unspecified trimester: Secondary | ICD-10-CM

## 2016-09-22 DIAGNOSIS — O26899 Other specified pregnancy related conditions, unspecified trimester: Secondary | ICD-10-CM

## 2016-09-22 DIAGNOSIS — Z6791 Unspecified blood type, Rh negative: Secondary | ICD-10-CM

## 2016-09-22 DIAGNOSIS — O09293 Supervision of pregnancy with other poor reproductive or obstetric history, third trimester: Secondary | ICD-10-CM

## 2016-09-22 DIAGNOSIS — O09893 Supervision of other high risk pregnancies, third trimester: Secondary | ICD-10-CM

## 2016-09-22 DIAGNOSIS — Z641 Problems related to multiparity: Secondary | ICD-10-CM

## 2016-09-22 NOTE — Progress Notes (Signed)
   PRENATAL VISIT NOTE  Subjective:  Mary Mitchell is a 21 y.o. G6P5005 at 4723w2d being seen today for ongoing prenatal care.  She is currently monitored for the following issues for this low-risk pregnancy and has Mild intermittent asthma; Tobacco abuse; Rh negative state in antepartum period; Supervision of normal pregnancy, antepartum; History of marijuana use; Grand multiparity; Short interval between pregnancies affecting pregnancy in third trimester, antepartum; and History of shoulder dystocia in prior pregnancy, currently pregnant in third trimester on her problem list.  Patient reports no complaints.  Contractions: Irregular. Vag. Bleeding: None.  Movement: Present. Denies leaking of fluid.   The following portions of the patient's history were reviewed and updated as appropriate: allergies, current medications, past family history, past medical history, past social history, past surgical history and problem list. Problem list updated.  Objective:   Vitals:   09/22/16 1349 09/22/16 1350  BP: (!) 141/79 135/81  Pulse: 93 91  Weight: 192 lb 14.4 oz (87.5 kg)     Fetal Status: Fetal Heart Rate (bpm): 130 Fundal Height: 38 cm Movement: Present  Presentation: Vertex  General:  Alert, oriented and cooperative. Patient is in no acute distress.  Skin: Skin is warm and dry. No rash noted.   Cardiovascular: Normal heart rate noted  Respiratory: Normal respiratory effort, no problems with respiration noted  Abdomen: Soft, gravid, appropriate for gestational age. Pain/Pressure: Present     Pelvic:  Cervical exam performed Dilation: 2.5 Effacement (%): 50 Station: -3  Extremities: Normal range of motion.  Edema: Trace  Mental Status: Normal mood and affect. Normal behavior. Normal judgment and thought content.   Assessment and Plan:  Pregnancy: G6P5005 at 5123w2d  1. Supervision of other normal pregnancy, antepartum Patient is doing well without complaints Membranes stripped Will  plan for postdates induction at 41 weeks if undelivered  2. History of shoulder dystocia in prior pregnancy, currently pregnant in third trimester   3. Short interval between pregnancies affecting pregnancy in third trimester, antepartum   4. Rh negative state in antepartum period S/p rhogam  5. Grand multiparity   Term labor symptoms and general obstetric precautions including but not limited to vaginal bleeding, contractions, leaking of fluid and fetal movement were reviewed in detail with the patient. Please refer to After Visit Summary for other counseling recommendations.  No Follow-up on file.   Catalina AntiguaPeggy Chelsea Pedretti, MD

## 2016-09-24 ENCOUNTER — Encounter (HOSPITAL_COMMUNITY): Payer: Self-pay

## 2016-09-24 ENCOUNTER — Inpatient Hospital Stay (HOSPITAL_COMMUNITY)
Admission: AD | Admit: 2016-09-24 | Discharge: 2016-09-24 | Disposition: A | Discharge status: Home / Self Care | Payer: Medicaid Other | Source: Ambulatory Visit | Attending: Obstetrics and Gynecology | Admitting: Obstetrics and Gynecology

## 2016-09-24 MED ORDER — ZOLPIDEM TARTRATE 5 MG PO TABS
5.0000 mg | ORAL_TABLET | Freq: Once | ORAL | Status: AC
  Administered 2016-09-24: 5 mg via ORAL
  Filled 2016-09-24: qty 1

## 2016-09-24 NOTE — Discharge Instructions (Signed)
Third Trimester of Pregnancy °The third trimester is from week 29 through week 42, months 7 through 9. This trimester is when your unborn baby (fetus) is growing very fast. At the end of the ninth month, the unborn baby is about 20 inches in length. It weighs about 6-10 pounds. °Follow these instructions at home: °· Avoid all smoking, herbs, and alcohol. Avoid drugs not approved by your doctor. °· Do not use any tobacco products, including cigarettes, chewing tobacco, and electronic cigarettes. If you need help quitting, ask your doctor. You may get counseling or other support to help you quit. °· Only take medicine as told by your doctor. Some medicines are safe and some are not during pregnancy. °· Exercise only as told by your doctor. Stop exercising if you start having cramps. °· Eat regular, healthy meals. °· Wear a good support bra if your breasts are tender. °· Do not use hot tubs, steam rooms, or saunas. °· Wear your seat belt when driving. °· Avoid raw meat, uncooked cheese, and liter boxes and soil used by cats. °· Take your prenatal vitamins. °· Take 1500-2000 milligrams of calcium daily starting at the 20th week of pregnancy until you deliver your baby. °· Try taking medicine that helps you poop (stool softener) as needed, and if your doctor approves. Eat more fiber by eating fresh fruit, vegetables, and whole grains. Drink enough fluids to keep your pee (urine) clear or pale yellow. °· Take warm water baths (sitz baths) to soothe pain or discomfort caused by hemorrhoids. Use hemorrhoid cream if your doctor approves. °· If you have puffy, bulging veins (varicose veins), wear support hose. Raise (elevate) your feet for 15 minutes, 3-4 times a day. Limit salt in your diet. °· Avoid heavy lifting, wear low heels, and sit up straight. °· Rest with your legs raised if you have leg cramps or low back pain. °· Visit your dentist if you have not gone during your pregnancy. Use a soft toothbrush to brush your  teeth. Be gentle when you floss. °· You can have sex (intercourse) unless your doctor tells you not to. °· Do not travel far distances unless you must. Only do so with your doctor's approval. °· Take prenatal classes. °· Practice driving to the hospital. °· Pack your hospital bag. °· Prepare the baby's room. °· Go to your doctor visits. °Get help if: °· You are not sure if you are in labor or if your water has broken. °· You are dizzy. °· You have mild cramps or pressure in your lower belly (abdominal). °· You have a nagging pain in your belly area. °· You continue to feel sick to your stomach (nauseous), throw up (vomit), or have watery poop (diarrhea). °· You have bad smelling fluid coming from your vagina. °· You have pain with peeing (urination). °Get help right away if: °· You have a fever. °· You are leaking fluid from your vagina. °· You are spotting or bleeding from your vagina. °· You have severe belly cramping or pain. °· You lose or gain weight rapidly. °· You have trouble catching your breath and have chest pain. °· You notice sudden or extreme puffiness (swelling) of your face, hands, ankles, feet, or legs. °· You have not felt the baby move in over an hour. °· You have severe headaches that do not go away with medicine. °· You have vision changes. °This information is not intended to replace advice given to you by your health care provider. Make   sure you discuss any questions you have with your health care provider. °Document Released: 11/30/2009 Document Revised: 02/11/2016 Document Reviewed: 11/06/2012 °Elsevier Interactive Patient Education © 2017 [employer] °Introduction °Patient Name: ________________________________________________ Patient Due Date: ____________________ °What is a fetal movement count? °A fetal movement count is the number of times that you feel your baby move during a certain amount of time. This may also be called a fetal kick count. A fetal movement count is recommended for  every pregnant woman. You may be asked to start counting fetal movements as early as week 28 of your pregnancy. °Pay attention to when your baby is most active. You may notice your baby's sleep and wake cycles. You may also notice things that make your baby move more. You should do a fetal movement count: °· When your baby is normally most active. °· At the same time each day. °A good time to count movements is while you are resting, after having something to eat and drink. °How do I count fetal movements? °1. Find a quiet, comfortable area. Sit, or lie down on your side. °2. Write down the date, the start time and stop time, and the number of movements that you felt between those two times. Take this information with you to your health care visits. °3. For 2 hours, count kicks, flutters, swishes, rolls, and jabs. You should feel at least 10 movements during 2 hours. °4. You may stop counting after you have felt 10 movements. °5. If you do not feel 10 movements in 2 hours, have something to eat and drink. Then, keep resting and counting for 1 hour. If you feel at least 4 movements during that hour, you may stop counting. °Contact a health care provider if: °· You feel fewer than 4 movements in 2 hours. °· Your baby is not moving like he or she usually does. °Date: ____________ Start time: ____________ Stop time: ____________ Movements: ____________ °Date: ____________ Start time: ____________ Stop time: ____________ Movements: ____________ °Date: ____________ Start time: ____________ Stop time: ____________ Movements: ____________ °Date: ____________ Start time: ____________ Stop time: ____________ Movements: ____________ °Date: ____________ Start time: ____________ Stop time: ____________ Movements: ____________ °Date: ____________ Start time: ____________ Stop time: ____________ Movements: ____________ °Date: ____________ Start time: ____________ Stop time: ____________ Movements: ____________ °Date: ____________  Start time: ____________ Stop time: ____________ Movements: ____________ °Date: ____________ Start time: ____________ Stop time: ____________ Movements: ____________ °This information is not intended to replace advice given to you by your health care provider. Make sure you discuss any questions you have with your health care provider. °Document Released: 10/05/2006 Document Revised: 05/04/2016 Document Reviewed: 10/15/2015 °Elsevier Interactive Patient Education © 2017 [employer] °Braxton Hicks Contractions °Contractions of the uterus can occur throughout pregnancy. Contractions are not always a sign that you are in labor.  °WHAT ARE BRAXTON HICKS CONTRACTIONS?  °Contractions that occur before labor are called Braxton Hicks contractions, or false labor. Toward the end of pregnancy (32-34 weeks), these contractions can develop more often and may become more forceful. This is not true labor because these contractions do not result in opening (dilatation) and thinning of the cervix. They are sometimes difficult to tell apart from true labor because these contractions can be forceful and people have different pain tolerances. You should not feel embarrassed if you go to the hospital with false labor. Sometimes, the only way to tell if you are in true labor is for your health care provider to look for changes in the cervix. °If there   are no prenatal problems or other health problems associated with the pregnancy, it is completely safe to be sent home with false labor and await the onset of true labor. °HOW CAN YOU TELL THE DIFFERENCE BETWEEN TRUE AND FALSE LABOR? °False Labor  °· The contractions of false labor are usually shorter and not as hard as those of true labor.   °· The contractions are usually irregular.   °· The contractions are often felt in the front of the lower abdomen and in the groin.   °· The contractions may go away when you walk around or change positions while lying down.   °· The contractions  get weaker and are shorter lasting as time goes on.   °· The contractions do not usually become progressively stronger, regular, and closer together as with true labor.   °True Labor  °· Contractions in true labor last 30-70 seconds, become very regular, usually become more intense, and increase in frequency.   °· The contractions do not go away with walking.   °· The discomfort is usually felt in the top of the uterus and spreads to the lower abdomen and low back.   °· True labor can be determined by your health care provider with an exam. This will show that the cervix is dilating and getting thinner.   °WHAT TO REMEMBER °· Keep up with your usual exercises and follow other instructions given by your health care provider.   °· Take medicines as directed by your health care provider.   °· Keep your regular prenatal appointments.   °· Eat and drink lightly if you think you are going into labor.   °· If Braxton Hicks contractions are making you uncomfortable:   °¨ Change your position from lying down or resting to walking, or from walking to resting.   °¨ Sit and rest in a tub of warm water.   °¨ Drink 2-3 glasses of water. Dehydration may cause these contractions.   °¨ Do slow and deep breathing several times an hour.   °WHEN SHOULD I SEEK IMMEDIATE MEDICAL CARE? °Seek immediate medical care if: °· Your contractions become stronger, more regular, and closer together.   °· You have fluid leaking or gushing from your vagina.   °· You have a fever.   °· You pass blood-tinged mucus.   °· You have vaginal bleeding.   °· You have continuous abdominal pain.   °· You have low back pain that you never had before.   °· You feel your baby's head pushing down and causing pelvic pressure.   °· Your baby is not moving as much as it used to.   °This information is not intended to replace advice given to you by your health care provider. Make sure you discuss any questions you have with your health care provider. °Document  Released: 09/05/2005 Document Revised: 12/28/2015 Document Reviewed: 06/17/2013 °Elsevier Interactive Patient Education © 2017 [employer] ° °

## 2016-09-24 NOTE — MAU Note (Signed)
Contractions since 1000. Stronger and closer now with bloody show. 3cm last sve. Denies LOF

## 2016-09-25 ENCOUNTER — Inpatient Hospital Stay (HOSPITAL_COMMUNITY): Payer: Medicaid Other | Admitting: Anesthesiology

## 2016-09-25 ENCOUNTER — Encounter (HOSPITAL_COMMUNITY): Payer: Self-pay

## 2016-09-25 ENCOUNTER — Inpatient Hospital Stay (HOSPITAL_COMMUNITY)
Admission: AD | Admit: 2016-09-25 | Discharge: 2016-09-28 | DRG: 775 | Disposition: A | Discharge status: Home / Self Care | Payer: Medicaid Other | Source: Ambulatory Visit | Attending: Obstetrics & Gynecology | Admitting: Obstetrics & Gynecology

## 2016-09-25 DIAGNOSIS — J4520 Mild intermittent asthma, uncomplicated: Secondary | ICD-10-CM | POA: Diagnosis present

## 2016-09-25 DIAGNOSIS — Z6791 Unspecified blood type, Rh negative: Secondary | ICD-10-CM | POA: Diagnosis not present

## 2016-09-25 DIAGNOSIS — Z3A39 39 weeks gestation of pregnancy: Secondary | ICD-10-CM | POA: Diagnosis not present

## 2016-09-25 DIAGNOSIS — O9952 Diseases of the respiratory system complicating childbirth: Secondary | ICD-10-CM | POA: Diagnosis present

## 2016-09-25 DIAGNOSIS — O99214 Obesity complicating childbirth: Secondary | ICD-10-CM | POA: Diagnosis present

## 2016-09-25 DIAGNOSIS — O26893 Other specified pregnancy related conditions, third trimester: Secondary | ICD-10-CM | POA: Diagnosis present

## 2016-09-25 DIAGNOSIS — Z3493 Encounter for supervision of normal pregnancy, unspecified, third trimester: Secondary | ICD-10-CM | POA: Diagnosis present

## 2016-09-25 DIAGNOSIS — Z6835 Body mass index (BMI) 35.0-35.9, adult: Secondary | ICD-10-CM

## 2016-09-25 DIAGNOSIS — F17210 Nicotine dependence, cigarettes, uncomplicated: Secondary | ICD-10-CM | POA: Diagnosis present

## 2016-09-25 DIAGNOSIS — O99334 Smoking (tobacco) complicating childbirth: Secondary | ICD-10-CM | POA: Diagnosis present

## 2016-09-25 DIAGNOSIS — O99824 Streptococcus B carrier state complicating childbirth: Principal | ICD-10-CM | POA: Diagnosis present

## 2016-09-25 DIAGNOSIS — Z8249 Family history of ischemic heart disease and other diseases of the circulatory system: Secondary | ICD-10-CM

## 2016-09-25 DIAGNOSIS — F1290 Cannabis use, unspecified, uncomplicated: Secondary | ICD-10-CM | POA: Diagnosis present

## 2016-09-25 DIAGNOSIS — O99324 Drug use complicating childbirth: Secondary | ICD-10-CM | POA: Diagnosis present

## 2016-09-25 LAB — RAPID URINE DRUG SCREEN, HOSP PERFORMED
Amphetamines: NOT DETECTED
BARBITURATES: NOT DETECTED
BENZODIAZEPINES: NOT DETECTED
COCAINE: NOT DETECTED
Opiates: NOT DETECTED
Tetrahydrocannabinol: POSITIVE — AB

## 2016-09-25 LAB — CBC
HEMATOCRIT: 32.5 % — AB (ref 36.0–46.0)
Hemoglobin: 10.8 g/dL — ABNORMAL LOW (ref 12.0–15.0)
MCH: 25 pg — ABNORMAL LOW (ref 26.0–34.0)
MCHC: 33.2 g/dL (ref 30.0–36.0)
MCV: 75.2 fL — ABNORMAL LOW (ref 78.0–100.0)
PLATELETS: 267 10*3/uL (ref 150–400)
RBC: 4.32 MIL/uL (ref 3.87–5.11)
RDW: 14.7 % (ref 11.5–15.5)
WBC: 9 10*3/uL (ref 4.0–10.5)

## 2016-09-25 LAB — TYPE AND SCREEN
ABO/RH(D): A NEG
ANTIBODY SCREEN: NEGATIVE

## 2016-09-25 MED ORDER — LACTATED RINGERS IV SOLN: 500.0000 mL | INTRAVENOUS | Status: DC | PRN

## 2016-09-25 MED ORDER — LACTATED RINGERS IV SOLN: 500.0000 mL | Freq: Once | INTRAVENOUS | Status: DC

## 2016-09-25 MED ORDER — SODIUM CHLORIDE 0.9% FLUSH: 3.0000 mL | INTRAVENOUS | Status: DC | PRN

## 2016-09-25 MED ORDER — OXYCODONE-ACETAMINOPHEN 5-325 MG PO TABS: 1.0000 | ORAL_TABLET | ORAL | Status: DC | PRN

## 2016-09-25 MED ORDER — OXYCODONE-ACETAMINOPHEN 5-325 MG PO TABS: 2.0000 | ORAL_TABLET | ORAL | Status: DC | PRN

## 2016-09-25 MED ORDER — EPHEDRINE 5 MG/ML INJ
10.0000 mg | INTRAVENOUS | Status: DC | PRN
  Filled 2016-09-25: qty 4

## 2016-09-25 MED ORDER — LIDOCAINE HCL (PF) 1 % IJ SOLN
30.0000 mL | INTRAMUSCULAR | Status: DC | PRN
  Filled 2016-09-25: qty 30

## 2016-09-25 MED ORDER — DIPHENHYDRAMINE HCL 50 MG/ML IJ SOLN: 12.5000 mg | INTRAMUSCULAR | Status: DC | PRN

## 2016-09-25 MED ORDER — OXYTOCIN BOLUS FROM INFUSION
500.0000 mL | Freq: Once | INTRAVENOUS | Status: AC
  Administered 2016-09-26: 500 mL via INTRAVENOUS

## 2016-09-25 MED ORDER — TERBUTALINE SULFATE 1 MG/ML IJ SOLN
0.2500 mg | Freq: Once | INTRAMUSCULAR | Status: DC | PRN
  Filled 2016-09-25: qty 1

## 2016-09-25 MED ORDER — PHENYLEPHRINE 40 MCG/ML (10ML) SYRINGE FOR IV PUSH (FOR BLOOD PRESSURE SUPPORT)
80.0000 ug | PREFILLED_SYRINGE | INTRAVENOUS | Status: DC | PRN
  Filled 2016-09-25: qty 5

## 2016-09-25 MED ORDER — OXYTOCIN 10 UNIT/ML IJ SOLN: 10.0000 [IU] | Freq: Once | INTRAMUSCULAR | Status: DC

## 2016-09-25 MED ORDER — LIDOCAINE HCL (PF) 1 % IJ SOLN
INTRAMUSCULAR | Status: DC | PRN
  Administered 2016-09-25: 4 mL
  Administered 2016-09-25: 6 mL via EPIDURAL

## 2016-09-25 MED ORDER — SOD CITRATE-CITRIC ACID 500-334 MG/5ML PO SOLN: 30.0000 mL | ORAL | Status: DC | PRN

## 2016-09-25 MED ORDER — OXYTOCIN 40 UNITS IN LACTATED RINGERS INFUSION - SIMPLE MED
2.5000 [IU]/h | INTRAVENOUS | Status: DC
  Administered 2016-09-26: 2.5 [IU]/h via INTRAVENOUS
  Filled 2016-09-25: qty 1000

## 2016-09-25 MED ORDER — PENICILLIN G POTASSIUM 5000000 UNITS IJ SOLR
5.0000 10*6.[IU] | Freq: Once | INTRAVENOUS | Status: AC
  Administered 2016-09-25: 5 10*6.[IU] via INTRAVENOUS
  Filled 2016-09-25: qty 5

## 2016-09-25 MED ORDER — ACETAMINOPHEN 325 MG PO TABS: 650.0000 mg | ORAL_TABLET | ORAL | Status: DC | PRN

## 2016-09-25 MED ORDER — FENTANYL CITRATE (PF) 100 MCG/2ML IJ SOLN
50.0000 ug | INTRAMUSCULAR | Status: DC | PRN
  Administered 2016-09-25: 100 ug via INTRAVENOUS
  Filled 2016-09-25: qty 2

## 2016-09-25 MED ORDER — PHENYLEPHRINE 40 MCG/ML (10ML) SYRINGE FOR IV PUSH (FOR BLOOD PRESSURE SUPPORT)
80.0000 ug | PREFILLED_SYRINGE | INTRAVENOUS | Status: DC | PRN
  Filled 2016-09-25: qty 10
  Filled 2016-09-25: qty 5

## 2016-09-25 MED ORDER — LACTATED RINGERS IV SOLN
INTRAVENOUS | Status: DC
  Administered 2016-09-25: 18:00:00 via INTRAVENOUS

## 2016-09-25 MED ORDER — HYDROXYZINE HCL 50 MG PO TABS
50.0000 mg | ORAL_TABLET | Freq: Four times a day (QID) | ORAL | Status: DC | PRN
  Filled 2016-09-25: qty 1

## 2016-09-25 MED ORDER — ONDANSETRON HCL 4 MG/2ML IJ SOLN: 4.0000 mg | Freq: Four times a day (QID) | INTRAMUSCULAR | Status: DC | PRN

## 2016-09-25 MED ORDER — SODIUM CHLORIDE 0.9 % IV SOLN: 250.0000 mL | INTRAVENOUS | Status: DC | PRN

## 2016-09-25 MED ORDER — SODIUM CHLORIDE 0.9% FLUSH: 3.0000 mL | Freq: Two times a day (BID) | INTRAVENOUS | Status: DC

## 2016-09-25 MED ORDER — FENTANYL 2.5 MCG/ML BUPIVACAINE 1/10 % EPIDURAL INFUSION (WH - ANES)
14.0000 mL/h | INTRAMUSCULAR | Status: DC | PRN
  Administered 2016-09-25: 14 mL/h via EPIDURAL
  Filled 2016-09-25: qty 100

## 2016-09-25 MED ORDER — OXYTOCIN 40 UNITS IN LACTATED RINGERS INFUSION - SIMPLE MED
1.0000 m[IU]/min | INTRAVENOUS | Status: DC
  Administered 2016-09-25: 2 m[IU]/min via INTRAVENOUS

## 2016-09-25 MED ORDER — PENICILLIN G POT IN DEXTROSE 60000 UNIT/ML IV SOLN
3.0000 10*6.[IU] | INTRAVENOUS | Status: DC
  Administered 2016-09-25: 3 10*6.[IU] via INTRAVENOUS
  Filled 2016-09-25 (×5): qty 50

## 2016-09-25 NOTE — Progress Notes (Addendum)
Mary SharpsCourtney M Mitchell is a 21 y.o. [id number] at 3341w5d by ultrasound admitted for early labor  Subjective: Patient resting comfortably, no complaints at this time. Hardly feeling her contractions at this time.   Objective: BP (!) 115/54   Pulse 86   Temp 98.1 F (36.7 C) (Oral)   Resp 18   Ht 5\' 2"  (1.575 m)   Wt 88.9 kg (196 lb)   LMP 10/21/2015 (Exact Date)   SpO2 100%   BMI 35.85 kg/m  No intake/output data recorded. No intake/output data recorded.  FHT:  FHR: 110 bpm, variability: minimal ,  accelerations:  Present,  decelerations:  Absent UC:   regular, every 2-5  minutes SVE:   Dilation: 7 Effacement (%): 80 Station: -2 Exam by:: Hermina Barters. Oneill Bais, MD  Labs: Lab Results  Component Value Date   WBC 9.0 09/25/2016   HGB 10.8 (L) 09/25/2016   HCT 32.5 (L) 09/25/2016   MCV 75.2 (L) 09/25/2016   PLT 267 09/25/2016    Assessment / Plan: Spontaneous labor, progressing normally  Labor: Progressing normally, patient still at -2 station, will add Pitocin at this time. Consider AROM if baby moves down more Fetal Wellbeing:  Category I Pain Control:  Epidural and IV pain meds I/D:  GBS positive- PCN Anticipated MOD:  NSVD  Beaulah DinningChristina M Elster Corbello 09/25/2016, 11:07 PM

## 2016-09-25 NOTE — H&P (Signed)
Mary Mitchell is a 21 y.o. female [id number] @ 39.5 presenting for contractions that have been off and on for two days. OB History    Gravida Para Term Preterm AB Living   6 5 5  0 0 5   SAB TAB Ectopic Multiple Live Births   0 0 0 0 5     Past Medical History:  Diagnosis Date  . ADHD (attention deficit hyperactivity disorder)   . Asthma   . Bipolar 1 disorder (HCC)   . PTSD (post-traumatic stress disorder)    Past Surgical History:  Procedure Laterality Date  . ABDOMINAL HERNIA REPAIR    . HERNIA REPAIR     Family History: family history includes Cystic fibrosis in her daughter; Diabetes in her maternal grandmother and mother; Heart disease in her maternal grandmother; Hypertension in her maternal grandmother and mother. Social History:  reports that she has been smoking Cigarettes.  She has been smoking about 0.25 packs per day. She uses smokeless tobacco. She reports that she does not drink alcohol or use drugs.     Maternal Diabetes: No Genetic Screening: Normal Maternal Ultrasounds/Referrals: Normal Fetal Ultrasounds or other Referrals:  None Maternal Substance Abuse:  Yes:  Type: Marijuana Significant Maternal Medications:  None Significant Maternal Lab Results:  Lab values include: Group B Strep positive Other Comments:  None  Review of Systems  Constitutional: Negative.   HENT: Negative.   Eyes: Negative.   Respiratory: Negative.   Cardiovascular: Negative.   Gastrointestinal: Positive for abdominal pain.  Genitourinary: Negative.   Musculoskeletal: Negative.   Skin: Negative.   Neurological: Negative.   Endo/Heme/Allergies: Negative.   Psychiatric/Behavioral: Negative.    Maternal Medical History:  Reason for admission: Contractions.   Contractions: Onset was 2 days ago.   Frequency: irregular.   Perceived severity is mild.    Fetal activity: Perceived fetal activity is normal.   Last perceived fetal movement was within the past hour.    Prenatal  complications: Substance abuse.   Prenatal Complications - Diabetes: none.      Blood pressure 134/76, pulse 96, temperature 98 F (36.7 C), temperature source Oral, height 5\' 2"  (1.575 m), weight 196 lb (88.9 kg), last menstrual period 10/21/2015, SpO2 98 %, unknown if currently breastfeeding. Maternal Exam:  Uterine Assessment: Contraction strength is moderate.  Contraction frequency is regular.   Abdomen: Patient reports no abdominal tenderness. Fundal height is 39.   Estimated fetal weight is 7lbs.   Fetal presentation: vertex  Introitus: Normal vulva. Normal vagina.  Ferning test: not done.  Nitrazine test: not done. Amniotic fluid character: not assessed.  Pelvis: adequate for delivery.   Cervix: Cervix evaluated by digital exam.     Fetal Exam Fetal Monitor Review: Mode: ultrasound.   Variability: moderate (6-25 bpm).   Pattern: accelerations present.    Fetal State Assessment: Category I - tracings are normal.     Physical Exam  Constitutional: She is oriented to person, place, and time. She appears well-developed and well-nourished.  HENT:  Head: Normocephalic.  Neck: Normal range of motion.  Cardiovascular: Normal rate, normal heart sounds and intact distal pulses.   Respiratory: Effort normal and breath sounds normal.  GI: Soft. Bowel sounds are normal.  Genitourinary: Vagina normal and uterus normal.  Musculoskeletal: Normal range of motion.  Neurological: She is alert and oriented to person, place, and time. She has normal reflexes.  Skin: Skin is warm and dry.  Psychiatric: She has a normal mood and affect. Her behavior  is normal. Judgment and thought content normal.    Prenatal labs: ABO, Rh: A/NEG/-- (05/25 1255) Antibody: NEG (05/25 1255) Rubella: 2.18 (05/25 1255) RPR: NON REAC (10/24 1452)  HBsAg: NEGATIVE (05/25 1255)  HIV: NONREACTIVE (10/24 1452)  GBS:     Assessment/Plan: Early labor 4/70/-2 GBS pos Will admit and give GBS prophylaxis  , IV pit aug  Wyvonnia Dusky 09/25/2016, 5:33 PM

## 2016-09-25 NOTE — Anesthesia Preprocedure Evaluation (Signed)
Anesthesia Evaluation  Patient identified by MRN, date of birth, ID band Patient awake    Reviewed: Allergy & Precautions, H&P , Patient's Chart, lab work & pertinent test results  Airway Mallampati: II  TM Distance: >3 FB Neck ROM: full    Dental  (+) Teeth Intact   Pulmonary asthma , Current Smoker,    breath sounds clear to auscultation       Cardiovascular  Rhythm:regular Rate:Normal     Neuro/Psych    GI/Hepatic   Endo/Other  Morbid obesity  Renal/GU      Musculoskeletal   Abdominal   Peds  Hematology   Anesthesia Other Findings       Reproductive/Obstetrics (+) Pregnancy                             Anesthesia Physical Anesthesia Plan  ASA: III  Anesthesia Plan: Epidural   Post-op Pain Management:    Induction:   Airway Management Planned:   Additional Equipment:   Intra-op Plan:   Post-operative Plan:   Informed Consent: I have reviewed the patients History and Physical, chart, labs and discussed the procedure including the risks, benefits and alternatives for the proposed anesthesia with the patient or authorized representative who has indicated his/her understanding and acceptance.   Dental Advisory Given  Plan Discussed with:   Anesthesia Plan Comments: (Labs checked- platelets confirmed with RN in room. Fetal heart tracing, per RN, reported to be stable enough for sitting procedure. Discussed epidural, and patient consents to the procedure:  included risk of possible headache,backache, failed block, allergic reaction, and nerve injury. This patient was asked if she had any questions or concerns before the procedure started.)        Anesthesia Quick Evaluation

## 2016-09-25 NOTE — Anesthesia Procedure Notes (Signed)
Epidural Patient location during procedure: OB Start time: 09/25/2016 7:41 PM End time: 09/25/2016 7:53 PM  Staffing Anesthesiologist: Cristela BlueJACKSON, Tzirel Leonor  Preanesthetic Checklist Completed: patient identified, site marked, surgical consent, pre-op evaluation, timeout performed, IV checked, risks and benefits discussed and monitors and equipment checked  Epidural Patient position: sitting Prep: site prepped and draped and DuraPrep Patient monitoring: continuous pulse ox and blood pressure Approach: midline Location: L3-L4 Injection technique: LOR air  Needle:  Needle type: Tuohy  Needle gauge: 17 G Needle length: 9 cm and 9 Needle insertion depth: 5 cm cm Catheter type: closed end flexible Catheter size: 19 Gauge Catheter at skin depth: 10 cm Test dose: negative  Assessment Events: blood not aspirated, injection not painful, no injection resistance, negative IV test and no paresthesia  Additional Notes Dosing of Epidural:  1st dose, through catheter ............................................Marland Kitchen.  Xylocaine 40 mg  2nd dose, through catheter, after waiting 3 minutes........Marland Kitchen.Xylocaine 60 mg    As each dose occurred, patient was free of IV sx; and patient exhibited no evidence of SA injection.  Patient is more comfortable after epidural dosed. Please see RN's note for documentation of vital signs,and FHR which are stable.  Patient reminded not to try to ambulate with numb legs, and that an RN must be present when she attempts to get up.

## 2016-09-25 NOTE — MAU Note (Signed)
Pt c/o contractions for the last 3 days that have been getting steadily worse. Pt denies bleeding and leaking. Pt states baby is moving normally.

## 2016-09-26 ENCOUNTER — Encounter (HOSPITAL_COMMUNITY): Payer: Self-pay

## 2016-09-26 DIAGNOSIS — Z3A39 39 weeks gestation of pregnancy: Secondary | ICD-10-CM

## 2016-09-26 DIAGNOSIS — O99824 Streptococcus B carrier state complicating childbirth: Secondary | ICD-10-CM

## 2016-09-26 LAB — CBC
HEMATOCRIT: 26.1 % — AB (ref 36.0–46.0)
HEMOGLOBIN: 8.7 g/dL — AB (ref 12.0–15.0)
MCH: 24.9 pg — ABNORMAL LOW (ref 26.0–34.0)
MCHC: 33.3 g/dL (ref 30.0–36.0)
MCV: 74.8 fL — ABNORMAL LOW (ref 78.0–100.0)
Platelets: 196 10*3/uL (ref 150–400)
RBC: 3.49 MIL/uL — AB (ref 3.87–5.11)
RDW: 14.8 % (ref 11.5–15.5)
WBC: 8.7 10*3/uL (ref 4.0–10.5)

## 2016-09-26 LAB — RPR: RPR Ser Ql: NONREACTIVE

## 2016-09-26 MED ORDER — IBUPROFEN 600 MG PO TABS
600.0000 mg | ORAL_TABLET | Freq: Four times a day (QID) | ORAL | Status: DC
  Administered 2016-09-26 – 2016-09-28 (×11): 600 mg via ORAL
  Filled 2016-09-26 (×11): qty 1

## 2016-09-26 MED ORDER — ZOLPIDEM TARTRATE 5 MG PO TABS: 5.0000 mg | ORAL_TABLET | Freq: Every evening | ORAL | Status: DC | PRN

## 2016-09-26 MED ORDER — ALBUTEROL SULFATE (2.5 MG/3ML) 0.083% IN NEBU: 3.0000 mL | INHALATION_SOLUTION | Freq: Four times a day (QID) | RESPIRATORY_TRACT | Status: DC | PRN

## 2016-09-26 MED ORDER — OXYCODONE-ACETAMINOPHEN 5-325 MG PO TABS
1.0000 | ORAL_TABLET | ORAL | Status: DC | PRN
  Administered 2016-09-26 – 2016-09-28 (×7): 2 via ORAL
  Filled 2016-09-26 (×7): qty 2

## 2016-09-26 MED ORDER — BENZOCAINE-MENTHOL 20-0.5 % EX AERO
1.0000 "application " | INHALATION_SPRAY | CUTANEOUS | Status: DC | PRN
  Administered 2016-09-26: 1 via TOPICAL
  Filled 2016-09-26: qty 56

## 2016-09-26 MED ORDER — PRENATAL MULTIVITAMIN CH
1.0000 | ORAL_TABLET | Freq: Every day | ORAL | Status: DC
  Administered 2016-09-26 – 2016-09-28 (×3): 1 via ORAL
  Filled 2016-09-26 (×3): qty 1

## 2016-09-26 MED ORDER — OXYCODONE HCL 5 MG PO TABS
10.0000 mg | ORAL_TABLET | ORAL | Status: DC | PRN
  Administered 2016-09-26 (×2): 10 mg via ORAL
  Filled 2016-09-26 (×3): qty 2

## 2016-09-26 MED ORDER — WITCH HAZEL-GLYCERIN EX PADS
1.0000 "application " | MEDICATED_PAD | CUTANEOUS | Status: DC | PRN
  Administered 2016-09-26 – 2016-09-27 (×2): 1 via TOPICAL

## 2016-09-26 MED ORDER — HYDROXYZINE HCL 50 MG PO TABS
50.0000 mg | ORAL_TABLET | Freq: Three times a day (TID) | ORAL | Status: DC | PRN
  Administered 2016-09-26: 50 mg via ORAL
  Filled 2016-09-26 (×2): qty 1

## 2016-09-26 MED ORDER — HYDROXYZINE HCL 25 MG PO TABS
25.0000 mg | ORAL_TABLET | Freq: Once | ORAL | Status: AC
Start: 2016-09-26 — End: 2016-09-26
  Administered 2016-09-26: 25 mg via ORAL
  Filled 2016-09-26: qty 1

## 2016-09-26 MED ORDER — ONDANSETRON HCL 4 MG PO TABS: 4.0000 mg | ORAL_TABLET | ORAL | Status: DC | PRN

## 2016-09-26 MED ORDER — DIPHENHYDRAMINE HCL 25 MG PO CAPS
25.0000 mg | ORAL_CAPSULE | Freq: Four times a day (QID) | ORAL | Status: DC | PRN
  Administered 2016-09-26: 25 mg via ORAL
  Filled 2016-09-26 (×3): qty 1

## 2016-09-26 MED ORDER — CETAPHIL MOISTURIZING EX LOTN: TOPICAL_LOTION | Freq: Every day | CUTANEOUS | Status: DC

## 2016-09-26 MED ORDER — SENNOSIDES-DOCUSATE SODIUM 8.6-50 MG PO TABS
2.0000 | ORAL_TABLET | ORAL | Status: DC
  Administered 2016-09-26 – 2016-09-28 (×2): 2 via ORAL
  Filled 2016-09-26 (×2): qty 2

## 2016-09-26 MED ORDER — COCONUT OIL OIL: 1.0000 "application " | TOPICAL_OIL | Status: DC | PRN

## 2016-09-26 MED ORDER — ONDANSETRON HCL 4 MG/2ML IJ SOLN
4.0000 mg | INTRAMUSCULAR | Status: DC | PRN
Start: 2016-09-26 — End: 2016-09-28

## 2016-09-26 MED ORDER — ACETAMINOPHEN 325 MG PO TABS
650.0000 mg | ORAL_TABLET | ORAL | Status: DC | PRN
  Administered 2016-09-26 (×2): 650 mg via ORAL
  Filled 2016-09-26 (×3): qty 2

## 2016-09-26 MED ORDER — DIBUCAINE 1 % RE OINT
1.0000 "application " | TOPICAL_OINTMENT | RECTAL | Status: DC | PRN
  Administered 2016-09-26 – 2016-09-27 (×2): 1 via RECTAL
  Filled 2016-09-26 (×2): qty 28

## 2016-09-26 MED ORDER — SIMETHICONE 80 MG PO CHEW: 80.0000 mg | CHEWABLE_TABLET | ORAL | Status: DC | PRN

## 2016-09-26 MED ORDER — OXYCODONE-ACETAMINOPHEN 5-325 MG PO TABS: 2.0000 | ORAL_TABLET | ORAL | Status: DC | PRN

## 2016-09-26 MED ORDER — TETANUS-DIPHTH-ACELL PERTUSSIS 5-2.5-18.5 LF-MCG/0.5 IM SUSP: 0.5000 mL | Freq: Once | INTRAMUSCULAR | Status: DC

## 2016-09-26 NOTE — Anesthesia Postprocedure Evaluation (Signed)
Anesthesia Post Note  Patient: Mary Mitchell  Procedure(s) Performed: * No procedures listed *  Patient location during evaluation: Mother Baby Anesthesia Type: Epidural Level of consciousness: awake, awake and alert and oriented Pain management: pain level controlled Vital Signs Assessment: post-procedure vital signs reviewed and stable Respiratory status: spontaneous breathing Cardiovascular status: blood pressure returned to baseline and stable Postop Assessment: no headache, no backache, patient able to bend at knees, no signs of nausea or vomiting and adequate PO intake Anesthetic complications: no        Last Vitals:  Vitals:   09/26/16 0200 09/26/16 0300  BP: 132/78 134/79  Pulse: 79 79  Resp: 18 18  Temp: 36.7 C 36.9 C    Last Pain:  Vitals:   09/26/16 0610  TempSrc:   PainSc: 10-Worst pain ever   Pain Goal: Patients Stated Pain Goal: 0 (09/25/16 1727)               Blythe Stanfordavid Adedayo Julyan Gales

## 2016-09-26 NOTE — Progress Notes (Addendum)
Went to bedside to clarify patient's children's medical history. It was noted in Epic that patient's child has cystic fibrosis. Upon further questioning, patient states that her oldest child is a carrier for cystic fibrosis gene but does NOT have the disease. I have passed this message along to the pediatric resident, Page, who is seeing the patient's baby this morning.   Anders Simmondshristina Tiffaney Heimann, MD [facility], PGY-2

## 2016-09-26 NOTE — Progress Notes (Signed)
UR chart review completed.  

## 2016-09-27 NOTE — Progress Notes (Signed)
Post Partum Day #1 Subjective: no complaints, up ad lib, voiding and tolerating PO  Objective: Blood pressure 135/84, pulse 80, temperature 98.2 F (36.8 C), temperature source Oral, resp. rate 20, height 5\' 2"  (1.575 m), weight 196 lb (88.9 kg), last menstrual period 10/21/2015, SpO2 99 %, unknown if currently breastfeeding.  Physical Exam:  General: alert, cooperative and no distress Lochia: appropriate Uterine Fundus: firm Incision: none DVT Evaluation: No evidence of DVT seen on physical exam. No cords or calf tenderness. No significant calf/ankle edema.   Recent Labs  09/25/16 1750 09/26/16 0509  HGB 10.8* 8.7*  HCT 32.5* 26.1*    Assessment/Plan: Plan for discharge tomorrow and Contraception declines   LOS: 2 days   Roe CoombsRachelle A Alejah Aristizabal, CNM 09/27/2016, 8:02 AM

## 2016-09-27 NOTE — Clinical Social Work Maternal (Signed)
CLINICAL SOCIAL WORK MATERNAL/CHILD NOTE  Patient Details  Name: Mary Mitchell MRN: [id number] Date of Birth: 05/15/1996  Date:  09/27/2016  Clinical Social Worker Initiating Note:  Frankie Scipio Boyd-Gilyard Date/ Time Initiated:  09/27/16/1108     Child's Name:  Mary Mitchell   Legal Guardian:  Mother (FOB is Lewis Sims 05/29/1989)   Need for Interpreter:  None   Date of Referral:  09/27/16     Reason for Referral:  Behavioral Health Issues, including SI , Current Substance Use/Substance Use During Pregnancy    Referral Source:  Central Nursery   Address:  [address] [place] [address]  Phone number:  [phone]   Household Members:  Self, Minor Children   Natural Supports (not living in the home):  Parent, Spouse/significant other, Immediate Family, Extended Family   Professional Supports: Case Manager/Social Worker ([name] is actively engaged with [facility] and [facility].)   Employment: Unemployed   Type of Work:     Education:  9 to 11 years   Financial Resources:  Medicaid   Other Resources:  Food Stamps    Cultural/Religious Considerations Which May Impact Care:  Per [name]'s Face Sheet, [name] is Non-Denominationa.  Strengths:  Ability to meet basic needs , Pediatrician chosen , Home prepared for child    Risk Factors/Current Problems:  Substance Use  (hx of DHHS involvement.)   Cognitive State:  Alert , Able to Concentrate , Linear Thinking    Mood/Affect:  Comfortable , Happy , Interested , Relaxed    CSW Assessment: CSW met with [name] to complete an assessment for a consult for hx of marijuana use and MH hx.  When CSW arrived, [name] was asleep in bed and infant was asleep in bassinet.  CSW offered to meet with [name] later, but [name] was insisting to meeting now.  [name] was polite, initially guarded, but interested in meeting with CSW.  CSW inquired about [name]'s MH hx and [name] denied a dx of bipolar.  [name] reported that when Hospital CSW made a [place]  CPS report on [name] in 2015, CPS mandated [name] to have a MH evaluation.  [name] communicated that the results of [name]'s MH  evaluation determined that [name] did not have bipolar d/o( CSW was unable to verify this information).  [name] denied a hx of PPD with [name]'s older children. However, CSW educated [name] about PPD. CSW informed [name] of possible supports and interventions to decrease PPD.  CSW also encouraged [name] to seek medical attention if needed for increased signs and symptoms for PPD. CSW inquired about [name]'s SA hx, and [name] acknowledged the use of marijuana throughout pregnancy.  [name] was unable to recall [name]'s last usage, but communicated, "I only puffed a blunt 2-3 times when I experienced pain". CSW informed [name] of the hospital's drug screen policy. CSW informed [name] of the 2 screenings for the infant. CSW explained to [name] that the infant's UDS was negative, and CSW will continue to monitor the infant's CDS.  CSW made [name] aware that if the infant's CDS is positive without an explanation for any substance, CSW would make a report to [place] CPS. [name] admitted a hx of CPS involvement, however informed CSW that [name] does not currently have an open case. CSW offered [name] SA resources and [name] declined. [name] reported that [name] is enrolled with [facility] and the [facility] Program. CSW encouraged [name] to continue to participate with both programs. CSW reviewed safe sleep and SIDS.  [name] responded appropriately to   CSW questions.  CSW thanked [name] for meeting with CSW and provided [name] with CSW contact information.   CSW verified with [place] CPS worker, Pam Miller, that [name] do not currently have an open CPS case.  CSW also informed [facility] caseworker, Shaquita Joyner, of [name]'s delivery. [facility] will follow-up with [name] after d/c.  CSW Plan/Description:  Information/Referral to Community Resources , Patient/Family Education , No Further Intervention Required/No Barriers to Discharge (CSW will continue to  monitor infant's cord, and will make a report to CPS if warranted .)   [name], MSW, LCSW Clinical Social Work [phone]    [name], LCSW 09/27/2016, 11:23 AM  

## 2016-09-28 ENCOUNTER — Encounter: Payer: Medicaid Other | Admitting: Obstetrics and Gynecology

## 2016-09-28 MED ORDER — OXYCODONE-ACETAMINOPHEN 5-325 MG PO TABS
1.0000 | ORAL_TABLET | ORAL | 0 refills | Status: DC | PRN
Start: 2016-09-28 — End: 2017-09-04

## 2016-09-28 MED ORDER — IBUPROFEN 600 MG PO TABS: 600.0000 mg | ORAL_TABLET | Freq: Four times a day (QID) | ORAL | 0 refills | Status: DC

## 2016-09-28 MED ORDER — FERROUS SULFATE 325 (65 FE) MG PO TBEC: 325.0000 mg | DELAYED_RELEASE_TABLET | Freq: Every day | ORAL | 3 refills | Status: DC

## 2016-09-28 NOTE — Discharge Instructions (Signed)
You are doing great and ready for discharge home. Your labs do show some signs of anemia. We recommend you take a daily iron supplement to boost your red blood cells, this has been prescribed to you. For pain, take Ibuprofen as prescribed. If the ibuprofen does not work, you can take a Percocet. Take care!    Postpartum Care After Vaginal Delivery The period of time right after you deliver your newborn is called the postpartum period. What kind of medical care will I receive?  You may continue to receive fluids and medicines through an IV tube inserted into one of your veins.  If an incision was made near your vagina (episiotomy) or if you had some vaginal tearing during delivery, cold compresses may be placed on your episiotomy or your tear. This helps to reduce pain and swelling.  You may be given a squirt bottle to use when you go to the bathroom. You may use this until you are comfortable wiping as usual. To use the squirt bottle, follow these steps:  Before you urinate, fill the squirt bottle with warm water. Do not use hot water.  After you urinate, while you are sitting on the toilet, use the squirt bottle to rinse the area around your urethra and vaginal opening. This rinses away any urine and blood.  You may do this instead of wiping. As you start healing, you may use the squirt bottle before wiping yourself. Make sure to wipe gently.  Fill the squirt bottle with clean water every time you use the bathroom.  You will be given sanitary pads to wear. How can I expect to feel?  You may not feel the need to urinate for several hours after delivery.  You will have some soreness and pain in your abdomen and vagina.  If you are breastfeeding, you may have uterine contractions every time you breastfeed for up to several weeks postpartum. Uterine contractions help your uterus return to its normal size.  It is normal to have vaginal bleeding (lochia) after delivery. The amount and  appearance of lochia is often similar to a menstrual period in the first week after delivery. It will gradually decrease over the next few weeks to a dry, yellow-brown discharge. For most women, lochia stops completely by 6-8 weeks after delivery. Vaginal bleeding can vary from woman to woman.  Within the first few days after delivery, you may have breast engorgement. This is when your breasts feel heavy, full, and uncomfortable. Your breasts may also throb and feel hard, tightly stretched, warm, and tender. After this occurs, you may have milk leaking from your breasts.Your health care provider can help you relieve discomfort due to breast engorgement. Breast engorgement should go away within a few days.  You may feel more sad or worried than normal due to hormonal changes after delivery. These feelings should not last more than a few days. If these feelings do not go away after several days, speak with your health care provider. How should I care for myself?  Tell your health care provider if you have pain or discomfort.  Drink enough water to keep your urine clear or pale yellow.  Wash your hands thoroughly with soap and water for at least 20 seconds after changing your sanitary pads, after using the toilet, and before holding or feeding your baby.  If you are not breastfeeding, avoid touching your breasts a lot. Doing this can make your breasts produce more milk.  If you become weak or lightheaded,  or you feel like you might faint, ask for help before:  Getting out of bed.  Showering.  Change your sanitary pads frequently. Watch for any changes in your flow, such as a sudden increase in volume, a change in color, the passing of large blood clots. If you pass a blood clot from your vagina, save it to show to your health care provider. Do not flush blood clots down the toilet without having your health care provider look at them.  Make sure that all your vaccinations are up to date. This  can help protect you and your baby from getting certain diseases. You may need to have immunizations done before you leave the hospital.  If desired, talk with your health care provider about methods of family planning or birth control (contraception). How can I start bonding with my baby? Spending as much time as possible with your baby is very important. During this time, you and your baby can get to know each other and develop a bond. Having your baby stay with you in your room (rooming in) can give you time to get to know your baby. Rooming in can also help you become comfortable caring for your baby. Breastfeeding can also help you bond with your baby. How can I plan for returning home with my baby?  Make sure that you have a car seat installed in your vehicle.  Your car seat should be checked by a certified car seat installer to make sure that it is installed safely.  Make sure that your baby fits into the car seat safely.  Ask your health care provider any questions you have about caring for yourself or your baby. Make sure that you are able to contact your health care provider with any questions after leaving the hospital. This information is not intended to replace advice given to you by your health care provider. Make sure you discuss any questions you have with your health care provider. Document Released: 07/03/2007 Document Revised: 02/08/2016 Document Reviewed: 08/10/2015 Elsevier Interactive Patient Education  2017 [employer].

## 2016-09-28 NOTE — Discharge Summary (Signed)
OB Discharge Summary     Patient Name: Lavinia SharpsCourtney M Ales DOB: 21-Jun-1996 MRN: [id number]  Date of admission: 09/25/2016 Delivering MD: Anders SimmondsGAMBINO, CHRISTINA M   Date of discharge: 09/28/2016  Admitting diagnosis: 39.5 WK CTXS Intrauterine pregnancy: 277w6d     Secondary diagnosis:  Active Problems:   Normal labor  Additional problems:  Patient Active Problem List   Diagnosis Date Noted  . Normal labor 09/25/2016  . Grand multiparity 08/18/2016  . Short interval between pregnancies affecting pregnancy in third trimester, antepartum 08/18/2016  . History of shoulder dystocia in prior pregnancy, currently pregnant in third trimester 08/18/2016  . History of marijuana use 03/21/2016  . Supervision of normal pregnancy, antepartum 04/01/2015  . Rh negative state in antepartum period 01/31/2014  . Mild intermittent asthma 01/30/2014  . Tobacco abuse 01/30/2014       Discharge diagnosis: Term Pregnancy Delivered                                                                                                Post partum procedures:none  Augmentation: Pitocin  Complications: None  Hospital course:  Onset of Labor With Vaginal Delivery     21 y.o. yo [id number] at 1577w6d was admitted in Active Labor on 09/25/2016. Patient had an uncomplicated labor course as follows:  Membrane Rupture Time/Date: 11:09 PM ,09/25/2016   Intrapartum Procedures: Episiotomy: None [1]                                         Lacerations:  None [1]  Patient had a delivery of a Viable infant. 09/26/2016  Information for the patient's newborn:  Valrie HartHarrington, Girl Toni AmendCourtney [[id number]][[id number]]  Delivery Method: Vag-Spont    Patient had an uncomplicated postpartum course. She is ambulating, tolerating a regular diet, passing flatus, and urinating well.   Social Work was consulted due to Hx of Behavioral Health Issues and positive Marijuana smoking during pregnancy. There were no barriers for discharge from social work's stand  point.  Additionally her hemoglobin was low at 8.7, it was advised she take iron supplementation, this medication was sent to her pharmacy.   Patient is discharged home in stable condition on 09/28/16.  Physical exam  Vitals:   09/26/16 2140 09/27/16 0636 09/27/16 1913 09/28/16 0757  BP: 128/89 135/84 139/80 129/79  Pulse:  80 87 88  Resp:  20 16 14   Temp:  98.2 F (36.8 C) 98.9 F (37.2 C) 98.4 F (36.9 C)  TempSrc:  Oral Axillary Oral  SpO2:      Weight:      Height:       General: alert, cooperative and no distress Lochia: appropriate Uterine Fundus: firm Incision: N/A DVT Evaluation: Negative Homan's sign. No cords or calf tenderness. No significant calf/ankle edema. Labs: Lab Results  Component Value Date   WBC 8.7 09/26/2016   HGB 8.7 (L) 09/26/2016   HCT 26.1 (L) 09/26/2016   MCV 74.8 (L) 09/26/2016   PLT 196 09/26/2016   CMP Latest  Ref Rng & Units 09/08/2016  Glucose 65 - 99 mg/dL 956(O)  BUN 6 - 20 mg/dL 5(L)  Creatinine 1.30 - 1.00 mg/dL 8.65(H)  Sodium 846 - 962 mmol/L 135  Potassium 3.5 - 5.1 mmol/L 3.2(L)  Chloride 101 - 111 mmol/L 106  CO2 22 - 32 mmol/L 21(L)  Calcium 8.9 - 10.3 mg/dL 9.5(M)  Total Protein 6.5 - 8.1 g/dL 7.1  Total Bilirubin 0.3 - 1.2 mg/dL 0.6  Alkaline Phos 38 - 126 U/L 117  AST 15 - 41 U/L 14(L)  ALT 14 - 54 U/L 6(L)    Discharge instruction: per After Visit Summary and "Baby and Me Booklet".  After visit meds:  Allergies as of 09/28/2016   No Known Allergies     Medication List    TAKE these medications   albuterol 108 (90 Base) MCG/ACT inhaler Commonly known as:  PROVENTIL HFA;VENTOLIN HFA Inhale 1-2 puffs into the lungs every 6 (six) hours as needed for wheezing or shortness of breath.   ferrous sulfate 325 (65 FE) MG EC tablet Take 1 tablet (325 mg total) by mouth daily with breakfast.   ibuprofen 600 MG tablet Commonly known as:  ADVIL,MOTRIN Take 1 tablet (600 mg total) by mouth every 6 (six) hours.    oxyCODONE-acetaminophen 5-325 MG tablet Commonly known as:  PERCOCET/ROXICET Take 1-2 tablets by mouth every 4 (four) hours as needed for severe pain.       Diet: routine diet  Activity: Advance as tolerated. Pelvic rest for 6 weeks.   Outpatient follow up:6 weeks Follow up Appt: Future Appointments Date Time Provider Department Center  11/02/2016 2:00 PM Hurshel Party, CNM [facility]   Follow up Visit: At follow up, consider rechecking a CBC as her hgb was 8.7 at discharge.   Postpartum contraception: None, patient was asked numerous times throughout hospital course and continued to decline.   Newborn Data: Live born female  Birth Weight: 7 lb 0.7 oz (3195 g) APGAR: 8, 9  Baby Feeding: Bottle Disposition:home with mother   09/28/2016 Beaulah Dinning, MD  OB FELLOW DISCHARGE ATTESTATION  I have seen and examined this patient and agree with above documentation in the resident's note.   Ernestina Penna, MD 11:01 AM

## 2016-09-28 NOTE — Plan of Care (Signed)
Problem: Education: Goal: Knowledge of condition will improve Reviewed discharge education with patient. Verbalized understanding of information.

## 2016-10-04 NOTE — Progress Notes (Signed)
[facility] [place] [facility] [place] [facility] [place] with C[place] worker, Bernie CoveyPam Miller.  Infant CDS was positive for THC.  CPS will follow-up with family.  Blaine HamperAngel Boyd-Gilyard, MSW, LCSW Clinical Social Work [phone]

## 2016-11-02 ENCOUNTER — Encounter: Payer: Self-pay | Admitting: *Deleted

## 2016-11-02 ENCOUNTER — Ambulatory Visit: Payer: Medicaid Other | Admitting: Advanced Practice Midwife

## 2016-11-02 NOTE — Progress Notes (Signed)
Mary Mitchell missed a scheduled postpartum visit. Per chart review and discussion with provider do not need to call patient, may reschedule if she calls us.

## 2016-11-15 ENCOUNTER — Encounter: Payer: Self-pay | Admitting: *Deleted

## 2016-11-15 ENCOUNTER — Ambulatory Visit: Payer: Medicaid Other | Admitting: Student

## 2016-11-15 NOTE — Progress Notes (Signed)
Mary Mitchell missed a scheduled postpartum visit today. Will send letter to notify her.

## 2017-05-30 ENCOUNTER — Encounter: Payer: Self-pay | Admitting: *Deleted

## 2017-05-30 ENCOUNTER — Ambulatory Visit (INDEPENDENT_AMBULATORY_CARE_PROVIDER_SITE_OTHER): Payer: Self-pay | Admitting: *Deleted

## 2017-05-30 DIAGNOSIS — Z3490 Encounter for supervision of normal pregnancy, unspecified, unspecified trimester: Secondary | ICD-10-CM

## 2017-05-30 DIAGNOSIS — Z3201 Encounter for pregnancy test, result positive: Secondary | ICD-10-CM

## 2017-05-30 LAB — POCT PREGNANCY, URINE: Preg Test, Ur: POSITIVE — AB

## 2017-05-30 MED ORDER — PRENATAL VITAMINS 0.8 MG PO TABS: 1.0000 | ORAL_TABLET | Freq: Every day | ORAL | 12 refills | Status: DC

## 2017-05-30 NOTE — Progress Notes (Signed)
Pt presents to clinic for pregnancy test which is positive. Patient not sure of LMP, but got married on Apr 15 and her period started shortly after. Using approximate date, patient is roughly 21 weeks today. Verified FHT=150. Reviewed allergies and medications, ordered pnv at her request. Advised patient stop smoking, avoid drugs and alcohol. U/s scheduled for 9/19 @ 1045. Pregnancy verification letter given, advised beginnging pnc asap. Patient voiced understanding of all instructions and is going to the front desk to schedule an initial prenatal visit.

## 2017-06-07 ENCOUNTER — Ambulatory Visit (HOSPITAL_COMMUNITY)
Admission: RE | Admit: 2017-06-07 | Discharge: 2017-06-07 | Disposition: A | Discharge status: Home / Self Care | Payer: Self-pay | Source: Ambulatory Visit | Attending: Family Medicine | Admitting: Family Medicine

## 2017-06-07 DIAGNOSIS — O09892 Supervision of other high risk pregnancies, second trimester: Secondary | ICD-10-CM | POA: Insufficient documentation

## 2017-06-07 DIAGNOSIS — Z3687 Encounter for antenatal screening for uncertain dates: Secondary | ICD-10-CM | POA: Insufficient documentation

## 2017-06-07 DIAGNOSIS — Z3490 Encounter for supervision of normal pregnancy, unspecified, unspecified trimester: Secondary | ICD-10-CM

## 2017-06-07 DIAGNOSIS — Z3A22 22 weeks gestation of pregnancy: Secondary | ICD-10-CM | POA: Insufficient documentation

## 2017-06-07 DIAGNOSIS — Z3689 Encounter for other specified antenatal screening: Secondary | ICD-10-CM | POA: Insufficient documentation

## 2017-06-07 DIAGNOSIS — O0942 Supervision of pregnancy with grand multiparity, second trimester: Secondary | ICD-10-CM | POA: Insufficient documentation

## 2017-06-20 ENCOUNTER — Encounter: Payer: Self-pay | Admitting: Student

## 2017-07-05 ENCOUNTER — Encounter: Payer: Self-pay | Admitting: Student

## 2017-07-19 ENCOUNTER — Encounter: Payer: Self-pay | Admitting: Medical

## 2017-09-04 ENCOUNTER — Other Ambulatory Visit: Payer: Self-pay

## 2017-09-04 ENCOUNTER — Inpatient Hospital Stay (HOSPITAL_COMMUNITY)
Admission: AD | Admit: 2017-09-04 | Discharge: 2017-09-04 | Disposition: A | Discharge status: Home / Self Care | Payer: Medicaid Other | Source: Ambulatory Visit | Attending: Obstetrics and Gynecology | Admitting: Obstetrics and Gynecology

## 2017-09-04 ENCOUNTER — Encounter (HOSPITAL_COMMUNITY): Payer: Self-pay

## 2017-09-04 DIAGNOSIS — F17210 Nicotine dependence, cigarettes, uncomplicated: Secondary | ICD-10-CM | POA: Insufficient documentation

## 2017-09-04 DIAGNOSIS — Z3A34 34 weeks gestation of pregnancy: Secondary | ICD-10-CM | POA: Diagnosis not present

## 2017-09-04 DIAGNOSIS — O3430 Maternal care for cervical incompetence, unspecified trimester: Secondary | ICD-10-CM

## 2017-09-04 DIAGNOSIS — O98813 Other maternal infectious and parasitic diseases complicating pregnancy, third trimester: Secondary | ICD-10-CM | POA: Insufficient documentation

## 2017-09-04 DIAGNOSIS — B373 Candidiasis of vulva and vagina: Secondary | ICD-10-CM | POA: Diagnosis present

## 2017-09-04 DIAGNOSIS — O99333 Smoking (tobacco) complicating pregnancy, third trimester: Secondary | ICD-10-CM | POA: Insufficient documentation

## 2017-09-04 DIAGNOSIS — B3731 Acute candidiasis of vulva and vagina: Secondary | ICD-10-CM

## 2017-09-04 DIAGNOSIS — R109 Unspecified abdominal pain: Secondary | ICD-10-CM | POA: Diagnosis present

## 2017-09-04 DIAGNOSIS — R102 Pelvic and perineal pain unspecified side: Secondary | ICD-10-CM

## 2017-09-04 DIAGNOSIS — O0933 Supervision of pregnancy with insufficient antenatal care, third trimester: Secondary | ICD-10-CM

## 2017-09-04 LAB — CBC
HCT: 31.3 % — ABNORMAL LOW (ref 36.0–46.0)
Hemoglobin: 9.8 g/dL — ABNORMAL LOW (ref 12.0–15.0)
MCH: 24 pg — AB (ref 26.0–34.0)
MCHC: 31.3 g/dL (ref 30.0–36.0)
MCV: 76.5 fL — ABNORMAL LOW (ref 78.0–100.0)
PLATELETS: 201 10*3/uL (ref 150–400)
RBC: 4.09 MIL/uL (ref 3.87–5.11)
RDW: 15 % (ref 11.5–15.5)
WBC: 6.3 10*3/uL (ref 4.0–10.5)

## 2017-09-04 LAB — WET PREP, GENITAL
Clue Cells Wet Prep HPF POC: NONE SEEN
Sperm: NONE SEEN
Trich, Wet Prep: NONE SEEN

## 2017-09-04 LAB — URINALYSIS, ROUTINE W REFLEX MICROSCOPIC
BACTERIA UA: NONE SEEN
BILIRUBIN URINE: NEGATIVE
Glucose, UA: NEGATIVE mg/dL
Hgb urine dipstick: NEGATIVE
KETONES UR: 20 mg/dL — AB
Nitrite: NEGATIVE
Protein, ur: NEGATIVE mg/dL
Specific Gravity, Urine: 1.01 (ref 1.005–1.030)
pH: 7 (ref 5.0–8.0)

## 2017-09-04 LAB — HEPATITIS B SURFACE ANTIGEN: Hepatitis B Surface Ag: NEGATIVE

## 2017-09-04 LAB — ABO/RH: ABO/RH(D): A NEG

## 2017-09-04 MED ORDER — TERCONAZOLE 0.4 % VA CREA: 1.0000 | TOPICAL_CREAM | Freq: Every day | VAGINAL | 0 refills | Status: AC

## 2017-09-04 NOTE — MAU Provider Note (Signed)
History     CSN: [id number]  Arrival date and time: 09/04/17 1211   First Provider Initiated Contact with Patient 09/04/17 1314      Chief Complaint  Patient presents with  . Abdominal Pain  . Vaginal Pain   HPI  [name] is a 21 y.o. G7P6006 at 4551w6d gestation by LMP presenting to MAU via EMS for "sharp shooting pains in vagina". She reports the pains started when she was walking her daughter to the bus stop. [facility]. She has he first prenatal visit with [facility] on 12/20. She denies contractions, VB or abnormal vaginal d/c.  Past Medical History:  Diagnosis Date  . ADHD (attention deficit hyperactivity disorder)   . Asthma   . Bipolar 1 disorder (HCC)   . PTSD (post-traumatic stress disorder)     Past Surgical History:  Procedure Laterality Date  . ABDOMINAL HERNIA REPAIR    . HERNIA REPAIR      Family History  Problem Relation Age of Onset  . Diabetes Mother   . Hypertension Mother   . Diabetes Maternal Grandmother   . Hypertension Maternal Grandmother   . Heart disease Maternal Grandmother   . Cystic fibrosis Daughter     Social History   Tobacco Use  . Smoking status: Light Tobacco Smoker    Packs/day: 0.25    Types: Cigarettes  . Smokeless tobacco: Current User  Substance Use Topics  . Alcohol use: No  . Drug use: No    Allergies: No Known Allergies  Medications Prior to Admission  Medication Sig Dispense Refill Last Dose  . albuterol (PROVENTIL HFA;VENTOLIN HFA) 108 (90 Base) MCG/ACT inhaler Inhale 1-2 puffs into the lungs every 6 (six) hours as needed for wheezing or shortness of breath. (Patient not taking: Reported on 05/30/2017) 1 Inhaler 0 Not Taking  . ferrous sulfate 325 (65 FE) MG EC tablet Take 1 tablet (325 mg total) by mouth daily with breakfast. (Patient not taking: Reported on 05/30/2017) 30 tablet 3 Not Taking  . ibuprofen (ADVIL,MOTRIN) 600 MG tablet Take 1 tablet (600 mg  total) by mouth every 6 (six) hours. (Patient not taking: Reported on 05/30/2017) 30 tablet 0 Not Taking  . oxyCODONE-acetaminophen (PERCOCET/ROXICET) 5-325 MG tablet Take 1-2 tablets by mouth every 4 (four) hours as needed for severe pain. (Patient not taking: Reported on 05/30/2017) 10 tablet 0 Not Taking  . Prenatal Multivit-Min-Fe-FA (PRENATAL VITAMINS) 0.8 MG tablet Take 1 tablet by mouth daily. 30 tablet 12     Review of Systems Physical Exam   Blood pressure 129/75, pulse (!) 101, temperature 99 F (37.2 C), temperature source Oral, resp. rate 18, last menstrual period 01/03/2017.   Physical Exam  Constitutional: She is oriented to person, place, and time.  Genitourinary:  Genitourinary Comments: Uterus: gravid, S=D, cx: smooth, pink, no lesions, moderate amt of thin, whitish yellow vaginal d/c, 1cm/50%/high/soft, no CMT or friability, no adnexal tenderness   Neurological: She is alert and oriented to person, place, and time.  Skin: Skin is warm and dry.  Psychiatric: She has a normal mood and affect. Her behavior is normal. Judgment and thought content normal.    MAU Course  Procedures  MDM CC[facility] Prenatal Panel Wet Prep  GC/CT NST - FHR: 130 bpm / moderate variability / accels present / decels absent / TOCO: irregular, occasional UC's   Results for orders placed or performed during the hospital encounter of 09/04/17 (from the  past 24 hour(s))  Urinalysis, Routine w reflex microscopic     Status: Abnormal   Collection Time: 09/04/17 12:39 PM  Result Value Ref Range   Color, Urine YELLOW YELLOW   APPearance CLEAR CLEAR   Specific Gravity, Urine 1.010 1.005 - 1.030   pH 7.0 5.0 - 8.0   Glucose, UA NEGATIVE NEGATIVE mg/dL   Hgb urine dipstick NEGATIVE NEGATIVE   Bilirubin Urine NEGATIVE NEGATIVE   Ketones, ur 20 (A) NEGATIVE mg/dL   Protein, ur NEGATIVE NEGATIVE mg/dL   Nitrite NEGATIVE NEGATIVE   Leukocytes, UA TRACE (A) NEGATIVE   RBC / HPF 0-5 0 - 5 RBC/hpf   WBC, UA  0-5 0 - 5 WBC/hpf   Bacteria, UA NONE SEEN NONE SEEN   Squamous Epithelial / LPF 0-5 (A) NONE SEEN   Mucus PRESENT   Wet prep, genital     Status: Abnormal   Collection Time: 09/04/17  1:22 PM  Result Value Ref Range   Yeast Wet Prep HPF POC PRESENT (A) NONE SEEN   Trich, Wet Prep NONE SEEN NONE SEEN   Clue Cells Wet Prep HPF POC NONE SEEN NONE SEEN   WBC, Wet Prep HPF POC MANY (A) NONE SEEN   Sperm NONE SEEN   CBC     Status: Abnormal   Collection Time: 09/04/17  2:00 PM  Result Value Ref Range   WBC 6.3 4.0 - 10.5 K/uL   RBC 4.09 3.87 - 5.11 MIL/uL   Hemoglobin 9.8 (L) 12.0 - 15.0 g/dL   HCT 16.131.3 (L) 09.636.0 - 04.546.0 %   MCV 76.5 (L) 78.0 - 100.0 fL   MCH 24.0 (L) 26.0 - 34.0 pg   MCHC 31.3 30.0 - 36.0 g/dL   RDW 40.915.0 81.111.5 - 91.415.5 %   Platelets 201 150 - 400 K/uL  ABO/Rh     Status: None   Collection Time: 09/04/17  2:00 PM  Result Value Ref Range   ABO/RH(D) A NEG    Assessment and Plan  Vaginal pain - Reassurance given that vaginal pain could be result of baby lightening in pelvis, but normal process in pregnancy  No prenatal care in current pregnancy in third trimester  - Keep scheduled appointment on Wednesday 09/06/2017  Candida vaginitis - Rx for Terazol 7 1 applicatorful hs p.v. x 5 days   Discharge home Patient verbalized an understanding of the plan of care and agrees.   Mary Moraolitta Siah Kannan, MSN, CNM 09/04/2017, 1:30 PM

## 2017-09-04 NOTE — MAU Note (Signed)
Pt arrived EMS. Stated she was walking her daughter to school today and started having sharp shooting pains in her vagina. Having difficulty walking. Has had no prenatal care. Denies and vaginal bleeding or leaking.

## 2017-09-05 LAB — GC/CHLAMYDIA PROBE AMP (~~LOC~~) NOT AT ARMC
Chlamydia: NEGATIVE
Neisseria Gonorrhea: NEGATIVE

## 2017-09-05 LAB — HIV ANTIBODY (ROUTINE TESTING W REFLEX): HIV SCREEN 4TH GENERATION: NONREACTIVE

## 2017-09-05 LAB — RPR: RPR Ser Ql: NONREACTIVE

## 2017-09-05 LAB — RUBELLA SCREEN: RUBELLA: 1.52 {index} (ref >0.99)

## 2017-09-07 ENCOUNTER — Encounter: Payer: Self-pay | Admitting: Advanced Practice Midwife

## 2017-09-19 NOTE — L&D Delivery Note (Signed)
Delivery Note At 5:01 PM a viable female was delivered via  (Presentation: ROA).  APGAR: 8, 9; weight pending.   Placenta status: delivered intact via Tomasa BlaseSchultz. 3VCord:  with the following complications: none.  Cord pH: n/a  Anesthesia: epidural   Episiotomy: None Lacerations: superficial labial and perineal-hemostatic Est. Blood Loss (mL): 100   Mom to postpartum.  Baby to Couplet care / Skin to Skin.  Eppie GibsonBen Bortner, MS3 10/07/2017, 5:17 PM  Midwife attestation: I was gloved and assisted this delivery in its entirety and I agree with the above student's note.  Donette LarryMelanie Leila Schuff, CNM 5:21 PM

## 2017-09-28 ENCOUNTER — Encounter: Payer: Self-pay | Admitting: Obstetrics and Gynecology

## 2017-09-29 ENCOUNTER — Encounter: Payer: Self-pay | Admitting: General Practice

## 2017-09-30 ENCOUNTER — Other Ambulatory Visit: Payer: Self-pay

## 2017-09-30 ENCOUNTER — Inpatient Hospital Stay (HOSPITAL_COMMUNITY)
Admission: AD | Admit: 2017-09-30 | Discharge: 2017-09-30 | Disposition: A | Discharge status: Home / Self Care | Payer: Medicaid Other | Source: Ambulatory Visit | Attending: Obstetrics and Gynecology | Admitting: Obstetrics and Gynecology

## 2017-09-30 ENCOUNTER — Encounter (HOSPITAL_COMMUNITY): Payer: Self-pay | Admitting: *Deleted

## 2017-09-30 DIAGNOSIS — Z3A39 39 weeks gestation of pregnancy: Secondary | ICD-10-CM | POA: Insufficient documentation

## 2017-09-30 DIAGNOSIS — Z3483 Encounter for supervision of other normal pregnancy, third trimester: Secondary | ICD-10-CM | POA: Diagnosis present

## 2017-09-30 DIAGNOSIS — O479 False labor, unspecified: Secondary | ICD-10-CM

## 2017-09-30 NOTE — Discharge Instructions (Signed)
Braxton Hicks Contractions °Contractions of the uterus can occur throughout pregnancy, but they are not always a sign that you are in labor. You may have practice contractions called Braxton Hicks contractions. These false labor contractions are sometimes confused with true labor. °What are Braxton Hicks contractions? °Braxton Hicks contractions are tightening movements that occur in the muscles of the uterus before labor. Unlike true labor contractions, these contractions do not result in opening (dilation) and thinning of the cervix. Toward the end of pregnancy (32-34 weeks), Braxton Hicks contractions can happen more often and may become stronger. These contractions are sometimes difficult to tell apart from true labor because they can be very uncomfortable. You should not feel embarrassed if you go to the hospital with false labor. °Sometimes, the only way to tell if you are in true labor is for your health care provider to look for changes in the cervix. The health care provider will do a physical exam and may monitor your contractions. If you are not in true labor, the exam should show that your cervix is not dilating and your water has not broken. °If there are other health problems associated with your pregnancy, it is completely safe for you to be sent home with false labor. You may continue to have Braxton Hicks contractions until you go into true labor. °How to tell the difference between true labor and false labor °True labor °· Contractions last 30-70 seconds. °· Contractions become very regular. °· Discomfort is usually felt in the top of the uterus, and it spreads to the lower abdomen and low back. °· Contractions do not go away with walking. °· Contractions usually become more intense and increase in frequency. °· The cervix dilates and gets thinner. °False labor °· Contractions are usually shorter and not as strong as true labor contractions. °· Contractions are usually irregular. °· Contractions  are often felt in the front of the lower abdomen and in the groin. °· Contractions may go away when you walk around or change positions while lying down. °· Contractions get weaker and are shorter-lasting as time goes on. °· The cervix usually does not dilate or become thin. °Follow these instructions at home: °· Take over-the-counter and prescription medicines only as told by your health care provider. °· Keep up with your usual exercises and follow other instructions from your health care provider. °· Eat and drink lightly if you think you are going into labor. °· If Braxton Hicks contractions are making you uncomfortable: °? Change your position from lying down or resting to walking, or change from walking to resting. °? Sit and rest in a tub of warm water. °? Drink enough fluid to keep your urine pale yellow. Dehydration may cause these contractions. °? Do slow and deep breathing several times an hour. °· Keep all follow-up prenatal visits as told by your health care provider. This is important. °Contact a health care provider if: °· You have a fever. °· You have continuous pain in your abdomen. °Get help right away if: °· Your contractions become stronger, more regular, and closer together. °· You have fluid leaking or gushing from your vagina. °· You pass blood-tinged mucus (bloody show). °· You have bleeding from your vagina. °· You have low back pain that you never had before. °· You feel your baby’s head pushing down and causing pelvic pressure. °· Your baby is not moving inside you as much as it used to. °Summary °· Contractions that occur before labor are called Braxton   Hicks contractions, false labor, or practice contractions. °· Braxton Hicks contractions are usually shorter, weaker, farther apart, and less regular than true labor contractions. True labor contractions usually become progressively stronger and regular and they become more frequent. °· Manage discomfort from Braxton Hicks contractions by  changing position, resting in a warm bath, drinking plenty of water, or practicing deep breathing. °This information is not intended to replace advice given to you by your health care provider. Make sure you discuss any questions you have with your health care provider. °Document Released: 01/19/2017 Document Revised: 01/19/2017 Document Reviewed: 01/19/2017 °Elsevier Interactive Patient Education © 2018 [employer] ° °

## 2017-09-30 NOTE — MAU Note (Signed)
Pt presents with c/o ctxs since 0200, reports ctxs have spaced out, now having lower back and vaginal pain.  Denies VB or LOF.  Reports +FM.

## 2017-10-07 ENCOUNTER — Encounter (HOSPITAL_COMMUNITY): Payer: Self-pay

## 2017-10-07 ENCOUNTER — Inpatient Hospital Stay (HOSPITAL_COMMUNITY): Payer: Medicaid Other | Admitting: Anesthesiology

## 2017-10-07 ENCOUNTER — Inpatient Hospital Stay (HOSPITAL_COMMUNITY)
Admission: AD | Admit: 2017-10-07 | Discharge: 2017-10-09 | DRG: 807 | Disposition: A | Discharge status: Home / Self Care | Payer: Medicaid Other | Source: Ambulatory Visit | Attending: Obstetrics and Gynecology | Admitting: Obstetrics and Gynecology

## 2017-10-07 DIAGNOSIS — D649 Anemia, unspecified: Secondary | ICD-10-CM | POA: Diagnosis present

## 2017-10-07 DIAGNOSIS — O9902 Anemia complicating childbirth: Secondary | ICD-10-CM | POA: Diagnosis present

## 2017-10-07 DIAGNOSIS — Z6791 Unspecified blood type, Rh negative: Secondary | ICD-10-CM | POA: Diagnosis not present

## 2017-10-07 DIAGNOSIS — F17210 Nicotine dependence, cigarettes, uncomplicated: Secondary | ICD-10-CM | POA: Diagnosis present

## 2017-10-07 DIAGNOSIS — L299 Pruritus, unspecified: Secondary | ICD-10-CM | POA: Diagnosis present

## 2017-10-07 DIAGNOSIS — Z3A39 39 weeks gestation of pregnancy: Secondary | ICD-10-CM

## 2017-10-07 DIAGNOSIS — J4520 Mild intermittent asthma, uncomplicated: Secondary | ICD-10-CM | POA: Diagnosis present

## 2017-10-07 DIAGNOSIS — O99334 Smoking (tobacco) complicating childbirth: Secondary | ICD-10-CM | POA: Diagnosis present

## 2017-10-07 DIAGNOSIS — O26893 Other specified pregnancy related conditions, third trimester: Secondary | ICD-10-CM | POA: Diagnosis present

## 2017-10-07 DIAGNOSIS — O9952 Diseases of the respiratory system complicating childbirth: Secondary | ICD-10-CM | POA: Diagnosis present

## 2017-10-07 DIAGNOSIS — Z3483 Encounter for supervision of other normal pregnancy, third trimester: Secondary | ICD-10-CM | POA: Diagnosis present

## 2017-10-07 LAB — RAPID URINE DRUG SCREEN, HOSP PERFORMED
Amphetamines: NOT DETECTED
BARBITURATES: NOT DETECTED
Benzodiazepines: NOT DETECTED
Cocaine: NOT DETECTED
OPIATES: NOT DETECTED
TETRAHYDROCANNABINOL: NOT DETECTED

## 2017-10-07 LAB — HEPATITIS B SURFACE ANTIGEN: Hepatitis B Surface Ag: NEGATIVE

## 2017-10-07 LAB — TYPE AND SCREEN
ABO/RH(D): A NEG
ANTIBODY SCREEN: NEGATIVE

## 2017-10-07 LAB — CBC
HCT: 30.4 % — ABNORMAL LOW (ref 36.0–46.0)
Hemoglobin: 10.1 g/dL — ABNORMAL LOW (ref 12.0–15.0)
MCH: 24.7 pg — AB (ref 26.0–34.0)
MCHC: 33.2 g/dL (ref 30.0–36.0)
MCV: 74.3 fL — ABNORMAL LOW (ref 78.0–100.0)
PLATELETS: 243 10*3/uL (ref 150–400)
RBC: 4.09 MIL/uL (ref 3.87–5.11)
RDW: 15.3 % (ref 11.5–15.5)
WBC: 7.1 10*3/uL (ref 4.0–10.5)

## 2017-10-07 LAB — GROUP B STREP BY PCR: GROUP B STREP BY PCR: POSITIVE — AB

## 2017-10-07 MED ORDER — OXYTOCIN BOLUS FROM INFUSION: 500.0000 mL | Freq: Once | INTRAVENOUS | Status: DC

## 2017-10-07 MED ORDER — SODIUM CHLORIDE 0.9 % IV SOLN
2.0000 g | Freq: Once | INTRAVENOUS | Status: AC
  Administered 2017-10-07: 2 g via INTRAVENOUS
  Filled 2017-10-07: qty 2000

## 2017-10-07 MED ORDER — PRENATAL MULTIVITAMIN CH
1.0000 | ORAL_TABLET | Freq: Every day | ORAL | Status: DC
  Administered 2017-10-08: 1 via ORAL
  Filled 2017-10-07 (×2): qty 1

## 2017-10-07 MED ORDER — WITCH HAZEL-GLYCERIN EX PADS
1.0000 "application " | MEDICATED_PAD | CUTANEOUS | Status: DC | PRN
  Administered 2017-10-09: 1 via TOPICAL

## 2017-10-07 MED ORDER — EPHEDRINE 5 MG/ML INJ
10.0000 mg | INTRAVENOUS | Status: DC | PRN
  Filled 2017-10-07: qty 2

## 2017-10-07 MED ORDER — LACTATED RINGERS IV SOLN
INTRAVENOUS | Status: DC
  Administered 2017-10-07 (×2): via INTRAVENOUS

## 2017-10-07 MED ORDER — ONDANSETRON HCL 4 MG/2ML IJ SOLN: 4.0000 mg | Freq: Four times a day (QID) | INTRAMUSCULAR | Status: DC | PRN

## 2017-10-07 MED ORDER — LACTATED RINGERS IV SOLN: 500.0000 mL | INTRAVENOUS | Status: DC | PRN

## 2017-10-07 MED ORDER — ACETAMINOPHEN 325 MG PO TABS
650.0000 mg | ORAL_TABLET | ORAL | Status: DC | PRN
Start: 2017-10-07 — End: 2017-10-09

## 2017-10-07 MED ORDER — SOD CITRATE-CITRIC ACID 500-334 MG/5ML PO SOLN: 30.0000 mL | ORAL | Status: DC | PRN

## 2017-10-07 MED ORDER — LIDOCAINE HCL (PF) 1 % IJ SOLN
INTRAMUSCULAR | Status: DC | PRN
  Administered 2017-10-07: 6 mL via EPIDURAL
  Administered 2017-10-07: 7 mL via EPIDURAL

## 2017-10-07 MED ORDER — LIDOCAINE HCL (PF) 1 % IJ SOLN
30.0000 mL | INTRAMUSCULAR | Status: DC | PRN
  Filled 2017-10-07: qty 30

## 2017-10-07 MED ORDER — COCONUT OIL OIL: 1.0000 "application " | TOPICAL_OIL | Status: DC | PRN

## 2017-10-07 MED ORDER — PHENYLEPHRINE 40 MCG/ML (10ML) SYRINGE FOR IV PUSH (FOR BLOOD PRESSURE SUPPORT)
80.0000 ug | PREFILLED_SYRINGE | INTRAVENOUS | Status: DC | PRN
  Filled 2017-10-07: qty 5

## 2017-10-07 MED ORDER — IBUPROFEN 600 MG PO TABS
600.0000 mg | ORAL_TABLET | Freq: Four times a day (QID) | ORAL | Status: DC
  Administered 2017-10-07 – 2017-10-09 (×8): 600 mg via ORAL
  Filled 2017-10-07 (×8): qty 1

## 2017-10-07 MED ORDER — DIBUCAINE 1 % RE OINT
1.0000 "application " | TOPICAL_OINTMENT | RECTAL | Status: DC | PRN
  Administered 2017-10-09: 1 via RECTAL
  Filled 2017-10-07: qty 28

## 2017-10-07 MED ORDER — OXYTOCIN 40 UNITS IN LACTATED RINGERS INFUSION - SIMPLE MED
INTRAVENOUS | Status: AC
  Filled 2017-10-07: qty 1000

## 2017-10-07 MED ORDER — OXYCODONE-ACETAMINOPHEN 5-325 MG PO TABS
2.0000 | ORAL_TABLET | ORAL | Status: DC | PRN
  Administered 2017-10-08 (×3): 2 via ORAL
  Filled 2017-10-07 (×3): qty 2

## 2017-10-07 MED ORDER — OXYTOCIN 40 UNITS IN LACTATED RINGERS INFUSION - SIMPLE MED
1.0000 m[IU]/min | INTRAVENOUS | Status: DC
  Administered 2017-10-07: 2 m[IU]/min via INTRAVENOUS

## 2017-10-07 MED ORDER — PHENYLEPHRINE 40 MCG/ML (10ML) SYRINGE FOR IV PUSH (FOR BLOOD PRESSURE SUPPORT)
80.0000 ug | PREFILLED_SYRINGE | INTRAVENOUS | Status: DC | PRN
  Filled 2017-10-07: qty 5
  Filled 2017-10-07: qty 10

## 2017-10-07 MED ORDER — ACETAMINOPHEN 325 MG PO TABS: 650.0000 mg | ORAL_TABLET | ORAL | Status: DC | PRN

## 2017-10-07 MED ORDER — ZOLPIDEM TARTRATE 5 MG PO TABS: 5.0000 mg | ORAL_TABLET | Freq: Every evening | ORAL | Status: DC | PRN

## 2017-10-07 MED ORDER — OXYCODONE-ACETAMINOPHEN 5-325 MG PO TABS
1.0000 | ORAL_TABLET | ORAL | Status: DC | PRN
  Administered 2017-10-07 – 2017-10-08 (×2): 1 via ORAL
  Filled 2017-10-07 (×3): qty 1

## 2017-10-07 MED ORDER — SIMETHICONE 80 MG PO CHEW
80.0000 mg | CHEWABLE_TABLET | ORAL | Status: DC | PRN
Start: 2017-10-07 — End: 2017-10-09

## 2017-10-07 MED ORDER — FENTANYL CITRATE (PF) 100 MCG/2ML IJ SOLN
INTRAMUSCULAR | Status: AC
  Administered 2017-10-07: 100 ug
  Filled 2017-10-07: qty 2

## 2017-10-07 MED ORDER — ONDANSETRON HCL 4 MG/2ML IJ SOLN
4.0000 mg | INTRAMUSCULAR | Status: DC | PRN
Start: 2017-10-07 — End: 2017-10-09

## 2017-10-07 MED ORDER — SENNOSIDES-DOCUSATE SODIUM 8.6-50 MG PO TABS
2.0000 | ORAL_TABLET | ORAL | Status: DC
  Administered 2017-10-07 – 2017-10-09 (×2): 2 via ORAL
  Filled 2017-10-07 (×2): qty 2

## 2017-10-07 MED ORDER — OXYTOCIN 10 UNIT/ML IJ SOLN
INTRAMUSCULAR | Status: AC
  Filled 2017-10-07: qty 1

## 2017-10-07 MED ORDER — TERBUTALINE SULFATE 1 MG/ML IJ SOLN
0.2500 mg | Freq: Once | INTRAMUSCULAR | Status: DC | PRN
  Filled 2017-10-07: qty 1

## 2017-10-07 MED ORDER — ONDANSETRON HCL 4 MG PO TABS: 4.0000 mg | ORAL_TABLET | ORAL | Status: DC | PRN

## 2017-10-07 MED ORDER — LIDOCAINE HCL (PF) 1 % IJ SOLN
INTRAMUSCULAR | Status: AC
  Filled 2017-10-07: qty 30

## 2017-10-07 MED ORDER — DIPHENHYDRAMINE HCL 25 MG PO CAPS
25.0000 mg | ORAL_CAPSULE | Freq: Four times a day (QID) | ORAL | Status: DC | PRN
  Administered 2017-10-08: 25 mg via ORAL
  Filled 2017-10-07: qty 1

## 2017-10-07 MED ORDER — TETANUS-DIPHTH-ACELL PERTUSSIS 5-2.5-18.5 LF-MCG/0.5 IM SUSP: 0.5000 mL | Freq: Once | INTRAMUSCULAR | Status: DC

## 2017-10-07 MED ORDER — FENTANYL CITRATE (PF) 100 MCG/2ML IJ SOLN: 50.0000 ug | INTRAMUSCULAR | Status: DC | PRN

## 2017-10-07 MED ORDER — FENTANYL 2.5 MCG/ML BUPIVACAINE 1/10 % EPIDURAL INFUSION (WH - ANES)
14.0000 mL/h | INTRAMUSCULAR | Status: DC | PRN
  Administered 2017-10-07: 14 mL/h via EPIDURAL
  Filled 2017-10-07: qty 100

## 2017-10-07 MED ORDER — BENZOCAINE-MENTHOL 20-0.5 % EX AERO
1.0000 "application " | INHALATION_SPRAY | CUTANEOUS | Status: DC | PRN
  Filled 2017-10-07: qty 56

## 2017-10-07 MED ORDER — DIPHENHYDRAMINE HCL 50 MG/ML IJ SOLN: 12.5000 mg | INTRAMUSCULAR | Status: DC | PRN

## 2017-10-07 MED ORDER — LACTATED RINGERS IV SOLN: 500.0000 mL | Freq: Once | INTRAVENOUS | Status: DC

## 2017-10-07 MED ORDER — OXYTOCIN 40 UNITS IN LACTATED RINGERS INFUSION - SIMPLE MED: 2.5000 [IU]/h | INTRAVENOUS | Status: DC

## 2017-10-07 NOTE — H&P (Signed)
OBSTETRIC ADMISSION HISTORY AND PHYSICAL  Mary Mitchell is a 22 y.o. female [phone] with IUP at [redacted]w[redacted]d by 22w [place] presenting for onset of contractions.  She reports that she first started feeling contractions this morning around 0300.  Contractions subsided and then intensified around 1000, at which point she decided to come to the hospital.  Contractions were 3 min apart at this time.  She reports +Fms.  She reports no LOF, no VB, no blurry vision, no headaches, no peripheral edema, no RUQ pain.  She plans on bottle feeding. She request nexplanon for birth control.  She had 1 prenatal care appointment at [facility].   Dating: By [place] --->  Estimated Date of Delivery: 10/10/17  Sono:  @[redacted]w[redacted]d , normal anatomy, breech presentation, 520g, 54% EFW   Prenatal History/Complications:  Past Medical History: Past Medical History:  Diagnosis Date  . ADHD (attention deficit hyperactivity disorder)   . Asthma   . Bipolar 1 disorder (HCC)   . PTSD (post-traumatic stress disorder)     Past Surgical History: Past Surgical History:  Procedure Laterality Date  . ABDOMINAL HERNIA REPAIR    . HERNIA REPAIR      Obstetrical History: OB History    Gravida Para Term Preterm AB Living   7 6 6  0 0 6   SAB TAB Ectopic Multiple Live Births   0 0 0 0 6      Social History: Social History   Socioeconomic History  . Marital status: Single    Spouse name: None  . Number of children: None  . Years of education: None  . Highest education level: None  Social Needs  . Financial resource strain: None  . Food insecurity - worry: None  . Food insecurity - inability: None  . Transportation needs - medical: None  . Transportation needs - non-medical: None  Occupational History  . None  Tobacco Use  . Smoking status: Light Tobacco Smoker    Packs/day: 0.25    Types: Cigarettes  . Smokeless tobacco: Current User  Substance and Sexual Activity  . Alcohol use: No  . Drug use: No  . Sexual activity: Not  Currently  Other Topics Concern  . None  Social History Narrative  . None    Family History: Family History  Problem Relation Age of Onset  . Diabetes Mother   . Hypertension Mother   . Diabetes Maternal Grandmother   . Hypertension Maternal Grandmother   . Heart disease Maternal Grandmother   . Cystic fibrosis Daughter     Allergies: No Known Allergies  Medications Prior to Admission  Medication Sig Dispense Refill Last Dose  . albuterol (PROVENTIL HFA;VENTOLIN HFA) 108 (90 Base) MCG/ACT inhaler Inhale 1-2 puffs into the lungs every 6 (six) hours as needed for wheezing or shortness of breath. 1 Inhaler 0 More than one month  . Prenatal Multivit-Min-Fe-FA (PRENATAL VITAMINS) 0.8 MG tablet Take 1 tablet by mouth daily. (Patient not taking: Reported on 09/30/2017) 30 tablet 12 Not Taking at Unknown time     Review of Systems   All systems reviewed and negative except as stated in HPI  Blood pressure 126/76, pulse 93, temperature 98.3 F (36.8 C), temperature source Oral, last menstrual period 01/03/2017, not currently breastfeeding. General appearance: alert, cooperative and laying in bed Abdomen: Firm uterus  Extremities: No calf tenderness, no sign of DVT Presentation: cephalic Fetal monitoringBaseline: 130 bpm, Variability: Good {> 6 bpm), Accelerations: Reactive and Decelerations: Absent Uterine activity Contractions every 3 minutes Dilation:  6 Effacement (%): 100 Station: -2 Exam by:: n druebbisch rn   Prenatal labs: ABO, Rh: --/--/A NEG (12/17 1400) Antibody:   Rubella: 1.52 (12/17 1400) RPR: Non Reactive (12/17 1400)  HBsAg: Negative (12/17 1400)  HIV: Non Reactive (12/17 1400)  GBS:   Unknown 1 hr GTT None Genetic screening  None Anatomy US: Small pericardial effusion most likely physiologic, otherwise normal  Prenatal Transfer Tool  Maternal Diabetes: No Genetic Screening: Declined Maternal Ultrasounds/Referrals: Small pericardial effusion most  likely physiologic.  Recommended repeat scan in 4 weeks which was not completed. Fetal Ultrasounds or other Referrals:  None Maternal Substance Abuse:  Yes:  Type: Smoker Significant Maternal Medications:  Meds include: Other:  Tylenol Significant Maternal Lab Results: None  No results found for this or any previous visit (from the past 24 hour(s)).  Patient Active Problem List   Diagnosis Date Noted  . Indication for care in labor and delivery, antepartum 10/07/2017  . Vaginal pain 09/04/2017  . Candida vaginitis 09/04/2017  . Normal labor 09/25/2016  . Grand multiparity 08/18/2016  . Short interval between pregnancies affecting pregnancy in third trimester, antepartum 08/18/2016  . History of shoulder dystocia in prior pregnancy, currently pregnant in third trimester 08/18/2016  . History of marijuana use 03/21/2016  . No prenatal care in current pregnancy in third trimester   . Rh negative state in antepartum period 01/31/2014  . Mild intermittent asthma 01/30/2014  . Tobacco abuse 01/30/2014    Assessment/Plan:  Mary Mitchell is a 22 y.o. G7P6006 at 2437w4d here for onset of contractions.  #Labor: Contractions started 0300 today.  Bedside US shows baby in cephalic position.  Expectant management for likely NSVD.   #Pain: IV pain medication now, plan for epidural  #FWB: Category I #ID:  GBS unknown, send GBS PCR #MOF: Bottle #MOC: Nexplanon #Circ:  Yes, outpatient  Mikal PlaneBenjamin A Jd Mccaster, Medical Student  10/07/2017, 12:56 PM

## 2017-10-07 NOTE — Anesthesia Preprocedure Evaluation (Signed)
Anesthesia Evaluation  Patient identified by MRN, date of birth, ID band Patient awake    Reviewed: Allergy & Precautions, H&P , NPO status , Patient's Chart, lab work & pertinent test results  Airway Mallampati: II  TM Distance: >3 FB Neck ROM: full    Dental no notable dental hx.    Pulmonary asthma , Current Smoker,    Pulmonary exam normal breath sounds clear to auscultation       Cardiovascular negative cardio ROS Normal cardiovascular exam Rhythm:regular Rate:Normal     Neuro/Psych negative neurological ROS     GI/Hepatic negative GI ROS, Neg liver ROS,   Endo/Other  negative endocrine ROS  Renal/GU negative Renal ROS  negative genitourinary   Musculoskeletal negative musculoskeletal ROS (+)   Abdominal (+) + obese,   Peds  Hematology negative hematology ROS (+)   Anesthesia Other Findings   Reproductive/Obstetrics (+) Pregnancy                             Anesthesia Physical  Anesthesia Plan  ASA: II  Anesthesia Plan: Epidural   Post-op Pain Management:    Induction:   PONV Risk Score and Plan:   Airway Management Planned:   Additional Equipment:   Intra-op Plan:   Post-operative Plan:   Informed Consent: I have reviewed the patients History and Physical, chart, labs and discussed the procedure including the risks, benefits and alternatives for the proposed anesthesia with the patient or authorized representative who has indicated his/her understanding and acceptance.     Plan Discussed with:   Anesthesia Plan Comments:         Anesthesia Quick Evaluation

## 2017-10-07 NOTE — Anesthesia Pain Management Evaluation Note (Signed)
  CRNA Pain Management Visit Note  Patient: Mary Mitchell, 22 y.o., female  "Hello I am a [facility] of the anesthesia team at [facility]. We have an anesthesia team available at all times to provide care throughout the hospital, including epidural management and anesthesia for C-section. I don't know your plan for the delivery whether it a natural birth, water birth, IV sedation, nitrous supplementation, doula or epidural, but we want to meet your pain goals."   1.Was your pain managed to your expectations on prior hospitalizations?   Yes   2.What is your expectation for pain management during this hospitalization?     Epidural  3.How can we help you reach that goal? Epidural. L&D RN aware of patient's desire for epidural. Platelets need to result beforehand. Patient is aware of this reason for the wait.  Record the patient's initial score and the patient's pain goal.   Pain: 5  Pain Goal: 5 [facility] wants you to be able to say your pain was always managed very well.  Muntaha Vermette L 10/07/2017

## 2017-10-07 NOTE — Anesthesia Procedure Notes (Signed)
Epidural Patient location during procedure: OB Start time: 10/07/2017 1:40 PM End time: 10/07/2017 1:43 PM  Staffing Anesthesiologist: Leilani AbleHatchett, Kala Ambriz, MD Performed: anesthesiologist   Preanesthetic Checklist Completed: patient identified, site marked, surgical consent, pre-op evaluation, timeout performed, IV checked, risks and benefits discussed and monitors and equipment checked  Epidural Patient position: sitting Prep: site prepped and draped and DuraPrep Patient monitoring: continuous pulse ox and blood pressure Approach: midline Location: L3-L4 Injection technique: LOR air  Needle:  Needle type: Tuohy  Needle gauge: 17 G Needle length: 9 cm and 9 Needle insertion depth: 6 cm Catheter type: closed end flexible Catheter size: 19 Gauge Catheter at skin depth: 11 cm Test dose: negative and Other  Assessment Sensory level: T9 Events: blood not aspirated, injection not painful, no injection resistance, negative IV test and no paresthesia  Additional Notes Reason for block:procedure for pain

## 2017-10-07 NOTE — MAU Note (Signed)
Ctx since last night, no bleeding, no LOF.

## 2017-10-07 NOTE — Anesthesia Postprocedure Evaluation (Signed)
Anesthesia Post Note  Patient: Mary Mitchell  Procedure(s) Performed: AN AD HOC LABOR EPIDURAL     Patient location during evaluation: Mother Baby Anesthesia Type: Epidural Level of consciousness: awake and alert and oriented Pain management: satisfactory to patient Vital Signs Assessment: post-procedure vital signs reviewed and stable Respiratory status: respiratory function stable Cardiovascular status: stable Postop Assessment: no headache, no backache, epidural receding, patient able to bend at knees, no signs of nausea or vomiting and adequate PO intake Anesthetic complications: no    Last Vitals:  Vitals:   10/07/17 1830 10/07/17 1930  BP: (!) 119/52 138/78  Pulse: 84 86  Resp: 16 18  Temp: 37.3 C 37.1 C  SpO2: 100%     Last Pain:  Vitals:   10/07/17 1933  TempSrc:   PainSc: 10-Worst pain ever   Pain Goal: Patients Stated Pain Goal: 2 (10/07/17 1830)               Karleen DolphinFUSSELL,Jacobi Nile

## 2017-10-07 NOTE — Progress Notes (Signed)
Labor Progress Note Lavinia SharpsCourtney M Stokely is a 22 y.o. G7P6006 at 4053w4d presented for spontaneous labor  S: Patient laying in bed.  Moderately uncomfortable after AROM.  No questions or complaints at this time.  O:  BP (!) 116/91   Pulse 97   Temp 98.3 F (36.8 C) (Oral)   LMP 01/03/2017 (Within Days)   SpO2 100%  EFM: 130 bpm, moderate variability, accels present, no decels  CVE: Dilation: (P) 7 Effacement (%): (P) 90 Cervical Position: Anterior Station: (P) -2 Presentation: (P) Vertex Exam by:: (P) Moss   A&P: 22 y.o. [id number] 8253w4d presented for spontaneous labor #Labor: AROM; moderate meconium.  Progressing well.  #Pain: Epidural placed #FWB: Category I #GBS unknown - PCR in process  Mikal PlaneBenjamin A Macedonio Scallon, Medical Student 2:27 PM

## 2017-10-08 LAB — HIV ANTIBODY (ROUTINE TESTING W REFLEX): HIV Screen 4th Generation wRfx: NONREACTIVE

## 2017-10-08 LAB — RPR: RPR: NONREACTIVE

## 2017-10-08 NOTE — Progress Notes (Signed)
Post Partum Day 1  Subjective: Up ad lib, voiding, tolerating PO, +flatus   Mentions increased pruritus and increased bleeding since last night No BMs   Objective: Blood pressure (!) 112/91, pulse 67, temperature 98.6 F (37 C), temperature source Oral, resp. rate 20, height 5\' 2"  (1.575 m), weight 90.9 kg (200 lb 6.4 oz), last menstrual period 01/03/2017, SpO2 100 %, not currently breastfeeding.  Physical Exam:  General: alert, cooperative and no distress Lochia: appropriate Uterine Fundus: firm Incision: NA DVT Evaluation: No evidence of DVT seen on physical exam.  Recent Labs    10/07/17 1228  HGB 10.1*  HCT 30.4*    Assessment/Plan: Patient is a 22 y.o. 717P7 female PPD#1 s/p SVD Plan for discharge tomorrow and Contraception Nexplanon   LOS: 1 day   Ilsa Ihaicole Jasmyn Picha PA-S2 10/08/2017, 7:42 AM

## 2017-10-08 NOTE — Clinical Social Work Note (Signed)
LCSW attempted to meet with [name] but she was in the shower.  Great grandmother at bedside.  LCSW will follow up in the am   

## 2017-10-09 ENCOUNTER — Encounter (HOSPITAL_COMMUNITY): Payer: Self-pay | Admitting: *Deleted

## 2017-10-09 ENCOUNTER — Other Ambulatory Visit: Payer: Self-pay

## 2017-10-09 MED ORDER — OXYCODONE-ACETAMINOPHEN 5-325 MG PO TABS: 1.0000 | ORAL_TABLET | ORAL | 0 refills | Status: DC | PRN

## 2017-10-09 NOTE — Plan of Care (Signed)
Patient had a good night, dad helped to take care of the baby so mom could get some rest

## 2017-10-09 NOTE — Discharge Summary (Signed)
OB Discharge Summary     Patient Name: Mary Mitchell DOB: Jun 03, 1996 MRN: [id number]  Date of admission: 10/07/2017 Delivering MD: Scherrie MerrittsBORTNER, BENJAMIN A   Date of discharge: 10/09/2017  Admitting diagnosis: 39.5 WKS, LABOR Intrauterine pregnancy: 5667w6d     Secondary diagnosis:  Principal Problem:   Vaginal delivery Active Problems:   Indication for care in labor and delivery, antepartum  Additional problems: No prenatal care     Discharge diagnosis: Term Pregnancy Delivered and Anemia                                                                                                Post partum procedures:none  Augmentation: AROM  Complications: None  Hospital course:  Onset of Labor With Vaginal Delivery     22 y.o. yo [id number] at 6367w6d was admitted in Active Labor on 10/07/2017. Patient had an uncomplicated labor course as follows:  Membrane Rupture Time/Date: 2:16 PM ,10/07/2017   Intrapartum Procedures: Episiotomy: None [1]                                         Lacerations:     Patient had a delivery of a Viable infant. 10/07/2017  Information for the patient's newborn:  Donnel SaxonHarrington, Boy Mikiya [[id number]][[id number]]  Delivery Method: Vaginal, Spontaneous(Filed from Delivery Summary)    Pateint had an uncomplicated postpartum course.  She is ambulating, tolerating a regular diet, passing flatus, and urinating well. Patient is discharged home in stable condition on 10/09/17.   Physical exam  Vitals:   10/07/17 1930 10/08/17 0533 10/08/17 0724 10/09/17 0300  BP: 138/78 (!) 112/91 133/86 (!) 138/92  Pulse: 86 67 79 85  Resp: 18 20 16 16   Temp: 98.7 F (37.1 C) 98.6 F (37 C) 98.4 F (36.9 C) 98.5 F (36.9 C)  TempSrc: Oral Oral Oral   SpO2:  100% 100% 100%  Weight:  200 lb 6.4 oz (90.9 kg)  201 lb 12.8 oz (91.5 kg)  Height:       General: alert, cooperative and no distress Lochia: appropriate Uterine Fundus: firm Incision: N/A DVT Evaluation: No evidence of DVT  seen on physical exam. No cords or calf tenderness. No significant calf/ankle edema. Labs: Lab Results  Component Value Date   WBC 7.1 10/07/2017   HGB 10.1 (L) 10/07/2017   HCT 30.4 (L) 10/07/2017   MCV 74.3 (L) 10/07/2017   PLT 243 10/07/2017   CMP Latest Ref Rng & Units 09/08/2016  Glucose 65 - 99 mg/dL 130(Q100(H)  BUN 6 - 20 mg/dL 5(L)  Creatinine 6.570.44 - 1.00 mg/dL 8.46(N0.39(L)  Sodium 629135 - 528145 mmol/L 135  Potassium 3.5 - 5.1 mmol/L 3.2(L)  Chloride 101 - 111 mmol/L 106  CO2 22 - 32 mmol/L 21(L)  Calcium 8.9 - 10.3 mg/dL 4.1(L8.5(L)  Total Protein 6.5 - 8.1 g/dL 7.1  Total Bilirubin 0.3 - 1.2 mg/dL 0.6  Alkaline Phos 38 - 126 U/L 117  AST 15 - 41 U/L 14(L)  ALT 14 -  54 U/L 6(L)    Discharge instruction: per After Visit Summary and "Baby and Me Booklet".  After visit meds:  Allergies as of 10/09/2017   No Known Allergies     Medication List    TAKE these medications   acetaminophen 325 MG tablet Commonly known as:  TYLENOL Take 325 mg by mouth every 6 (six) hours as needed for mild pain.   albuterol 108 (90 Base) MCG/ACT inhaler Commonly known as:  PROVENTIL HFA;VENTOLIN HFA Inhale 1-2 puffs into the lungs every 6 (six) hours as needed for wheezing or shortness of breath.   oxyCODONE-acetaminophen 5-325 MG tablet Commonly known as:  PERCOCET/ROXICET Take 1 tablet by mouth every 4 (four) hours as needed (for pain scale greater than or equal to 4 and less than 7).   Prenatal Vitamins 0.8 MG tablet Take 1 tablet by mouth daily.       Diet: routine diet  Activity: Advance as tolerated. Pelvic rest for 6 weeks.   Outpatient follow up:4 weeks  Follow up Appt:No future appointments. Follow up Visit:No Follow-up on file.  Postpartum contraception: Nexplanon  Newborn Data: Live born female  Birth Weight: 8 lb 2.3 oz (3695 g) APGAR: 8, 9  Newborn Delivery   Birth date/time:  10/07/2017 17:01:00 Delivery type:  Vaginal, Spontaneous     Baby Feeding:  Bottle Disposition:home with mother   10/09/2017 Sharyon Cable, CNM

## 2017-10-09 NOTE — Progress Notes (Signed)
CSW attempted to meet with [name] to complete assessment [facility] and [facility], but she was asleep at this time.  CSW will go see another patient before returning to attempt again.  CSW notified RN.

## 2017-10-09 NOTE — Clinical Social Work Maternal (Signed)
CLINICAL SOCIAL WORK MATERNAL/CHILD NOTE  Patient Details  Name: Mary Mitchell MRN: [id number] Date of Birth: Apr 04, 1996  Date:  10/09/2017  Clinical Social Worker Initiating Note:  Terri Piedra, [place] Date/Time: Initiated:  10/09/17/1400     Child's Name:  Mary Mitchell   Biological Parents:  Mother, Father(Mary Mitchell and Mary Mitchell)   Need for Interpreter:  None   Reason for Referral:  Behavioral Health Concerns, Late or No Prenatal Care (Bipolar)   Address:  [place] [address]    Phone number:  [phone] (home)     Additional phone number:   Household Members/Support Persons (HM/SP):   ([name] and FOB are married.  [name] has 7 children including baby.)   HM/SP Name Relationship DOB or Age  HM/SP -1        HM/SP -2        HM/SP -3        HM/SP -4        HM/SP -5        HM/SP -6        HM/SP -7        HM/SP -8          Natural Supports (not living in the home):  Friends, Immediate Family, Extended Family(Parents report that they have a great support system.  PGM is here with them today.)   Professional Supports: None   Employment:     Type of Work: [name] reports that she was working until she quit her job in December   Education:      Homebound arranged:    Museum/gallery curator Resources:  Medicaid   Other Resources:  [employer], Food Stamps    Cultural/Religious Considerations Which May Impact Care: None stated.  [name]'s facesheet notes religion as Non-Denominational.  Strengths:  Ability to meet basic needs , Home prepared for child , Pediatrician chosen   Psychotropic Medications:         Pediatrician:    [place] area  Pediatrician List:   Financial planner)  [place]      Pediatrician Fax Number:    Risk Factors/Current Problems:  None   Cognitive State:  Able to Concentrate , Alert    Mood/Affect:  Calm , Relaxed    CSW Assessment:  CSW met with parents and PGM in [name]'s first floor room to offer support and complete assessment due to hx of Bipolar and NPNC.  Parents were quiet, but welcoming, although seemed only minimally interested in meeting with CSW.  They report doing well and state they are ready to take baby home.  PGM was holding baby at this time.  [name] gave permission to speak openly with family present. [name] states labor and delivery went well and that this is her 7th child.  She states she has a great support system to help her with her children.  She and FOB are aware of SIDS precautions as reviewed by CSW and [name] states no hx of PMADs.  She states understanding that symptoms can arise after any birth and reports that she feels comfortable telling her doctor if she has concerns at any time.  She states she did not have [facility] because she found out about the pregnancy "late-approximately 4-5 months pregnant," and her job would not allow her to take time off for appointments.  She states, "every time I asked to be off,  they put me on the schedule."  Parents were understanding of hospital drug screen policy and [name] reports no substance use during pregnancy.  She acknowledges marijuana use prior to pregnancy and familiarity of hospital drug screen policy from other deliveries.   CSW asked [name] about the documentation of Bipolar, however, notes that it is documented by a previous CSW that [name] states she has been re-evaluated and found to not have Bipolar.  [name] rolled her eyes.  CSW informed [name] that CSW read where she feels this is an inaccurate dx, confirmed by re-evaluation and suggests that she show evaluation to her doctor or have evaluation scanned into her medical record to have this dx taken off of her "problem list."  [name] nodded.   [name] and baby have negative UDS on admission.  CSW will monitor CDS result and make report if warranted.   CSW Plan/Description:  No Further Intervention Required/No Barriers to Discharge, Sudden Infant  Death Syndrome (SIDS) Education, Perinatal Mood and Anxiety Disorder (PMADs) Education, [place], [place], [place] 10/09/2017, 3:34 PM

## 2017-10-10 NOTE — Addendum Note (Signed)
Addendum  created 10/10/17 1050 by Leilani AbleHatchett, Cannon Arreola, MD   Intraprocedure Staff edited

## 2017-10-12 ENCOUNTER — Encounter: Payer: Self-pay | Admitting: Obstetrics and Gynecology

## 2017-10-18 ENCOUNTER — Encounter: Payer: Self-pay | Admitting: Obstetrics and Gynecology

## 2017-10-18 ENCOUNTER — Ambulatory Visit: Payer: Self-pay | Admitting: Obstetrics and Gynecology

## 2017-10-20 IMAGING — US US MFM OB LIMITED
1 series · 15 of 28 positions shown · non-contrast
Comparison: none

[Series 1: us mfm ob limited · 41 acquisitions, 15 frames shown]
[im 1/41]
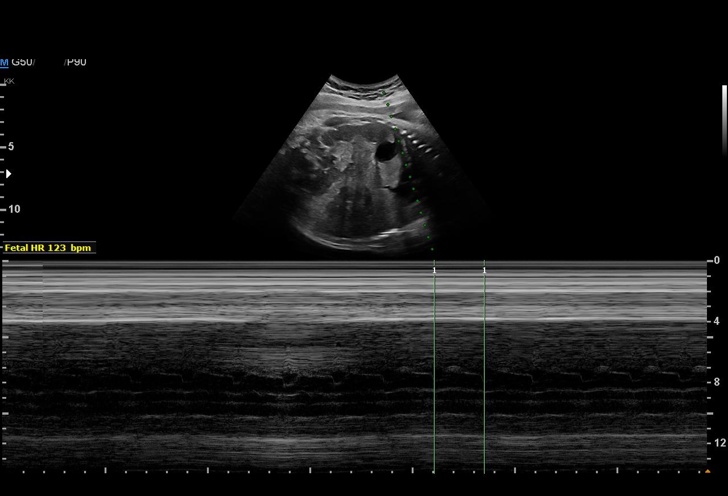
[im 3/41]
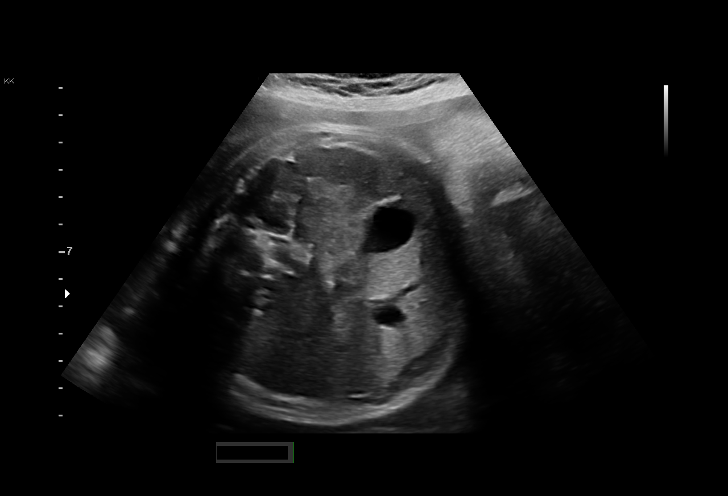
[im 6/41]
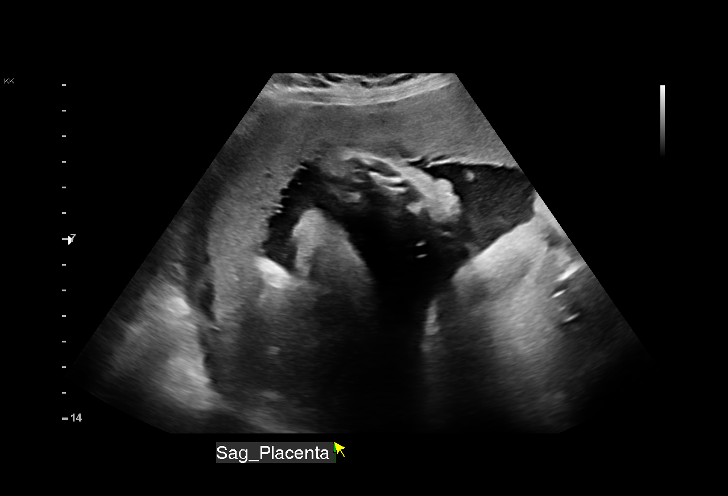
[im 9/41]
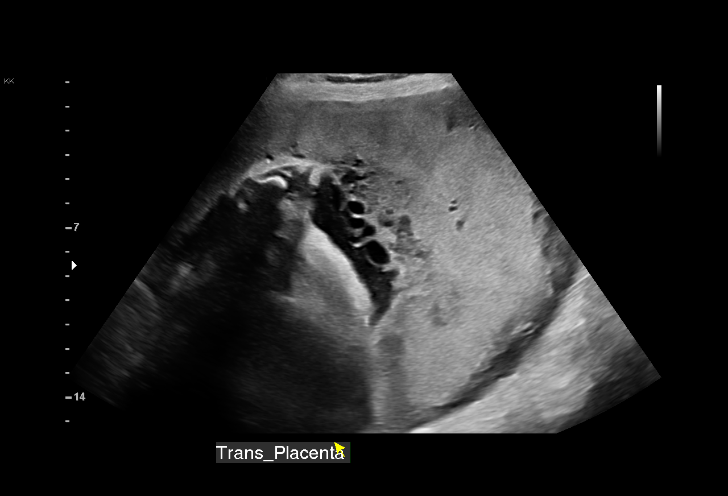
[im 12/41]
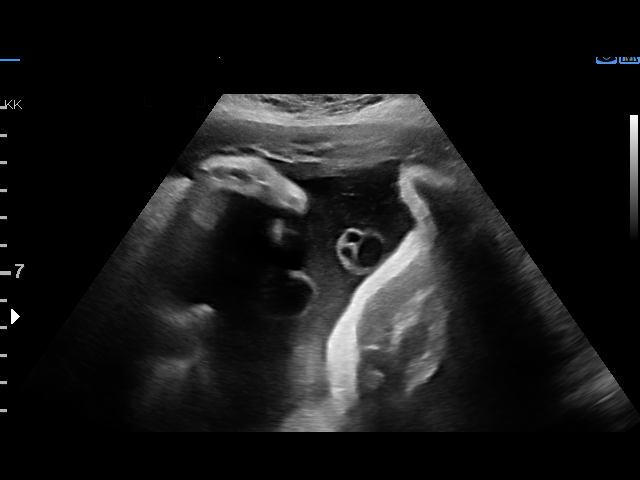
[im 15/41]
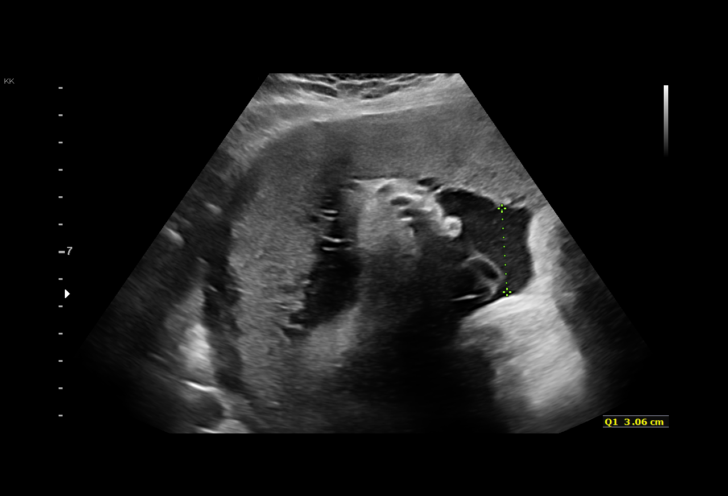
[im 18/41]
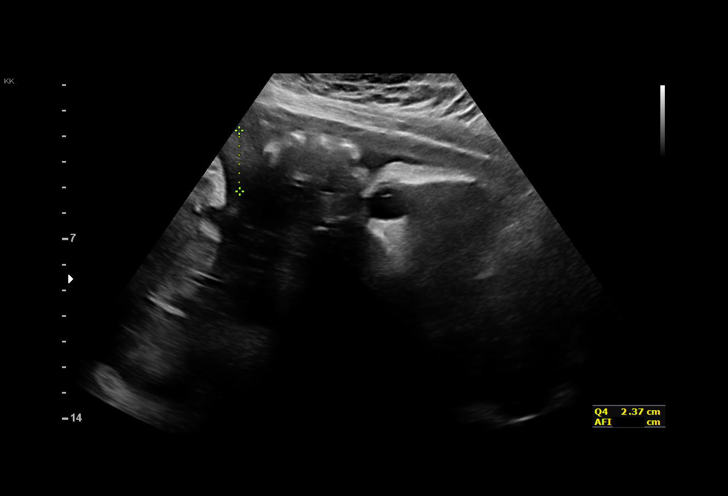
[im 21/41]
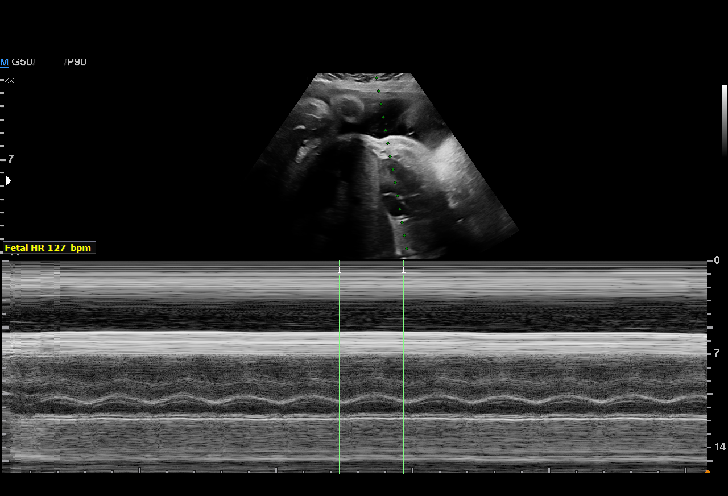
[im 23/41]
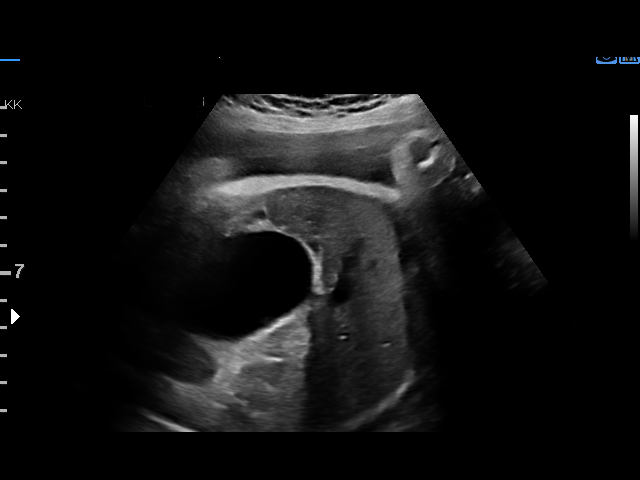
[im 26/41]
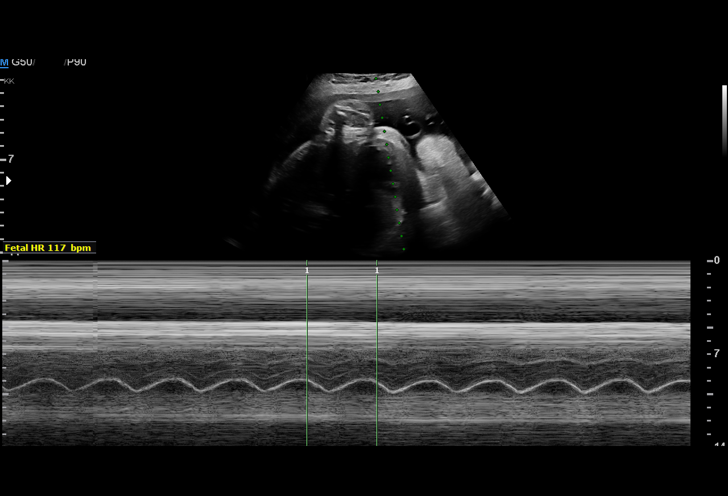
[im 29/41]
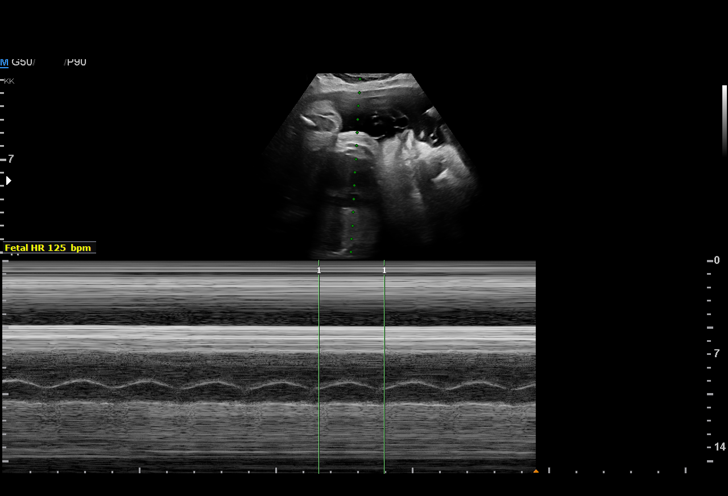
[im 32/41]
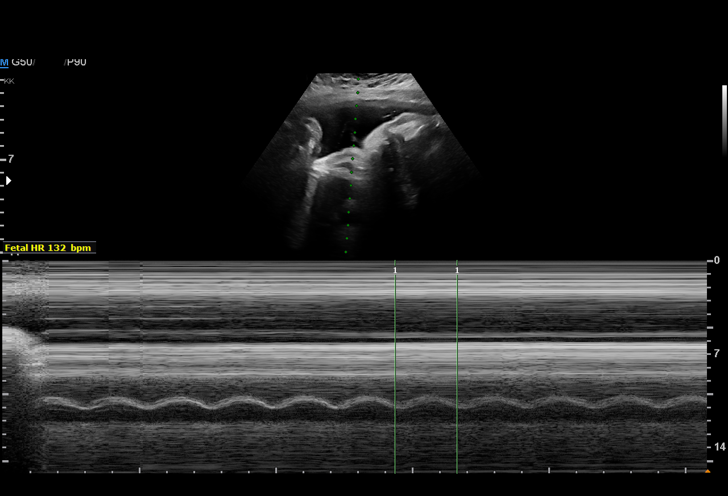
[im 35/41]
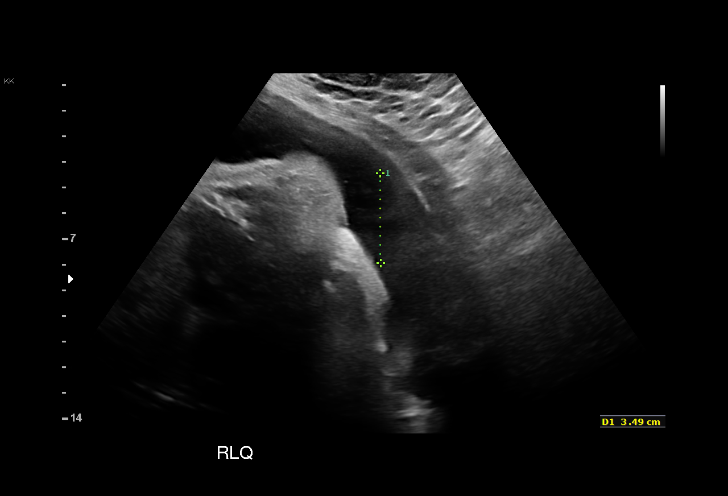
[im 38/41]
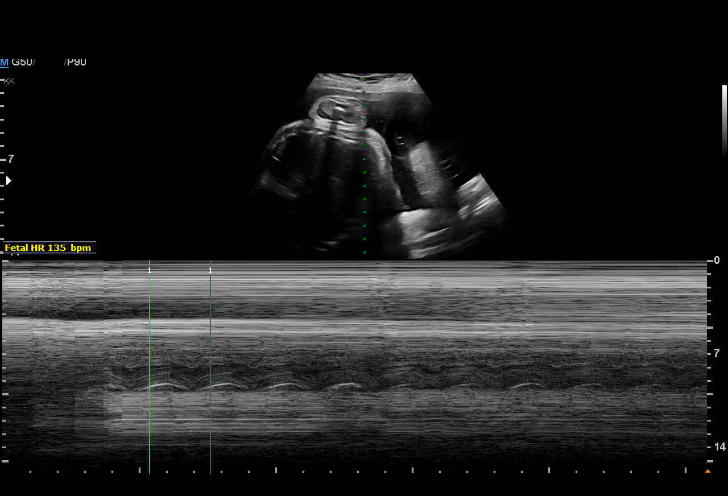
[im 41/41]
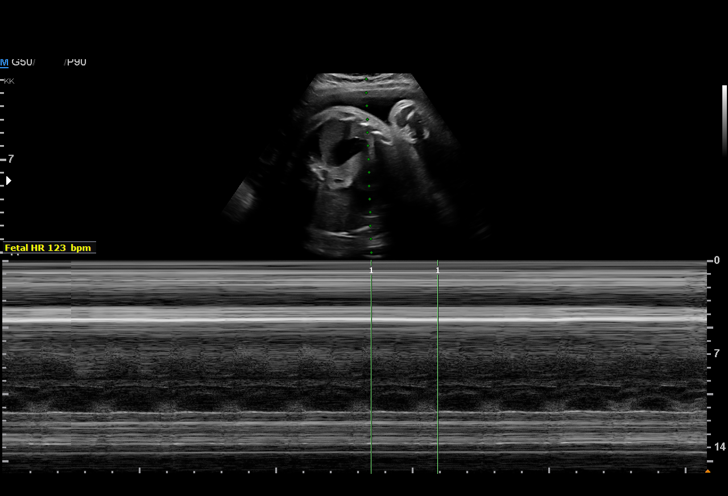

[15 of 28 positions shown; findings below may reference images not displayed]

[facility]

[facility]/Gyn Clinic
Referred By:      LEENDERT-JAN DUBBELDAM         Tertiary Phy.:    [facility]-
MAU/Triage

1  EDELINE CHAUCA          [id number]      [phone]     [phone]
Indications

37 weeks gestation of pregnancy
Drug use complicating pregnancy, second
trimester (+THC)
Poor obstetric history: Previous gestational
HTN
Poor obstetric history: Previous fetal growth
restriction (5lb 4oz at 41 wks)
Decreased fetal movement

OB History

Gravidity:    6         Term:   5        Prem:   0        SAB:   0
TOP:          0       Ectopic:  0        Living: 5
Fetal Evaluation

Num Of Fetuses:     1
Fetal Heart         123
Rate(bpm):
Cardiac Activity:   Observed
Presentation:       Cephalic
Placenta:           Left lateral, above cervical os
P. Cord Insertion:  Previously Visualized

Amniotic Fluid
AFI FV:      Subjectively within normal limits

AFI Sum(cm)     %Tile       Largest Pocket(cm)
12.1            40

RUQ(cm)       RLQ(cm)       LUQ(cm)        LLQ(cm)
3.3
Gestational Age

LMP:           46w 1d       Date:   10/21/15                 EDD:   07/27/16
Best:          37w 2d    Det. By:   U/S C R L  (03/03/16)    EDD:   09/27/16
Impression

IUP at 37+2 weeks with decreased fetal movement. AFI
requested as part of modified BPP
AFI
No evidence of fetal compromise
Recommendations

Follow-up ultrasounds as clinically indicated.

## 2017-11-10 ENCOUNTER — Ambulatory Visit: Payer: Self-pay | Admitting: Certified Nurse Midwife

## 2017-11-20 ENCOUNTER — Encounter: Payer: Self-pay | Admitting: *Deleted

## 2018-01-02 ENCOUNTER — Encounter: Payer: Self-pay | Admitting: *Deleted

## 2018-07-19 IMAGING — US US MFM OB COMP +14 WKS
1 series · 14 of 28 positions shown · non-contrast
Comparison: none

[Series 1: us mfm ob comp +14 wks · 14 of 71 slices shown]
[im 3/71]
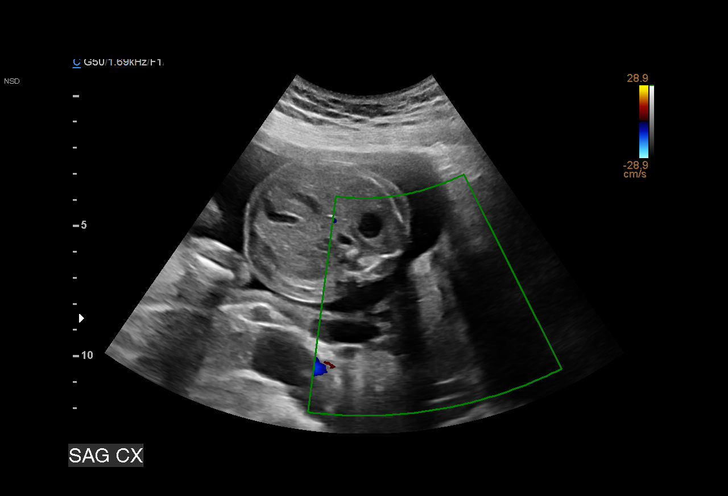
[im 8/71]
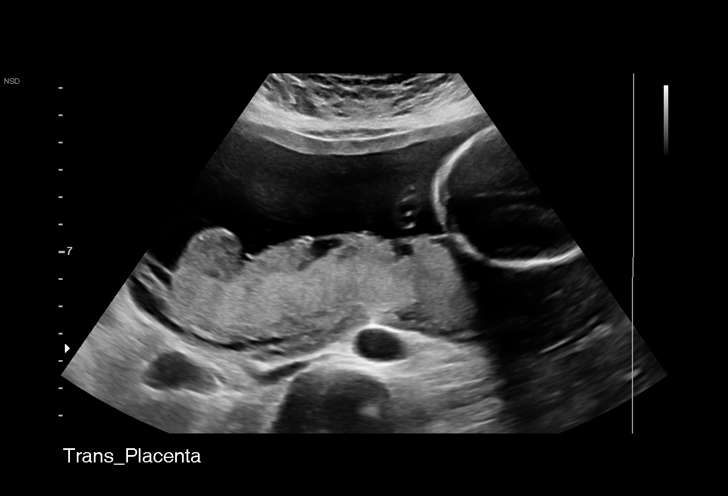
[im 13/71]
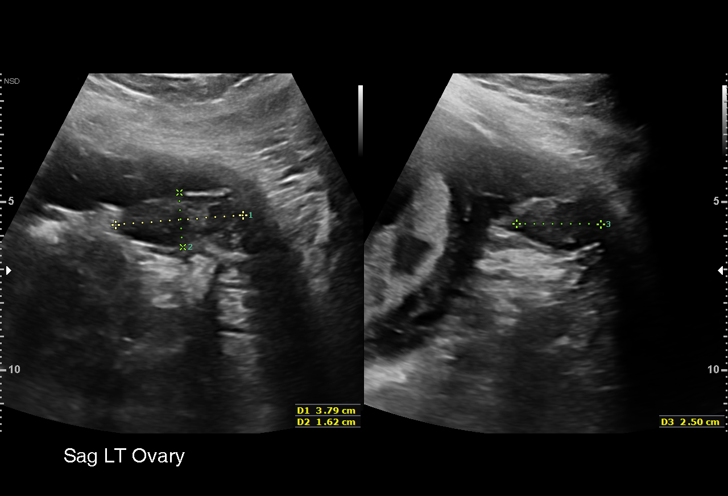
[im 19/71]
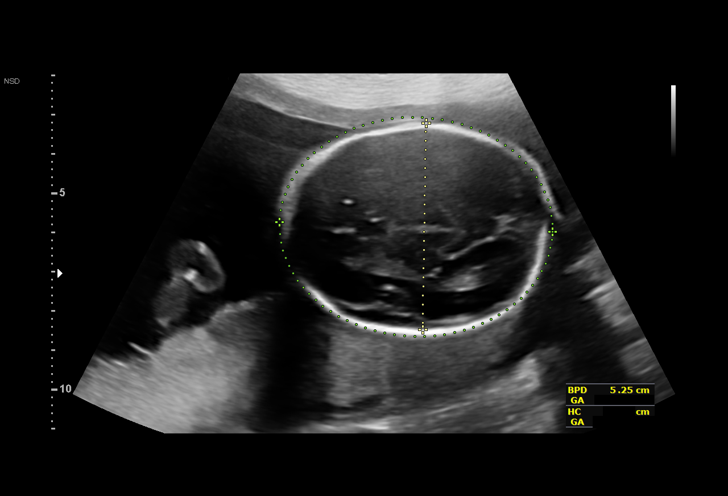
[im 24/71]
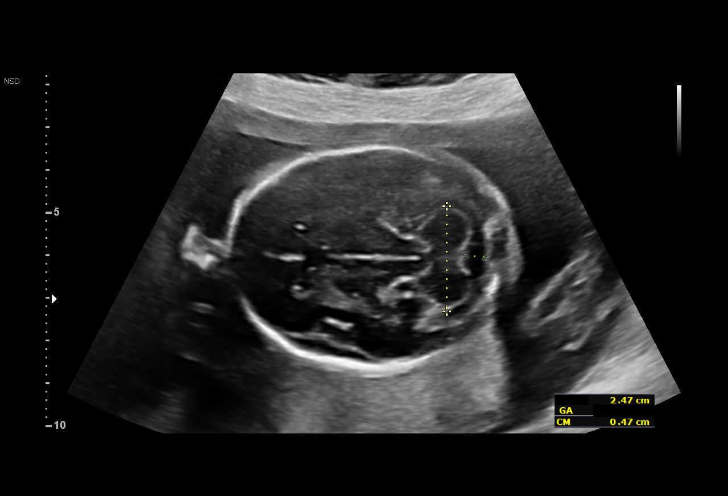
[im 29/71]
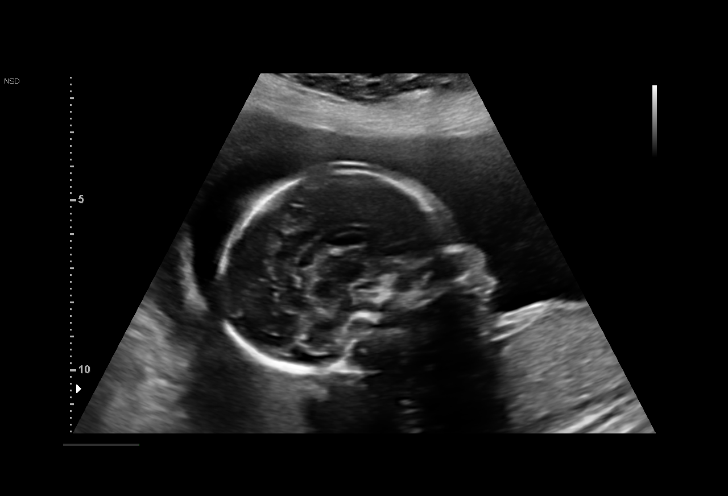
[im 34/71]
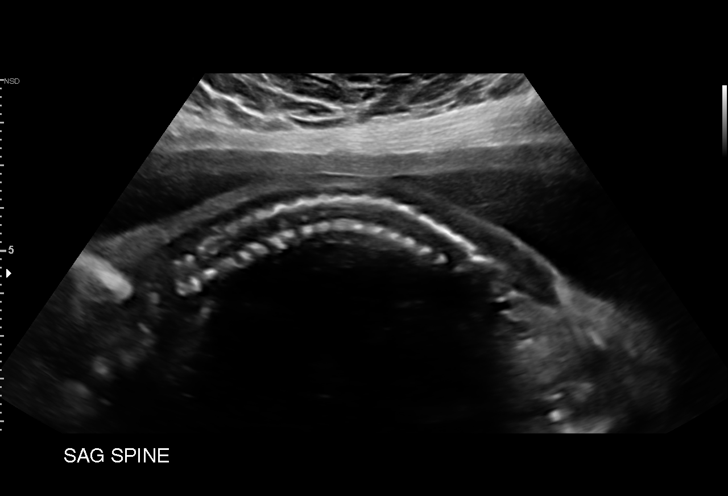
[im 39/71]
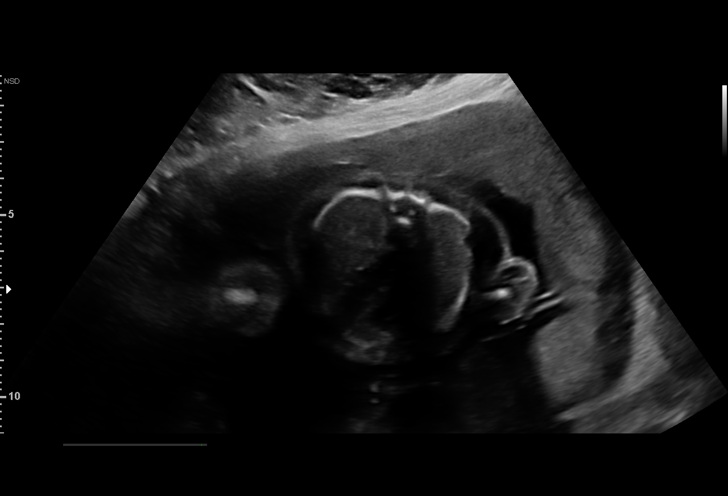
[im 45/71]
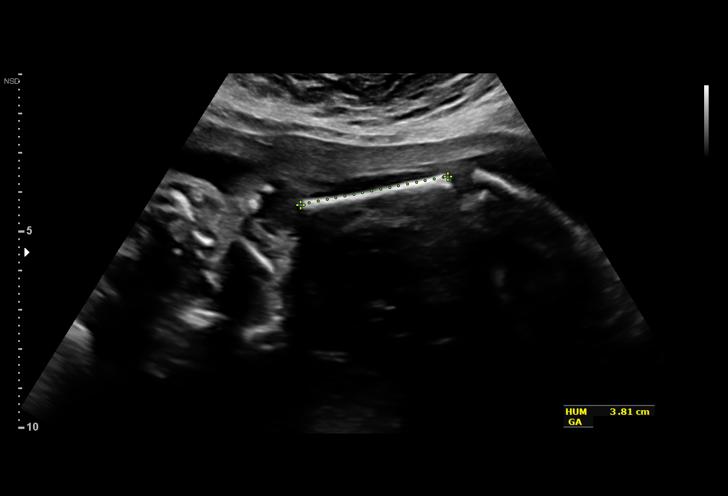
[im 50/71]
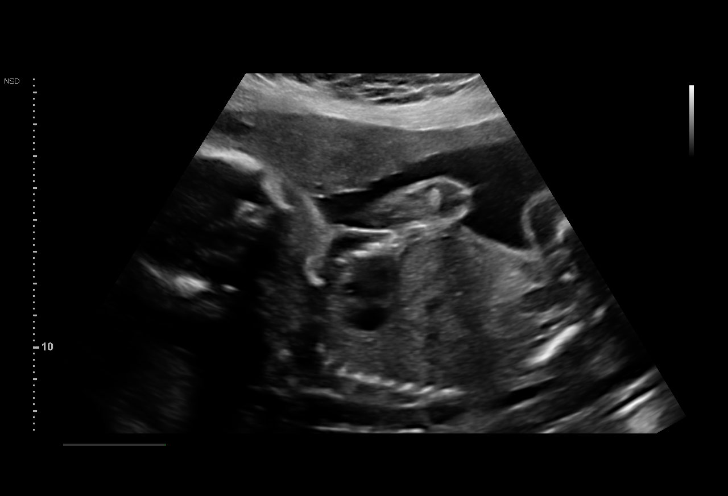
[im 55/71]
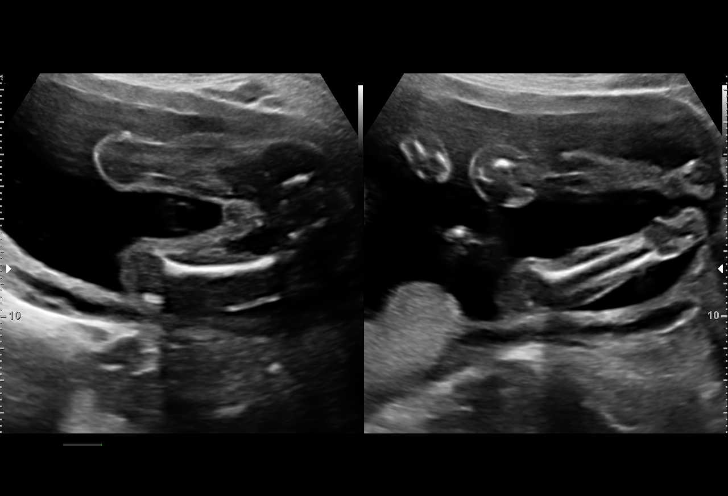
[im 60/71]
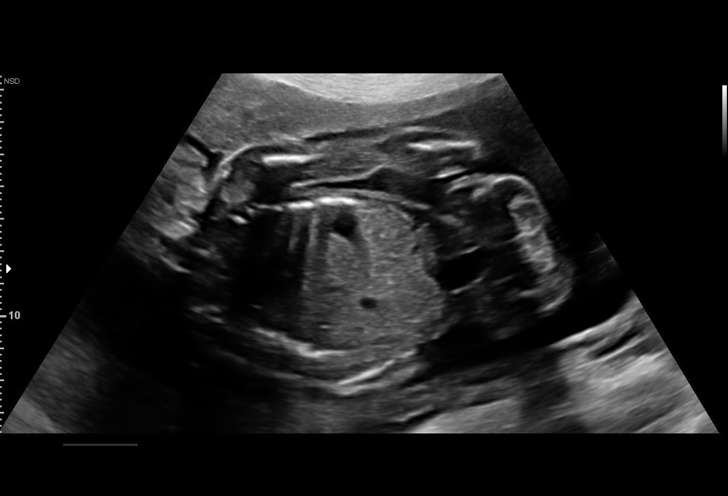
[im 65/71]
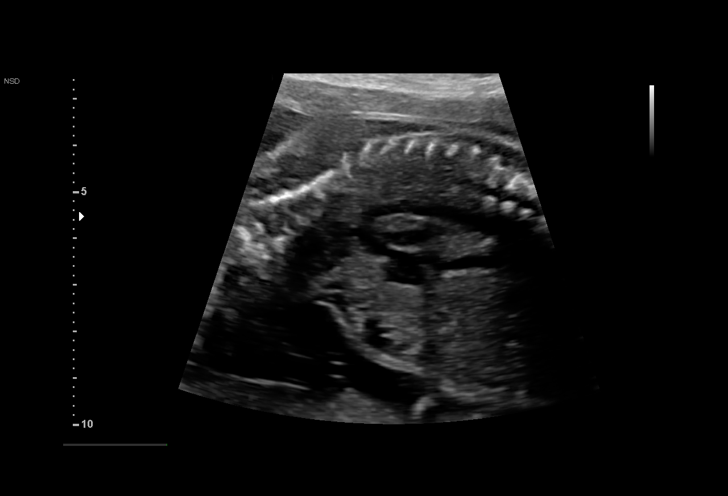
[im 71/71]
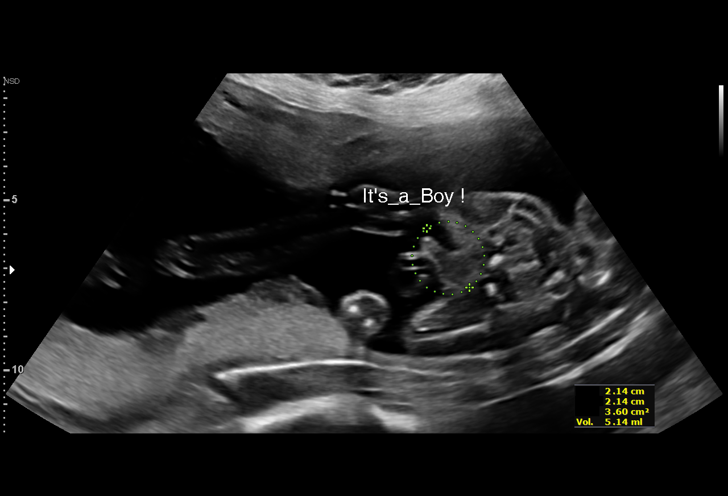

[14 of 28 positions shown; findings below may reference images not displayed]

[facility]

OB/Gyn Clinic

Indications

22 weeks gestation of pregnancy
Encounter for fetal anatomic survey
Short interval between pregancies, 2nd
trimester (Delivered 09-26-16)
Encounter for uncertain dates
Grand multiparity, antepartum
OB History

Gravidity:    7         Term:   6        Prem:   0        SAB:   0
TOP:          0       Ectopic:  0        Living: 6
Fetal Evaluation

Num Of Fetuses:     1
Fetal Heart         144
Rate(bpm):
Cardiac Activity:   Observed
Presentation:       Breech
Placenta:           Posterior, above cervical os
P. Cord Insertion:  Visualized, central

Amniotic Fluid
AFI FV:      Subjectively within normal limits

Largest Pocket(cm)
6
Biometry

BPD:      52.4  mm     G. Age:  21w 6d         32  %    CI:        71.39   %   70 - 86
FL/HC:      19.5   %   18.4 -
HC:      197.5  mm     G. Age:  22w 0d         24  %    HC/AC:      1.08       1.06 -
AC:      182.1  mm     G. Age:  23w 0d         66  %    FL/BPD:     73.5   %   71 - 87
FL:       38.5  mm     G. Age:  22w 2d         42  %    FL/AC:      21.1   %   20 - 24
HUM:      38.1  mm     G. Age:  23w 3d         75  %
CER:      24.7  mm     G. Age:  22w 5d         56  %
CM:        4.7  mm

Est. FW:     520  gm      1 lb 2 oz     54  %
Gestational Age

LMP:           22w 1d       Date:   01/03/17                 EDD:   10/10/17
U/S Today:     22w 2d                                        EDD:   10/09/17
Best:          22w 2d    Det. By:   U/S (06/07/17)           EDD:   10/09/17
Anatomy

Cranium:               Appears normal         Ductal Arch:            Not well visualized
Cavum:                 Appears normal         Diaphragm:              Appears normal
Ventricles:            Appears normal         Stomach:                Appears normal, left
sided
Choroid Plexus:        Appears normal         Abdomen:                Appears normal
Cerebellum:            Appears normal         Abdominal Wall:         Appears nml (cord
insert, abd wall)
Posterior Fossa:       Appears normal         Cord Vessels:           Appears normal (3
vessel cord)
Nuchal Fold:           Not applicable (>20    Kidneys:                Appear normal
wks GA)
Face:                  Appears normal         Bladder:                Appears normal
(orbits and profile)
Lips:                  Appears normal         Spine:                  Appears normal
RVOT:                  Not well visualized    Upper Extremities:      Visualized
LVOT:                  Not well visualized    Lower Extremities:      Appears normal
Aortic Arch:           Appears normal

Other:  Fetus appears to be a male. Technically difficult due to fetal position.
Cervix Uterus Adnexa

Cervix
Length:            3.5  cm.
Normal appearance by transabdominal scan.

Left Ovary
Within normal limits.

Right Ovary
Within normal limits.

Adnexa:       No abnormality visualized.
Impression

Singleton intrauterine pregnancy at 22+2 weeks with grand
multiparity and uncertain dates. Here for anatomic survey
Review of the anatomy shows no sonographic markers for
aneuploidy or structural anomalies
However, cardiac evaluations should be considered
suboptimal secondary to fetal position. In addition there is a
small (2.5mm) pericardial effusion that is most likely
physiologic
Amniotic fluid volume is normal
Estimated fetal weight is 520g which is growth in the 54th
percentile
Recommendations

Reepat scan in 4 weeks to reassess fetal cardiac anatomy

## 2019-01-17 ENCOUNTER — Encounter: Payer: Self-pay | Admitting: *Deleted

## 2019-02-27 ENCOUNTER — Encounter: Payer: Self-pay | Admitting: *Deleted

## 2019-04-06 ENCOUNTER — Other Ambulatory Visit: Payer: Self-pay | Admitting: Critical Care Medicine

## 2019-04-06 DIAGNOSIS — Z20822 Contact with and (suspected) exposure to covid-19: Secondary | ICD-10-CM

## 2019-04-10 LAB — NOVEL CORONAVIRUS, NAA: SARS-CoV-2, NAA: NOT DETECTED

## 2019-10-01 ENCOUNTER — Emergency Department (HOSPITAL_COMMUNITY)
Admission: EM | Admit: 2019-10-01 | Discharge: 2019-10-01 | Disposition: A | Discharge status: Home / Self Care | Payer: Self-pay | Attending: Emergency Medicine | Admitting: Emergency Medicine

## 2019-10-01 ENCOUNTER — Other Ambulatory Visit (INDEPENDENT_AMBULATORY_CARE_PROVIDER_SITE_OTHER): Payer: Self-pay

## 2019-10-01 ENCOUNTER — Other Ambulatory Visit: Payer: Self-pay

## 2019-10-01 ENCOUNTER — Encounter (HOSPITAL_COMMUNITY): Payer: Self-pay

## 2019-10-01 DIAGNOSIS — R112 Nausea with vomiting, unspecified: Secondary | ICD-10-CM | POA: Insufficient documentation

## 2019-10-01 DIAGNOSIS — B349 Viral infection, unspecified: Secondary | ICD-10-CM | POA: Insufficient documentation

## 2019-10-01 DIAGNOSIS — R109 Unspecified abdominal pain: Secondary | ICD-10-CM

## 2019-10-01 DIAGNOSIS — R519 Headache, unspecified: Secondary | ICD-10-CM

## 2019-10-01 DIAGNOSIS — R197 Diarrhea, unspecified: Secondary | ICD-10-CM | POA: Insufficient documentation

## 2019-10-01 DIAGNOSIS — R1033 Periumbilical pain: Secondary | ICD-10-CM | POA: Insufficient documentation

## 2019-10-01 DIAGNOSIS — J4520 Mild intermittent asthma, uncomplicated: Secondary | ICD-10-CM | POA: Insufficient documentation

## 2019-10-01 DIAGNOSIS — Z20822 Contact with and (suspected) exposure to covid-19: Secondary | ICD-10-CM | POA: Insufficient documentation

## 2019-10-01 DIAGNOSIS — F17210 Nicotine dependence, cigarettes, uncomplicated: Secondary | ICD-10-CM | POA: Insufficient documentation

## 2019-10-01 LAB — I-STAT BETA HCG BLOOD, ED (MC, WL, AP ONLY): I-stat hCG, quantitative: <5 m[IU]/mL (ref <5)

## 2019-10-01 LAB — CBC
HCT: 45.2 % (ref 36.0–46.0)
Hemoglobin: 13.6 g/dL (ref 12.0–15.0)
MCH: 24.5 pg — ABNORMAL LOW (ref 26.0–34.0)
MCHC: 30.1 g/dL (ref 30.0–36.0)
MCV: 81.4 fL (ref 80.0–100.0)
Platelets: 307 10*3/uL (ref 150–400)
RBC: 5.55 MIL/uL — ABNORMAL HIGH (ref 3.87–5.11)
RDW: 14.5 % (ref 11.5–15.5)
WBC: 7.9 10*3/uL (ref 4.0–10.5)
nRBC: 0 % (ref 0.0–0.2)

## 2019-10-01 LAB — COMPREHENSIVE METABOLIC PANEL
ALT: 33 U/L (ref 0–44)
AST: 23 U/L (ref 15–41)
Albumin: 4.1 g/dL (ref 3.5–5.0)
Alkaline Phosphatase: 133 U/L — ABNORMAL HIGH (ref 38–126)
Anion gap: 7 (ref 5–15)
BUN: 12 mg/dL (ref 6–20)
CO2: 27 mmol/L (ref 22–32)
Calcium: 9 mg/dL (ref 8.9–10.3)
Chloride: 104 mmol/L (ref 98–111)
Creatinine, Ser: 0.56 mg/dL (ref 0.44–1.00)
GFR calc Af Amer: >60 mL/min (ref >60)
GFR calc non Af Amer: >60 mL/min (ref >60)
Glucose, Bld: 108 mg/dL — ABNORMAL HIGH (ref 70–99)
Potassium: 4.3 mmol/L (ref 3.5–5.1)
Sodium: 138 mmol/L (ref 135–145)
Total Bilirubin: 0.5 mg/dL (ref 0.3–1.2)
Total Protein: 8.1 g/dL (ref 6.5–8.1)

## 2019-10-01 LAB — LIPASE, BLOOD: Lipase: 22 U/L (ref 11–51)

## 2019-10-01 MED ORDER — DICYCLOMINE HCL 20 MG PO TABS: 20.0000 mg | ORAL_TABLET | Freq: Two times a day (BID) | ORAL | 0 refills | Status: DC | PRN

## 2019-10-01 MED ORDER — KETOROLAC TROMETHAMINE 15 MG/ML IJ SOLN
15.0000 mg | Freq: Once | INTRAMUSCULAR | Status: AC
  Administered 2019-10-01: 15 mg via INTRAMUSCULAR
  Filled 2019-10-01: qty 1

## 2019-10-01 MED ORDER — DICYCLOMINE HCL 10 MG PO CAPS
10.0000 mg | ORAL_CAPSULE | Freq: Once | ORAL | Status: AC
  Administered 2019-10-01: 10 mg via ORAL
  Filled 2019-10-01: qty 1

## 2019-10-01 MED ORDER — NAPROXEN 500 MG PO TABS: 500.0000 mg | ORAL_TABLET | Freq: Two times a day (BID) | ORAL | 0 refills | Status: DC

## 2019-10-01 MED ORDER — ONDANSETRON HCL 4 MG PO TABS
4.0000 mg | ORAL_TABLET | Freq: Once | ORAL | Status: AC
Start: 2019-10-01 — End: 2019-10-01
  Administered 2019-10-01: 13:00:00 4 mg via ORAL
  Filled 2019-10-01: qty 1

## 2019-10-01 MED ORDER — ONDANSETRON HCL 4 MG PO TABS: 4.0000 mg | ORAL_TABLET | Freq: Three times a day (TID) | ORAL | 0 refills | Status: DC | PRN

## 2019-10-01 MED ORDER — SODIUM CHLORIDE 0.9% FLUSH: 3.0000 mL | Freq: Once | INTRAVENOUS | Status: DC

## 2019-10-01 NOTE — Discharge Instructions (Signed)
You were tested for coronavirus today.  If positive, you will receive a phone call.  If negative, you will not.  Either way, you may check online on MyChart. If you develop severe worsening pain, especially continues to the right lower side of your stomach, return to the emergency room for further evaluation. Treat symptomatically; use Bentyl as needed for abdominal pain or cramping, use naproxen as needed for headache, use Zofran as needed for nausea or vomiting. Make sure you are staying well-hydrated water. Return to the emergency room with any new, worsening, concerning symptoms.

## 2019-10-01 NOTE — ED Provider Notes (Signed)
[facility]-EMERGENCY DEPT Provider Note   CSN: [id number] Arrival date & time: 10/01/19  5465     History Chief Complaint  Patient presents with  . Headache  . Diarrhea    Mary Mitchell is a 24 y.o. female presenting for evaluation of headache, nausea, vomiting, diarrhea, abdominal cramping.  Patient states her symptoms began yesterday.  Began with a headache which she feels like a sinus pressure.  She took 2 Advil which mildly improved her symptoms.  Today she had 4 episodes of diarrhea, 2 episodes of vomiting.  She states she has periumbilical abdominal pain while she is having a bowel movement, but this resolves when a bowel movement is done.  She denies fevers, chills, cough, chest pain, shortness of breath, loss of taste or smell, sore throat, or urinary symptoms.  She has not taken anything for her symptoms today.  Patient denies sick contacts.  She states she was tested for Covid 1 month ago, has not been tested since.  She reports a history of asthma, does not take anything for it regularly.  She has not needed albuterol or felt short of breath since her symptoms began.  She has no other medical problems, takes no medications daily.  HPI     Past Medical History:  Diagnosis Date  . ADHD (attention deficit hyperactivity disorder)   . Asthma   . Bipolar 1 disorder (HCC)   . PTSD (post-traumatic stress disorder)     Patient Active Problem List   Diagnosis Date Noted  . Indication for care in labor and delivery, antepartum 10/07/2017  . Vaginal delivery 10/07/2017  . Vaginal pain 09/04/2017  . Candida vaginitis 09/04/2017  . Normal labor 09/25/2016  . Grand multiparity 08/18/2016  . Short interval between pregnancies affecting pregnancy in third trimester, antepartum 08/18/2016  . History of shoulder dystocia in prior pregnancy, currently pregnant in third trimester 08/18/2016  . History of marijuana use 03/21/2016  . No prenatal care in  current pregnancy in third trimester   . Rh negative state in antepartum period 01/31/2014  . Mild intermittent asthma 01/30/2014  . Tobacco abuse 01/30/2014    Past Surgical History:  Procedure Laterality Date  . ABDOMINAL HERNIA REPAIR    . HERNIA REPAIR       OB History    Gravida  7   Para  6   Term  6   Preterm  0   AB  0   Living  6     SAB  0   TAB  0   Ectopic  0   Multiple  0   Live Births  6           Family History  Problem Relation Age of Onset  . Diabetes Mother   . Hypertension Mother   . Diabetes Maternal Grandmother   . Hypertension Maternal Grandmother   . Heart disease Maternal Grandmother   . Cystic fibrosis Daughter     Social History   Tobacco Use  . Smoking status: Light Tobacco Smoker    Packs/day: 0.25    Types: Cigarettes  . Smokeless tobacco: Current User  Substance Use Topics  . Alcohol use: No  . Drug use: No    Home Medications Prior to Admission medications   Medication Sig Start Date End Date Taking? Authorizing Provider  acetaminophen (TYLENOL) 325 MG tablet Take 325 mg by mouth every 6 (six) hours as needed for mild pain.    [provider]  albuterol (PROVENTIL HFA;VENTOLIN HFA) 108 (90 Base) MCG/ACT inhaler Inhale 1-2 puffs into the lungs every 6 (six) hours as needed for wheezing or shortness of breath. 07/01/16   Jorje Guild, NP  dicyclomine (BENTYL) 20 MG tablet Take 1 tablet (20 mg total) by mouth 2 (two) times daily as needed for spasms. 10/01/19   De Libman, PA-C  naproxen (NAPROSYN) 500 MG tablet Take 1 tablet (500 mg total) by mouth 2 (two) times daily with a meal. 10/01/19   Karem Farha, PA-C  ondansetron (ZOFRAN) 4 MG tablet Take 1 tablet (4 mg total) by mouth every 8 (eight) hours as needed for nausea or vomiting. 10/01/19   Jarod Bozzo, PA-C  oxyCODONE-acetaminophen (PERCOCET/ROXICET) 5-325 MG tablet Take 1 tablet by mouth every 4 (four) hours as needed (for pain scale  greater than or equal to 4 and less than 7). 10/09/17   Lajean Manes, CNM  Prenatal Multivit-Min-Fe-FA (PRENATAL VITAMINS) 0.8 MG tablet Take 1 tablet by mouth daily. Patient not taking: Reported on 09/30/2017 05/30/17   Truett Mainland, DO    Allergies    Patient has no known allergies.  Review of Systems   Review of Systems  Gastrointestinal: Positive for abdominal pain, diarrhea, nausea and vomiting.  Neurological: Positive for headaches.  All other systems reviewed and are negative.   Physical Exam Updated Vital Signs BP 128/88   Pulse 88   Temp 98.7 F (37.1 C)   Resp 16   Wt 121.5 kg   LMP 09/01/2019   SpO2 99%   BMI 49.00 kg/m   Physical Exam Vitals and nursing note reviewed.  Constitutional:      General: She is not in acute distress.    Appearance: She is obese.     Comments: Obese female resting comfortably in the bed in no acute distress  HENT:     Head: Normocephalic and atraumatic.  Eyes:     Extraocular Movements: Extraocular movements intact.     Conjunctiva/sclera: Conjunctivae normal.     Pupils: Pupils are equal, round, and reactive to light.     Comments: EOMI and PERRLA  Cardiovascular:     Rate and Rhythm: Normal rate and regular rhythm.     Pulses: Normal pulses.  Pulmonary:     Effort: Pulmonary effort is normal. No respiratory distress.     Breath sounds: Normal breath sounds. No wheezing.  Abdominal:     General: There is no distension.     Palpations: Abdomen is soft. There is no mass.     Tenderness: There is abdominal tenderness. There is no guarding or rebound.     Comments: Generalized tenderness palpation the abdomen without rigidity, guarding, distention.  Negative rebound.  No signs of peritonitis.  No worsened periumbilical or right lower quadrant abdominal pain.  Musculoskeletal:        General: Normal range of motion.     Cervical back: Normal range of motion and neck supple.     Comments: Strength and sensation intact x4   Skin:    General: Skin is warm and dry.     Capillary Refill: Capillary refill takes less than 2 seconds.  Neurological:     Mental Status: She is alert and oriented to person, place, and time.     GCS: GCS eye subscore is 4. GCS verbal subscore is 5. GCS motor subscore is 6.     Sensory: Sensation is intact.     Motor: Motor function is intact.  ED Results / Procedures / Treatments   Labs (all labs ordered are listed, but only abnormal results are displayed) Labs Reviewed  COMPREHENSIVE METABOLIC PANEL - Abnormal; Notable for the following components:      Result Value   Glucose, Bld 108 (*)    Alkaline Phosphatase 133 (*)    All other components within normal limits  CBC - Abnormal; Notable for the following components:   RBC 5.55 (*)    MCH 24.5 (*)    All other components within normal limits  NOVEL CORONAVIRUS, NAA (HOSP ORDER, SEND-OUT TO REF LAB; TAT 18-24 HRS)  LIPASE, BLOOD  URINALYSIS, ROUTINE W REFLEX MICROSCOPIC  I-STAT BETA HCG BLOOD, ED ([facility], [facility], [facility] ONLY)    EKG None  Radiology No results found.  Procedures Procedures (including critical care time)  Medications Ordered in ED Medications  sodium chloride flush (NS) 0.9 % injection 3 mL (has no administration in time range)  dicyclomine (BENTYL) capsule 10 mg (10 mg Oral Given 10/01/19 1307)  ondansetron (ZOFRAN) tablet 4 mg (4 mg Oral Given 10/01/19 1307)  ketorolac (TORADOL) 15 MG/ML injection 15 mg (15 mg Intramuscular Given 10/01/19 1307)    ED Course  I have reviewed the triage vital signs and the nursing notes.  Pertinent labs & imaging results that were available during my care of the patient were reviewed by me and considered in my medical decision making (see chart for details).    MDM Rules/Calculators/A&P                      Patient presenting for evaluation of viral-like symptoms such as headache, nausea, vomiting, diarrhea, abdominal cramping.  Physical examination, she appears  nontoxic.  She has a diffusely tender abdomen, however reports pain only while having a bowel movement.  As such, likely abdominal cramping for intestinal irritation.  Doubt appendicitis, but this remains on the differential.  Patient without neurologic deficits, in the setting of headache with abdominal symptoms, doubt acute intracranial emergency.  Labs obtained from triage are reassuring, no leukocytosis.  Electrolytes stable.  Will treat symptomatically and reassess.  On reassessment, patient reports pain is improved.  No further nausea.  Discussed option of CT to rule out appendicitis, patient does not want CT at this time.  Discussed close monitoring of symptoms, and if pain worsens or moves to the right lower quadrant, she should return for further evaluation.  I do not believe she needs emergent head CT at this time.  Discussed continued symptomatic treatment at home.  Will perform Covid test for further evaluation.  At this time, patient appears safe for discharge.  Return precautions given.  Patient states he understands and agrees to plan.  Mary Mitchell was evaluated in Emergency Department on 10/01/2019 for the symptoms described in the history of present illness. She was evaluated in the context of the global COVID-19 pandemic, which necessitated consideration that the patient might be at risk for infection with the SARS-CoV-2 virus that causes COVID-19. Institutional protocols and algorithms that pertain to the evaluation of patients at risk for COVID-19 are in a state of rapid change based on information released by regulatory bodies including the CDC and federal and state organizations. These policies and algorithms were followed during the patient's care in the ED.  Final Clinical Impression(s) / ED Diagnoses Final diagnoses:  Viral illness  Acute nonintractable headache, unspecified headache type  Nausea vomiting and diarrhea  Abdominal cramping    Rx / DC  Orders ED Discharge  Orders         Ordered    ondansetron (ZOFRAN) 4 MG tablet  Every 8 hours PRN     10/01/19 1356    dicyclomine (BENTYL) 20 MG tablet  2 times daily PRN     10/01/19 1356    naproxen (NAPROSYN) 500 MG tablet  2 times daily with meals     10/01/19 1356           Woodie Trusty, PA-C 10/01/19 1436    Terrilee Files, MD 10/01/19 1736

## 2019-10-01 NOTE — ED Triage Notes (Signed)
Pt states she has had a sinus headache x 2 days. Pt states that she has had nausea as well. Pt states she has had back pain and "severe" diarrhea/

## 2019-10-02 LAB — NOVEL CORONAVIRUS, NAA (HOSP ORDER, SEND-OUT TO REF LAB; TAT 18-24 HRS): SARS-CoV-2, NAA: NOT DETECTED

## 2021-05-02 ENCOUNTER — Encounter (HOSPITAL_COMMUNITY): Payer: Self-pay

## 2021-05-02 ENCOUNTER — Inpatient Hospital Stay (HOSPITAL_COMMUNITY): Payer: Self-pay

## 2021-05-02 ENCOUNTER — Inpatient Hospital Stay (HOSPITAL_COMMUNITY)
Admission: AD | Admit: 2021-05-02 | Discharge: 2021-05-02 | Disposition: A | Discharge status: Home / Self Care | Payer: Self-pay | Attending: Family Medicine | Admitting: Family Medicine

## 2021-05-02 DIAGNOSIS — O021 Missed abortion: Secondary | ICD-10-CM | POA: Insufficient documentation

## 2021-05-02 DIAGNOSIS — O26891 Other specified pregnancy related conditions, first trimester: Secondary | ICD-10-CM | POA: Insufficient documentation

## 2021-05-02 DIAGNOSIS — M549 Dorsalgia, unspecified: Secondary | ICD-10-CM | POA: Insufficient documentation

## 2021-05-02 DIAGNOSIS — F17210 Nicotine dependence, cigarettes, uncomplicated: Secondary | ICD-10-CM | POA: Insufficient documentation

## 2021-05-02 DIAGNOSIS — O0941 Supervision of pregnancy with grand multiparity, first trimester: Secondary | ICD-10-CM | POA: Insufficient documentation

## 2021-05-02 DIAGNOSIS — Z3A01 Less than 8 weeks gestation of pregnancy: Secondary | ICD-10-CM | POA: Insufficient documentation

## 2021-05-02 DIAGNOSIS — O99331 Smoking (tobacco) complicating pregnancy, first trimester: Secondary | ICD-10-CM | POA: Insufficient documentation

## 2021-05-02 DIAGNOSIS — Z6791 Unspecified blood type, Rh negative: Secondary | ICD-10-CM | POA: Insufficient documentation

## 2021-05-02 DIAGNOSIS — O209 Hemorrhage in early pregnancy, unspecified: Secondary | ICD-10-CM

## 2021-05-02 LAB — WET PREP, GENITAL
Clue Cells Wet Prep HPF POC: NONE SEEN
Sperm: NONE SEEN
Trich, Wet Prep: NONE SEEN
Yeast Wet Prep HPF POC: NONE SEEN

## 2021-05-02 LAB — URINALYSIS, ROUTINE W REFLEX MICROSCOPIC
Bacteria, UA: NONE SEEN
Bilirubin Urine: NEGATIVE
Glucose, UA: NEGATIVE mg/dL
Ketones, ur: NEGATIVE mg/dL
Leukocytes,Ua: NEGATIVE
Nitrite: NEGATIVE
Protein, ur: NEGATIVE mg/dL
Specific Gravity, Urine: 1.02 (ref 1.005–1.030)
pH: 6 (ref 5.0–8.0)

## 2021-05-02 LAB — CBC
HCT: 37.9 % (ref 36.0–46.0)
Hemoglobin: 12 g/dL (ref 12.0–15.0)
MCH: 25.1 pg — ABNORMAL LOW (ref 26.0–34.0)
MCHC: 31.7 g/dL (ref 30.0–36.0)
MCV: 79.1 fL — ABNORMAL LOW (ref 80.0–100.0)
Platelets: 317 10*3/uL (ref 150–400)
RBC: 4.79 MIL/uL (ref 3.87–5.11)
RDW: 13.9 % (ref 11.5–15.5)
WBC: 8.1 10*3/uL (ref 4.0–10.5)
nRBC: 0 % (ref 0.0–0.2)

## 2021-05-02 LAB — HCG, QUANTITATIVE, PREGNANCY: hCG, Beta Chain, Quant, S: 2640 m[IU]/mL — ABNORMAL HIGH (ref <5)

## 2021-05-02 LAB — POCT PREGNANCY, URINE: Preg Test, Ur: POSITIVE — AB

## 2021-05-02 MED ORDER — RHO D IMMUNE GLOBULIN 1500 UNIT/2ML IJ SOSY
300.0000 ug | PREFILLED_SYRINGE | Freq: Once | INTRAMUSCULAR | Status: AC
  Administered 2021-05-02: 300 ug via INTRAMUSCULAR
  Filled 2021-05-02: qty 2

## 2021-05-02 MED ORDER — OXYCODONE-ACETAMINOPHEN 5-325 MG PO TABS
2.0000 | ORAL_TABLET | Freq: Once | ORAL | Status: AC
  Administered 2021-05-02: 2 via ORAL

## 2021-05-02 MED ORDER — OXYCODONE HCL 5 MG PO TABS: 5.0000 mg | ORAL_TABLET | ORAL | 0 refills | Status: AC | PRN

## 2021-05-02 MED ORDER — MISOPROSTOL 200 MCG PO TABS: 800.0000 ug | ORAL_TABLET | ORAL | 1 refills | Status: DC

## 2021-05-02 NOTE — MAU Provider Note (Signed)
History     CSN: [id number]  Arrival date and time: 05/02/21 1884   Event Date/Time   First Provider Initiated Contact with Patient 05/02/21 0930      Chief Complaint  Patient presents with   Vaginal Bleeding   Back Pain   Abdominal Pain   HPI Mary Mitchell is a 25 y.o. [id number] at Unknown who presents with abdominal pain & vaginal bleeding. Has irregular cycles but reports an ultrasound at pregnancy care center showed that she was 7-[redacted] weeks pregnant last week.  Current symptoms started last night around 920 pm. Initially had heavy bright red bleeding like a period. Passed some small clots. Did not saturate pads. States now the bleeding is pink spotting. Endorses suprapubic pain that is cramp like & intermittent. Rates pain 8/10. Nothing makes better or worse. Hasn't treated symptoms. Denies fever, vomiting, diarrhea, vaginal discharge, or dysuria.   OB History     Gravida  8   Para  7   Term  7   Preterm  0   AB  0   Living  7      SAB  0   IAB  0   Ectopic  0   Multiple  0   Live Births  7           Past Medical History:  Diagnosis Date   ADHD (attention deficit hyperactivity disorder)    Asthma    Bipolar 1 disorder (HCC)    PTSD (post-traumatic stress disorder)     Past Surgical History:  Procedure Laterality Date   ABDOMINAL HERNIA REPAIR     HERNIA REPAIR      Family History  Problem Relation Age of Onset   Diabetes Mother    Hypertension Mother    Diabetes Maternal Grandmother    Hypertension Maternal Grandmother    Heart disease Maternal Grandmother    Cystic fibrosis Daughter     Social History   Tobacco Use   Smoking status: Light Smoker    Packs/day: 0.25    Types: Cigarettes   Smokeless tobacco: Current  Substance Use Topics   Alcohol use: No   Drug use: No    Allergies: No Known Allergies  No medications prior to admission.    Review of Systems  Constitutional: Negative.   Gastrointestinal:  Positive  for abdominal pain.  Genitourinary:  Positive for vaginal bleeding.  Physical Exam   Blood pressure (!) 141/78, pulse 91, resp. rate 16, height 5\' 2"  (1.575 m), weight 111.5 kg, SpO2 99 %, unknown if currently breastfeeding.  Physical Exam Vitals and nursing note reviewed. Exam conducted with a chaperone present.  Constitutional:      General: She is not in acute distress.    Appearance: She is well-developed.  HENT:     Head: Normocephalic and atraumatic.  Eyes:     General: No scleral icterus. Pulmonary:     Effort: Pulmonary effort is normal. No respiratory distress.  Abdominal:     General: There is no distension.     Palpations: Abdomen is soft.     Tenderness: There is abdominal tenderness in the suprapubic area. There is no guarding or rebound.  Genitourinary:    Comments: Pelvic: scant amount of dark red mucoid blood. No active bleeding. Cervix pink/smooth.  Bimanual: no CMT, no adnexal masses (difficult exam d/t body habitus) Neurological:     Mental Status: She is alert.    MAU Course  Procedures Results for orders placed or performed  during the hospital encounter of 05/02/21 (from the past 24 hour(s))  Pregnancy, urine POC     Status: Abnormal   Collection Time: 05/02/21  9:13 AM  Result Value Ref Range   Preg Test, Ur POSITIVE (A) NEGATIVE  Urinalysis, Routine w reflex microscopic Urine, Clean Catch     Status: Abnormal   Collection Time: 05/02/21  9:32 AM  Result Value Ref Range   Color, Urine YELLOW YELLOW   APPearance HAZY (A) CLEAR   Specific Gravity, Urine 1.020 1.005 - 1.030   pH 6.0 5.0 - 8.0   Glucose, UA NEGATIVE NEGATIVE mg/dL   Hgb urine dipstick SMALL (A) NEGATIVE   Bilirubin Urine NEGATIVE NEGATIVE   Ketones, ur NEGATIVE NEGATIVE mg/dL   Protein, ur NEGATIVE NEGATIVE mg/dL   Nitrite NEGATIVE NEGATIVE   Leukocytes,Ua NEGATIVE NEGATIVE   RBC / HPF 6-10 0 - 5 RBC/hpf   WBC, UA 0-5 0 - 5 WBC/hpf   Bacteria, UA NONE SEEN NONE SEEN   Squamous  Epithelial / LPF 0-5 0 - 5   Mucus PRESENT    Amorphous Crystal PRESENT   Wet prep, genital     Status: Abnormal   Collection Time: 05/02/21  9:36 AM   Specimen: Cervix  Result Value Ref Range   Yeast Wet Prep HPF POC NONE SEEN NONE SEEN   Trich, Wet Prep NONE SEEN NONE SEEN   Clue Cells Wet Prep HPF POC NONE SEEN NONE SEEN   WBC, Wet Prep HPF POC MANY (A) NONE SEEN   Sperm NONE SEEN   CBC     Status: Abnormal   Collection Time: 05/02/21  9:57 AM  Result Value Ref Range   WBC 8.1 4.0 - 10.5 K/uL   RBC 4.79 3.87 - 5.11 MIL/uL   Hemoglobin 12.0 12.0 - 15.0 g/dL   HCT 94.8 54.6 - 27.0 %   MCV 79.1 (L) 80.0 - 100.0 fL   MCH 25.1 (L) 26.0 - 34.0 pg   MCHC 31.7 30.0 - 36.0 g/dL   RDW 35.0 09.3 - 81.8 %   Platelets 317 150 - 400 K/uL   nRBC 0.0 0.0 - 0.2 %  hCG, quantitative, pregnancy     Status: Abnormal   Collection Time: 05/02/21  9:57 AM  Result Value Ref Range   hCG, Beta Chain, Quant, S 2,640 (H) <5 mIU/mL  Rh IG workup (includes ABO/Rh)     Status: None (Preliminary result)   Collection Time: 05/02/21  9:57 AM  Result Value Ref Range   Gestational Age(Wks) 8    ABO/RH(D) A NEG    Antibody Screen NEG    Unit Number [id number]    Blood Component Type RHIG    Unit division 00    Status of Unit ISSUED    Transfusion Status      OK TO TRANSFUSE Performed at [facility], [address], [place], [place] [address]     US OB LESS THAN 14 WEEKS WITH [place] TRANSVAGINAL  Result Date: 05/02/2021 CLINICAL DATA:  25 year old female with vaginal bleeding in the 1st trimester of pregnancy. EXAM: OBSTETRIC <14 WK [place] AND TRANSVAGINAL OB US TECHNIQUE: Both transabdominal and transvaginal ultrasound examinations were performed for complete evaluation of the gestation as well as the maternal uterus, adnexal regions, and pelvic cul-de-sac. Transvaginal technique was performed to assess early pregnancy. COMPARISON:  None relevant. FINDINGS: Intrauterine gestational sac: Single Yolk  sac:  Visible Embryo:  Visible Cardiac Activity: Not detected.  See series 3.  Heart Rate: 0 bpm CRL: 11 mm   7 w   1 d Subchorionic hemorrhage: Small volume of subchorionic hemorrhage (images 45, 58). Maternal uterus/adnexae: No pelvic free fluid. Left ovary probably contains the corpus luteum and appears normal measuring 4.4 x 2.4 by 3.4 cm (image 19). Right ovary appears normal measuring 2.9 x 1.5 x 2.2 cm. IMPRESSION: IUP, with fetal pole measuring 11 mm but absent fetal cardiac activity. This meets definitive criteria for failed pregnancy (SRU consensus guidelines: Diagnostic Criteria for Nonviable Pregnancy Early in the First Trimester. Malva Limes Med [phone]). Electronically Signed   By: Odessa Fleming M.D.   On: 05/02/2021 10:57    MDM +UPT UA, wet prep, GC/chlamydia, CBC, ABO/Rh, quant hCG, and [place] today to rule out ectopic pregnancy which can be life threatening.   Wet prep negative  RH negative - received rhogam today  Ultrasound shows IUP measuring [redacted]w[redacted]d with no cardiac activity - meets criteria for failed pregnancy  Discussed options for management of incomplete AB including expectant management, Cytotec or D&C. Prefers cytotec at this time. Verbalizes understanding that intervention may become necessary if SAB in not completed spontaneously or if heavy bleeding or infection occur.      Early Intrauterine Pregnancy Failure Protocol X  Documented intrauterine pregnancy failure less than or equal to [redacted] weeks   gestation  X  No serious current illness  X  Baseline Hgb greater than or equal to 10g/dl  X  Patient has easily accessible transportation to the hospital  X  Clear preference  X  Practitioner/physician deems patient reliable  X  Counseling by practitioner or physician   X   Rho-Gam given by RN if indicated  X  Medication dispensed  X  Cytotec 800 mcg Intravaginally by patient at home - provided with 1 refill      Intravaginally by NP in MAU       Rectally by patient at home        Rectally by RN in MAU  X Oxycodone 5 mg #18 prescribed  Reviewed with pt cytotec procedure.  Pt verbalizes that she lives close to the hospital and has transportation readily available.  Pt appears reliable and verbalizes understanding and agrees with plan of care  Assessment and Plan   1. Missed abortion with fetal demise before 20 completed weeks of gestation   2. Vaginal bleeding in pregnancy, first trimester   3. Rh negative state in antepartum period, first trimester   4. [redacted] weeks gestation of pregnancy    -Rx cytotec & oxycodone -Use ibuprofen for pain prn -GC/CT pending -reviewed bleeding/infection precautions & reasons to return to MAU -msg to office for SAB f/u  Judeth Horn 05/02/2021, 7:00 PM

## 2021-05-02 NOTE — Discharge Instructions (Signed)
Cytotec for Pregnancy Failure FACTS YOU SHOULD KNOW  WHAT IS AN EARLY PREGNANCY FAILURE? Once the egg is fertilized with the sperm and begins to develop, it attaches to the lining of the uterus. This early pregnancy tissue may not develop into an embryo (the beginning stage of a baby). Sometimes an embryo does develop but does not continue to grow. These problems can be seen on ultrasound.   MANAGEMNT OF EARLY PREGNANCY FAILURE: About 4 out of 100 (0.25%) women will have a pregnancy loss in her lifetime.  One in five pregnancies is found to be an early pregnancy failure.  There are 3 ways to care for an early pregnancy failure:   Surgery, (2) Medicine, (3) Waiting for you to pass the pregnancy on your own. The decision as to how to proceed after being diagnosed with and early pregnancy failure is an individual one.  The decision can be made only after appropriate counseling.  You need to weigh the pros and cons of the 3 choices. Then you can make the choice that works for you. SURGERY (D&E) Procedure over in 1 day Requires being put to sleep Bleeding may be light Possible problems during surgery, including injury to womb(uterus) Care provider has more control Medicine (CYTOTEC) The complete procedure may take days to weeks No Surgery Bleeding may be heavy at times There may be drug side effects Patient has more control Waiting You may choose to wait, in which case your own body may complete the passing of the abnormal early pregnancy on its own in about 2-4 weeks Your bleeding may be heavy at times There is a small possibility that you may need surgery if the bleeding is too much or not all of the pregnancy has passed. CYTOTEC MANAGEMENT Prostaglandins (cytotec) are the most widely used drug for this purpose. They cause the uterus to cramp and contract. You will place the medicine yourself inside your vagina in the privacy of your home. Empting of the uterus should occur within 3 days but  the process may continue for several weeks. The bleeding may seem heavy at times. POSSIBLE SIDE EFFECTS FROM CYTOTEC Nausea   Vomiting Diarrhea Fever Chills  Hot Flashes Side effects  from the process of the early pregnancy failure include: Cramping  Bleeding Headaches  Dizziness RISKS: This is a low risk procedure. Less than 1 in 100 women has a complication. An incomplete passage of the early pregnancy may occur. Also, Hemorrhage (heavy bleeding) could happen.  Rarely the pregnancy will not be passed completely. Excessively heavy bleeding may occur.  Your doctor may need to perform surgery to empty the uterus (D&E). Afterwards: Everybody will feel differently after the early pregnancy completion. You may have soreness or cramps for a day or two. You may have soreness or cramps for day or two.  You may have light bleeding for up to 2 weeks. You may be as active as you feel like being. If you have any of the following problems you may call Maternity Admissions Unit at [phone]. If you have pain that does not get better  with pain medication Bleeding that soaks through 2 thick full-sized sanitary pads in an hour Cramps that last longer than 2 days Foul smelling discharge Fever above 100.4 degrees F Even if you do not have any of these symptoms, you should have a follow-up exam to make sure you are healing properly. This appointment will be made for you before you leave the hospital. Your next normal period will  start again in 4-6 week after the loss. You can get pregnant soon after the loss, so use birth control right away. Finally: Make sure all your questions are answered before during and after any procedure. Follow up with medical care and family planning methods.     

## 2021-05-02 NOTE — MAU Note (Signed)
Patient arrived to MAU complaining of heavy bleeding with clots, back pain and lower abdominal pain that started at 2120 last night. Patient stated that she did not take any medication for the pain. She stated that [facility] They did an ultrasound and told her she was about 7-[redacted] wks pregnant. She is unsure about her last period due to her cycle being irregular.

## 2021-05-03 LAB — GC/CHLAMYDIA PROBE AMP (~~LOC~~) NOT AT ARMC
Chlamydia: NEGATIVE
Comment: NEGATIVE
Comment: NORMAL
Neisseria Gonorrhea: NEGATIVE

## 2021-05-03 LAB — RH IG WORKUP (INCLUDES ABO/RH)
ABO/RH(D): A NEG
Antibody Screen: NEGATIVE
Gestational Age(Wks): 8
Unit division: 0

## 2021-05-25 ENCOUNTER — Ambulatory Visit: Payer: Self-pay | Admitting: Family Medicine

## 2022-04-14 ENCOUNTER — Inpatient Hospital Stay (HOSPITAL_COMMUNITY)
Admission: AD | Admit: 2022-04-14 | Discharge: 2022-04-14 | Disposition: A | Discharge status: Home / Self Care | Payer: Self-pay | Attending: Obstetrics and Gynecology | Admitting: Obstetrics and Gynecology

## 2022-04-14 ENCOUNTER — Encounter (HOSPITAL_COMMUNITY): Payer: Self-pay

## 2022-04-14 DIAGNOSIS — N939 Abnormal uterine and vaginal bleeding, unspecified: Secondary | ICD-10-CM

## 2022-04-14 DIAGNOSIS — R1110 Vomiting, unspecified: Secondary | ICD-10-CM | POA: Insufficient documentation

## 2022-04-14 DIAGNOSIS — R109 Unspecified abdominal pain: Secondary | ICD-10-CM | POA: Insufficient documentation

## 2022-04-14 DIAGNOSIS — O209 Hemorrhage in early pregnancy, unspecified: Secondary | ICD-10-CM | POA: Insufficient documentation

## 2022-04-14 DIAGNOSIS — R531 Weakness: Secondary | ICD-10-CM | POA: Insufficient documentation

## 2022-04-14 DIAGNOSIS — O26899 Other specified pregnancy related conditions, unspecified trimester: Secondary | ICD-10-CM | POA: Insufficient documentation

## 2022-04-14 DIAGNOSIS — R42 Dizziness and giddiness: Secondary | ICD-10-CM | POA: Insufficient documentation

## 2022-04-14 DIAGNOSIS — O039 Complete or unspecified spontaneous abortion without complication: Secondary | ICD-10-CM | POA: Insufficient documentation

## 2022-04-14 DIAGNOSIS — Z6791 Unspecified blood type, Rh negative: Secondary | ICD-10-CM | POA: Insufficient documentation

## 2022-04-14 LAB — CBC
HCT: 39.1 % (ref 36.0–46.0)
Hemoglobin: 12.6 g/dL (ref 12.0–15.0)
MCH: 25 pg — ABNORMAL LOW (ref 26.0–34.0)
MCHC: 32.2 g/dL (ref 30.0–36.0)
MCV: 77.7 fL — ABNORMAL LOW (ref 80.0–100.0)
Platelets: 319 10*3/uL (ref 150–400)
RBC: 5.03 MIL/uL (ref 3.87–5.11)
RDW: 13.5 % (ref 11.5–15.5)
WBC: 7.3 10*3/uL (ref 4.0–10.5)
nRBC: 0 % (ref 0.0–0.2)

## 2022-04-14 LAB — COMPREHENSIVE METABOLIC PANEL
ALT: 12 U/L (ref 0–44)
AST: 17 U/L (ref 15–41)
Albumin: 3.5 g/dL (ref 3.5–5.0)
Alkaline Phosphatase: 80 U/L (ref 38–126)
Anion gap: 7 (ref 5–15)
BUN: <5 mg/dL — ABNORMAL LOW (ref 6–20)
CO2: 21 mmol/L — ABNORMAL LOW (ref 22–32)
Calcium: 9.3 mg/dL (ref 8.9–10.3)
Chloride: 107 mmol/L (ref 98–111)
Creatinine, Ser: 0.62 mg/dL (ref 0.44–1.00)
GFR, Estimated: >60 mL/min (ref >60)
Glucose, Bld: 81 mg/dL (ref 70–99)
Potassium: 3.7 mmol/L (ref 3.5–5.1)
Sodium: 135 mmol/L (ref 135–145)
Total Bilirubin: 0.4 mg/dL (ref 0.3–1.2)
Total Protein: 7.1 g/dL (ref 6.5–8.1)

## 2022-04-14 LAB — TYPE AND SCREEN
ABO/RH(D): A NEG
Antibody Screen: NEGATIVE

## 2022-04-14 MED ORDER — HYDROMORPHONE HCL 1 MG/ML IJ SOLN
1.0000 mg | Freq: Once | INTRAMUSCULAR | Status: AC
  Administered 2022-04-14: 1 mg via INTRAVENOUS
  Filled 2022-04-14 (×2): qty 1

## 2022-04-14 MED ORDER — HYDROMORPHONE HCL 1 MG/ML IJ SOLN
1.0000 mg | Freq: Once | INTRAMUSCULAR | Status: AC
  Administered 2022-04-14: 1 mg via INTRAVENOUS
  Filled 2022-04-14: qty 1

## 2022-04-14 MED ORDER — IBUPROFEN 800 MG PO TABS: 800.0000 mg | ORAL_TABLET | Freq: Three times a day (TID) | ORAL | 0 refills | Status: AC | PRN

## 2022-04-14 MED ORDER — HYDROMORPHONE HCL 1 MG/ML IJ SOLN
1.0000 mg | Freq: Once | INTRAMUSCULAR | Status: AC
  Administered 2022-04-14: 1 mg via INTRAVENOUS

## 2022-04-14 MED ORDER — LACTATED RINGERS IV BOLUS
1000.0000 mL | Freq: Once | INTRAVENOUS | Status: AC
  Administered 2022-04-14: 1000 mL via INTRAVENOUS

## 2022-04-14 MED ORDER — ACETAMINOPHEN 325 MG PO TABS: 650.0000 mg | ORAL_TABLET | Freq: Four times a day (QID) | ORAL | 0 refills | Status: AC | PRN

## 2022-04-14 MED ORDER — OXYCODONE HCL 5 MG PO TABS: 5.0000 mg | ORAL_TABLET | Freq: Four times a day (QID) | ORAL | 0 refills | Status: DC | PRN

## 2022-04-14 MED ORDER — KETOROLAC TROMETHAMINE 30 MG/ML IJ SOLN
30.0000 mg | Freq: Once | INTRAMUSCULAR | Status: AC
  Administered 2022-04-14: 30 mg via INTRAVENOUS
  Filled 2022-04-14: qty 1

## 2022-04-14 MED ORDER — MISOPROSTOL 200 MCG PO TABS
800.0000 ug | ORAL_TABLET | Freq: Once | ORAL | Status: AC
  Administered 2022-04-14: 800 ug via BUCCAL
  Filled 2022-04-14: qty 4

## 2022-04-14 MED ORDER — IBUPROFEN 800 MG PO TABS: 800.0000 mg | ORAL_TABLET | Freq: Three times a day (TID) | ORAL | 0 refills | Status: DC | PRN

## 2022-04-14 MED ORDER — RHO D IMMUNE GLOBULIN 1500 UNIT/2ML IJ SOSY
300.0000 ug | PREFILLED_SYRINGE | Freq: Once | INTRAMUSCULAR | Status: AC
  Administered 2022-04-14: 300 ug via INTRAVENOUS
  Filled 2022-04-14: qty 2

## 2022-04-14 NOTE — MAU Note (Addendum)
...  Mary Mitchell is a 26 y.o. at Unknown here in MAU reporting: This morning she started having yellow discharge. Around 1600 she started leaking fluids started bleeding while at work. She reports around 1700ish she passed a baby. She reports she knew she was pregnant but was unsure of how far along. She reports she believes her LMP was sometime in April. She reports shortly after her bleeding increased and she passed.   Dr. Crissie Reese, MD, at bedside immediately.   Pain score: 10/10 lower abdomen

## 2022-04-14 NOTE — Discharge Instructions (Addendum)
You came to the MAU because you had cramping and delivered a preterm baby. Your placenta did not come out right away so we gave you a medication to help with the placenta to come out. We watched you for a few hours and did blood work which showed your blood count was ok.  You will get a call for an ultrasound next Monday or Tuesday and will have a visit in the office the same day to discuss the results. If you don't hear from anyone to schedule that ultrasound please call [phone] to schedule the ultrasound.   We sent you pain medication to your pharmacy. You can expect to have some heavy bleeding over the next few days to week.   Please come back sooner if your bleeding gets heavy enough that you are soaking through multiple pads in an hours time more than 2-3 hours in a row.

## 2022-04-14 NOTE — MAU Provider Note (Addendum)
History     [id number]  Arrival date and time: 04/14/22 1744    Chief Complaint  Patient presents with   Vaginal Bleeding   Abdominal Pain     HPI Mary Mitchell is a 26 y.o. at Unknown GA with PMHx notable for 7 prior NSVD, Rh negative status, who presents for delivery of pre-viable fetus.   Patient reports she was at work just prior to presentation when she felt a pop She went to the bathroom and saw clear fluid leaking Subsequently had increasing pain  Went to change her pants and pain kept getting worse, went back to the bathroom and suddenly felt intense pressure and pain and suddenly delivered a small fetus She subsequently began to have heavy bleeding and came to MAU for evaluation She reports feeling weak and very thirsty currently   --/--/A NEG (07/27 1816)  OB History     Gravida  9   Para  7   Term  7   Preterm  0   AB  0   Living  7      SAB  0   IAB  0   Ectopic  0   Multiple  0   Live Births  7           Past Medical History:  Diagnosis Date   ADHD (attention deficit hyperactivity disorder)    Asthma    Bipolar 1 disorder (HCC)    PTSD (post-traumatic stress disorder)     Past Surgical History:  Procedure Laterality Date   ABDOMINAL HERNIA REPAIR     HERNIA REPAIR      Family History  Problem Relation Age of Onset   Diabetes Mother    Hypertension Mother    Diabetes Maternal Grandmother    Hypertension Maternal Grandmother    Heart disease Maternal Grandmother    Cystic fibrosis Daughter     Social History   Socioeconomic History   Marital status: Married    Spouse name: Not on file   Number of children: Not on file   Years of education: Not on file   Highest education level: Not on file  Occupational History   Not on file  Tobacco Use   Smoking status: Light Smoker    Packs/day: 0.25    Types: Cigarettes   Smokeless tobacco: Current  Substance and Sexual Activity   Alcohol use: No   Drug use: No    Sexual activity: Not Currently  Other Topics Concern   Not on file  Social History Narrative   Not on file   Social Determinants of Health   Financial Resource Strain: Not on file  Food Insecurity: Not on file  Transportation Needs: Not on file  Physical Activity: Not on file  Stress: Not on file  Social Connections: Not on file  Intimate Partner Violence: Not on file    No Known Allergies  No current facility-administered medications on file prior to encounter.   Current Outpatient Medications on File Prior to Encounter  Medication Sig Dispense Refill   acetaminophen (TYLENOL) 325 MG tablet Take 325 mg by mouth every 6 (six) hours as needed for mild pain.     albuterol (PROVENTIL HFA;VENTOLIN HFA) 108 (90 Base) MCG/ACT inhaler Inhale 1-2 puffs into the lungs every 6 (six) hours as needed for wheezing or shortness of breath. 1 Inhaler 0   dicyclomine (BENTYL) 20 MG tablet Take 1 tablet (20 mg total) by mouth 2 (two) times daily as needed for  spasms. 20 tablet 0   misoprostol (CYTOTEC) 200 MCG tablet Take 4 tablets (800 mcg total) by mouth every 24 hours x 2 doses for 2 doses. Take second dose in 24 hours if you don't have bleeding 4 tablet 1     ROS Pertinent positives and negative per HPI, all others reviewed and negative  Physical Exam   BP 101/75   Pulse 96   Temp 98.4 F (36.9 C) (Oral)   LMP  (LMP Unknown)   SpO2 100%   Patient Vitals for the past 24 hrs:  BP Temp Temp src Pulse SpO2  04/14/22 1921 101/75 -- -- 96 --  04/14/22 1911 111/74 -- -- 91 --  04/14/22 1901 104/71 -- -- 78 --  04/14/22 1841 132/61 -- -- 91 --  04/14/22 1833 (!) 119/48 -- -- 90 --  04/14/22 1805 128/69 -- -- 91 100 %  04/14/22 1803 117/60 98.4 F (36.9 C) Oral 95 100 %    Physical Exam Vitals reviewed.  Constitutional:      Appearance: She is well-developed. She is not toxic-appearing or diaphoretic.     Comments: uncomfortable  Eyes:     General: No scleral  icterus. Cardiovascular:     Rate and Rhythm: Normal rate and regular rhythm.     Heart sounds: No murmur heard. Pulmonary:     Effort: Pulmonary effort is normal. No respiratory distress.  Abdominal:     Tenderness: There is abdominal tenderness.  Genitourinary:    Vagina: Bleeding present.     Cervix: Dilated.     Comments: Large amount of bright red blood in the vault. After clearing out placental tissue seen protruding from the os Skin:    General: Skin is warm and dry.  Neurological:     General: No focal deficit present.     Mental Status: She is alert.      Cervical Exam    Bedside Ultrasound Not done  My interpretation: n/a  FHT N/a  Labs Results for orders placed or performed during the hospital encounter of 04/14/22 (from the past 24 hour(s))  CBC     Status: Abnormal   Collection Time: 04/14/22  6:01 PM  Result Value Ref Range   WBC 7.3 4.0 - 10.5 K/uL   RBC 5.03 3.87 - 5.11 MIL/uL   Hemoglobin 12.6 12.0 - 15.0 g/dL   HCT 27.5 17.0 - 01.7 %   MCV 77.7 (L) 80.0 - 100.0 fL   MCH 25.0 (L) 26.0 - 34.0 pg   MCHC 32.2 30.0 - 36.0 g/dL   RDW 49.4 49.6 - 75.9 %   Platelets 319 150 - 400 K/uL   nRBC 0.0 0.0 - 0.2 %  Rh IG workup (includes ABO/Rh)     Status: None (Preliminary result)   Collection Time: 04/14/22  6:01 PM  Result Value Ref Range   Gestational Age(Wks) 12    Unit Number [id number]    Blood Component Type RHIG    Unit division 00    Status of Unit ISSUED    Transfusion Status      OK TO TRANSFUSE Performed at [facility], [address] [place], [place] [address]   Comprehensive metabolic panel     Status: Abnormal   Collection Time: 04/14/22  6:01 PM  Result Value Ref Range   Sodium 135 135 - 145 mmol/L   Potassium 3.7 3.5 - 5.1 mmol/L   Chloride 107 98 - 111 mmol/L   CO2 21 (L)  22 - 32 mmol/L   Glucose, Bld 81 70 - 99 mg/dL   BUN <5 (L) 6 - 20 mg/dL   Creatinine, Ser 7.12 0.44 - 1.00 mg/dL   Calcium 9.3 8.9 - 45.8 mg/dL    Total Protein 7.1 6.5 - 8.1 g/dL   Albumin 3.5 3.5 - 5.0 g/dL   AST 17 15 - 41 U/L   ALT 12 0 - 44 U/L   Alkaline Phosphatase 80 38 - 126 U/L   Total Bilirubin 0.4 0.3 - 1.2 mg/dL   GFR, Estimated >09 >98 mL/min   Anion gap 7 5 - 15  Type and screen [facility]     Status: None   Collection Time: 04/14/22  6:16 PM  Result Value Ref Range   ABO/RH(D) A NEG    Antibody Screen NEG    Sample Expiration      04/17/2022,2359 Performed at [facility], [address] [place], [place] [address]     Imaging No results found.  MAU Course  Procedures Lab Orders         CBC         Comprehensive metabolic panel     Meds ordered this encounter  Medications   rho (d) immune globulin (RHIG/RHOPHYLAC) injection 300 mcg   lactated ringers bolus 1,000 mL   HYDROmorphone (DILAUDID) injection 1 mg   misoprostol (CYTOTEC) tablet 800 mcg   HYDROmorphone (DILAUDID) injection 1 mg   ketorolac (TORADOL) 30 MG/ML injection 30 mg   Imaging Orders  No imaging studies ordered today    MDM High   Assessment and Plan  #PPROM #Pre-viable delivery #Estimated [redacted] weeks gestation Patient s/p PPROM and previable delivery. On arrival was having heavy vaginal bleeding with large clots. Initially attempted speculum exam but patient was in too much pain, IVF started, labs obtained, and given IV pain meds. After IV dilaudid patient much more comfortable and able to complete speculum exam, placental tissue and membranes was seen at the os and was removed piecemeal with ring forceps. Some trailing membranes were removed but I suspect there are still some POC's that need to exit. Bleeding improved significantly with removal of obstructing POC's though. At this point will try to give her some misoprostol and observe for about an hour to make sure bleeding is appropriate. If doing OK can d/c home with pain meds and coordinate outpatient follow up. Reassuringly CBC does not show any  significant drop yet.   Care signed out to Dr. Ephriam Jenkins at change of shift.    Took over patient's care from Dr. Roney Mans  8:20PM Assessed patient. Still feeling some cramping pain. Requesting more pain medications. Ordered Dilaudid and toradol through IV. Awaiting rhogam  9:54 PM  Assessed patient at bedside. Feels a little tired and some lightheadedness. BP reading 90s/60s but then on recheck 110s-120s. Speculum exam done and ~200cc blood/clots removed (measured in emesis bag) and also removed portions of placenta. Blood cleared out and cervix visualized. No active brisk bleeding through cervix. Only small amount of blood at os. No other tissue noted.  Received rhogam   Discussed with Dr. Jolayne Panther. Patient is hemodynamically stable at this time. Will discharge.  Follow up plan: - [place] ordered at discharge for outpatient follow up US in 3-4 days - Sent message to [facility] to schedule provider visit on same day - if still has products on imaging next week needs to have D&C scheduled   #Rh negative status Received rhogam  Warner Mccreedy, MD, MPH OB Fellow, Faculty Practice

## 2022-04-15 LAB — RH IG WORKUP (INCLUDES ABO/RH)
Gestational Age(Wks): 12
Unit division: 0

## 2022-04-28 ENCOUNTER — Ambulatory Visit: Admission: RE | Admit: 2022-04-28 | Payer: MEDICAID | Source: Ambulatory Visit

## 2022-05-04 ENCOUNTER — Ambulatory Visit: Payer: Self-pay | Admitting: Obstetrics and Gynecology

## 2022-06-07 ENCOUNTER — Ambulatory Visit (INDEPENDENT_AMBULATORY_CARE_PROVIDER_SITE_OTHER): Payer: Self-pay

## 2022-06-07 ENCOUNTER — Ambulatory Visit (HOSPITAL_COMMUNITY)
Admission: EM | Admit: 2022-06-07 | Discharge: 2022-06-07 | Disposition: A | Discharge status: Home / Self Care | Payer: Self-pay | Attending: Family Medicine | Admitting: Family Medicine

## 2022-06-07 ENCOUNTER — Encounter (HOSPITAL_COMMUNITY): Payer: Self-pay | Admitting: Emergency Medicine

## 2022-06-07 ENCOUNTER — Other Ambulatory Visit: Payer: Self-pay

## 2022-06-07 DIAGNOSIS — R109 Unspecified abdominal pain: Secondary | ICD-10-CM | POA: Insufficient documentation

## 2022-06-07 DIAGNOSIS — Z3202 Encounter for pregnancy test, result negative: Secondary | ICD-10-CM

## 2022-06-07 DIAGNOSIS — R1011 Right upper quadrant pain: Secondary | ICD-10-CM

## 2022-06-07 LAB — CBC WITH DIFFERENTIAL/PLATELET
Abs Immature Granulocytes: 0.03 10*3/uL (ref 0.00–0.07)
Basophils Absolute: 0 10*3/uL (ref 0.0–0.1)
Basophils Relative: 0 %
Eosinophils Absolute: 0.1 10*3/uL (ref 0.0–0.5)
Eosinophils Relative: 1 %
HCT: 42.1 % (ref 36.0–46.0)
Hemoglobin: 13.2 g/dL (ref 12.0–15.0)
Immature Granulocytes: 0 %
Lymphocytes Relative: 37 %
Lymphs Abs: 2.5 10*3/uL (ref 0.7–4.0)
MCH: 24.4 pg — ABNORMAL LOW (ref 26.0–34.0)
MCHC: 31.4 g/dL (ref 30.0–36.0)
MCV: 78 fL — ABNORMAL LOW (ref 80.0–100.0)
Monocytes Absolute: 0.4 10*3/uL (ref 0.1–1.0)
Monocytes Relative: 5 %
Neutro Abs: 3.9 10*3/uL (ref 1.7–7.7)
Neutrophils Relative %: 57 %
Platelets: 373 10*3/uL (ref 150–400)
RBC: 5.4 MIL/uL — ABNORMAL HIGH (ref 3.87–5.11)
RDW: 13.5 % (ref 11.5–15.5)
WBC: 6.9 10*3/uL (ref 4.0–10.5)
nRBC: 0 % (ref 0.0–0.2)

## 2022-06-07 LAB — POCT URINALYSIS DIPSTICK, ED / UC
Bilirubin Urine: NEGATIVE
Glucose, UA: NEGATIVE mg/dL
Hgb urine dipstick: NEGATIVE
Ketones, ur: NEGATIVE mg/dL
Nitrite: NEGATIVE
Protein, ur: NEGATIVE mg/dL
Specific Gravity, Urine: 1.025 (ref 1.005–1.030)
Urobilinogen, UA: 1 mg/dL (ref 0.0–1.0)
pH: 6 (ref 5.0–8.0)

## 2022-06-07 LAB — SEDIMENTATION RATE: Sed Rate: 10 mm/hr (ref 0–22)

## 2022-06-07 LAB — COMPREHENSIVE METABOLIC PANEL
ALT: 22 U/L (ref 0–44)
AST: 23 U/L (ref 15–41)
Albumin: 3.7 g/dL (ref 3.5–5.0)
Alkaline Phosphatase: 107 U/L (ref 38–126)
Anion gap: 9 (ref 5–15)
BUN: <5 mg/dL — ABNORMAL LOW (ref 6–20)
CO2: 26 mmol/L (ref 22–32)
Calcium: 9.4 mg/dL (ref 8.9–10.3)
Chloride: 104 mmol/L (ref 98–111)
Creatinine, Ser: 0.65 mg/dL (ref 0.44–1.00)
GFR, Estimated: >60 mL/min (ref >60)
Glucose, Bld: 84 mg/dL (ref 70–99)
Potassium: 4.1 mmol/L (ref 3.5–5.1)
Sodium: 139 mmol/L (ref 135–145)
Total Bilirubin: 0.7 mg/dL (ref 0.3–1.2)
Total Protein: 7.7 g/dL (ref 6.5–8.1)

## 2022-06-07 LAB — POC URINE PREG, ED: Preg Test, Ur: NEGATIVE

## 2022-06-07 NOTE — ED Provider Notes (Signed)
[facility]    CSN: [id number] Arrival date & time: 06/07/22  1007      History   Chief Complaint Chief Complaint  Patient presents with   Abdominal Pain    HPI Mary Mitchell is a 26 y.o. female.   Patient is here for right sided back/flank pain.  It is consistently in the right back, but can also be across the low back.  Pain with movement, coughing, laughing, etc.  Pain started 2 days ago, but today woke in "excrutiating" pain.  When she breaths she feels it "all over" in her back and belly.  No n/v.  No urinary symptoms.  No diarrhea or constipation.   No vaginal symptoms.  She had a miscarriage in July.  She had an u/s scheduled in august, but no showed this appointment.  This was to make sure all the placenta came out.  She states this pain started after having intercourse several days ago.  No fevers/chills.  Pain is 10/10.  No otc meds taken for pain.  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  Not much walking, but also not much at a desk.    Past Medical History:  Diagnosis Date   ADHD (attention deficit hyperactivity disorder)    Asthma    Bipolar 1 disorder (HCC)    PTSD (post-traumatic stress disorder)     Patient Active Problem List   Diagnosis Date Noted   Indication for care in labor and delivery, antepartum 10/07/2017   Vaginal delivery 10/07/2017   Vaginal pain 09/04/2017   Candida vaginitis 09/04/2017   Normal labor 09/25/2016   Grand multiparity 08/18/2016   Short interval between pregnancies affecting pregnancy in third trimester, antepartum 08/18/2016   History of shoulder dystocia in prior pregnancy, currently pregnant in third trimester 08/18/2016   History of marijuana use 03/21/2016   No prenatal care in current pregnancy in third trimester    Rh negative state in antepartum period 01/31/2014   Mild intermittent asthma 01/30/2014   Tobacco abuse 01/30/2014    Past Surgical History:  Procedure Laterality Date    ABDOMINAL HERNIA REPAIR     HERNIA REPAIR      OB History     Gravida  9   Para  7   Term  7   Preterm  0   AB  0   Living  7      SAB  0   IAB  0   Ectopic  0   Multiple  0   Live Births  7            Home Medications    Prior to Admission medications   Medication Sig Start Date End Date Taking? Authorizing Provider  albuterol (PROVENTIL HFA;VENTOLIN HFA) 108 (90 Base) MCG/ACT inhaler Inhale 1-2 puffs into the lungs every 6 (six) hours as needed for wheezing or shortness of breath. Patient not taking: Reported on 06/07/2022 07/01/16   Judeth HornLawrence, Gearldean Lomanto, NP  oxyCODONE (ROXICODONE) 5 MG immediate release tablet Take 1 tablet (5 mg total) by mouth every 6 (six) hours as needed for severe pain. Patient not taking: Reported on 06/07/2022 04/14/22   Warner Mccreedyas, Anuka, MD    Family History Family History  Problem Relation Age of Onset   Diabetes Mother    Hypertension Mother    Diabetes Maternal Grandmother    Hypertension Maternal Grandmother    Heart disease Maternal Grandmother    Cystic fibrosis Daughter     Social History  Social History   Tobacco Use   Smoking status: Light Smoker    Packs/day: 0.25    Types: Cigarettes   Smokeless tobacco: Current  Vaping Use   Vaping Use: Never used  Substance Use Topics   Alcohol use: No   Drug use: No     Allergies   Patient has no known allergies.   Review of Systems Review of Systems  Constitutional:  Negative for chills and fever.  HENT: Negative.    Respiratory: Negative.    Cardiovascular: Negative.   Gastrointestinal:  Positive for abdominal pain. Negative for constipation, diarrhea, nausea and vomiting.  Genitourinary:  Positive for flank pain.  Skin: Negative.   Psychiatric/Behavioral: Negative.       Physical Exam Triage Vital Signs ED Triage Vitals  Enc Vitals Group     BP 06/07/22 1124 135/82     Pulse Rate 06/07/22 1124 80     Resp 06/07/22 1124 20     Temp 06/07/22 1124 98.4 F (36.9  C)     Temp Source 06/07/22 1124 Oral     SpO2 06/07/22 1124 98 %     Weight --      Height --      Head Circumference --      Peak Flow --      Pain Score 06/07/22 1121 10     Pain Loc --      Pain Edu? --      Excl. in [place]? --    No data found.  Updated Vital Signs BP 135/82 (BP Location: Right Arm) Comment (BP Location): large cuff  Pulse 80   Temp 98.4 F (36.9 C) (Oral)   Resp 20   LMP 06/03/2022 Comment: reports period 2 days ago, lasted 2 days  SpO2 98%   Breastfeeding Unknown   Visual Acuity Right Eye Distance:   Left Eye Distance:   Bilateral Distance:    Right Eye Near:   Left Eye Near:    Bilateral Near:     Physical Exam Constitutional:      Appearance: She is well-developed.  HENT:     Head: Normocephalic.  Cardiovascular:     Rate and Rhythm: Normal rate and regular rhythm.  Pulmonary:     Effort: Pulmonary effort is normal.     Breath sounds: Normal breath sounds.  Abdominal:     General: Bowel sounds are normal.     Tenderness: There is abdominal tenderness in the right upper quadrant and epigastric area. There is right CVA tenderness.     Comments: Very TTP, and pain with movement;  no pain without movement  Skin:    General: Skin is warm.  Neurological:     Mental Status: She is alert.      UC Treatments / Results  Labs (all labs ordered are listed, but only abnormal results are displayed) Labs Reviewed  POCT URINALYSIS DIPSTICK, ED / UC - Abnormal; Notable for the following components:      Result Value   Leukocytes,Ua SMALL (*)    All other components within normal limits  CBC WITH DIFFERENTIAL/PLATELET  COMPREHENSIVE METABOLIC PANEL  SEDIMENTATION RATE  POC URINE PREG, ED    EKG   Radiology DG Abd 1 View  Result Date: 06/07/2022 CLINICAL DATA:  Pain right upper quadrant, back pain EXAM: ABDOMEN - 1 VIEW COMPARISON:  None Available. FINDINGS: Bowel gas pattern is nonspecific. Small amount of stool is seen in colon. No  abnormal masses or calcifications are  seen. Kidneys are partly obscured by bowel contents. Visualized bony structures are unremarkable. IMPRESSION: Nonspecific bowel gas pattern. Electronically Signed   By: Elmer Picker M.D.   On: 06/07/2022 12:11    Procedures Procedures (including critical care time)  Medications Ordered in UC Medications - No data to display  Initial Impression / Assessment and Plan / UC Course  I have reviewed the triage vital signs and the nursing notes.  Pertinent labs & imaging results that were available during my care of the patient were reviewed by me and considered in my medical decision making (see chart for details).  Patient was seen today for right sided abdominal pain and flank pain.  Urine negative for infection or stone, preg test negative.  Xray normal.  Given her level of pain, I did recommend she go to the ER for further imaging.  However, she elected to check lab work to start, and if that is abnormal, or if her pain worsens or does not improve, then go to the ER for evaluation.    Final Clinical Impressions(s) / UC Diagnoses   Final diagnoses:  Abdominal pain, unspecified abdominal location     Discharge Instructions      You were seen today for back and abdominal pain.  Your urine was normal, pregnancy test negative, and your xray was normal.  I have ordered lab work for you today for further investigation and this should be resulted by later today or tomorrow.  As discussed, given you level of pain I do recommend you go to the ER if your lab work is abnormal, or if your pain is not resolving as I do not have a good answer for your pain today.  I do recommend you follow up with your ob/gyn for follow up of your miscarriage as well.     ED Prescriptions   None    PDMP not reviewed this encounter.   Rondel Oh, MD 06/07/22 1230

## 2022-06-07 NOTE — Discharge Instructions (Addendum)
You were seen today for back and abdominal pain.  Your urine was normal, pregnancy test negative, and your xray was normal.  I have ordered lab work for you today for further investigation and this should be resulted by later today or tomorrow.  As discussed, given you level of pain I do recommend you go to the ER if your lab work is abnormal, or if your pain is not resolving as I do not have a good answer for your pain today.  I do recommend you follow up with your ob/gyn for follow up of your miscarriage as well.

## 2022-06-07 NOTE — ED Triage Notes (Signed)
Reports abdominal pain and back pain for 2 days.  Pain is aggravated by coughing and deep breathing.  No chest pain.  Greatest pain is right side of abdomen/flank.  Denies urinary symptoms.  One month ago had a miscarriage.  Had intercourse after this.  And then had sharp pains.  Denies nausea, vomiting or diarrhea.  Reports last bm was 2-3 days ago and reported as hard, small amount of stool

## 2023-03-03 ENCOUNTER — Encounter (HOSPITAL_COMMUNITY): Payer: Self-pay | Admitting: *Deleted

## 2023-03-03 ENCOUNTER — Emergency Department (HOSPITAL_COMMUNITY)
Admission: EM | Admit: 2023-03-03 | Discharge: 2023-03-03 | Disposition: A | Discharge status: Home / Self Care | Payer: 59 | Attending: Emergency Medicine | Admitting: Emergency Medicine

## 2023-03-03 ENCOUNTER — Other Ambulatory Visit: Payer: Self-pay

## 2023-03-03 DIAGNOSIS — Z202 Contact with and (suspected) exposure to infections with a predominantly sexual mode of transmission: Secondary | ICD-10-CM

## 2023-03-03 LAB — URINALYSIS, ROUTINE W REFLEX MICROSCOPIC
Bilirubin Urine: NEGATIVE
Glucose, UA: NEGATIVE mg/dL
Hgb urine dipstick: NEGATIVE
Ketones, ur: NEGATIVE mg/dL
Nitrite: NEGATIVE
Protein, ur: NEGATIVE mg/dL
Specific Gravity, Urine: 1.023 (ref 1.005–1.030)
pH: 5 (ref 5.0–8.0)

## 2023-03-03 MED ORDER — DOXYCYCLINE HYCLATE 100 MG PO CAPS: 100.0000 mg | ORAL_CAPSULE | Freq: Two times a day (BID) | ORAL | 0 refills | Status: AC

## 2023-03-03 MED ORDER — LIDOCAINE HCL (PF) 1 % IJ SOLN
2.1000 mL | Freq: Once | INTRAMUSCULAR | Status: AC
  Administered 2023-03-03: 2.1 mL
  Filled 2023-03-03: qty 30

## 2023-03-03 MED ORDER — CEFTRIAXONE SODIUM 1 G IJ SOLR
500.0000 mg | Freq: Once | INTRAMUSCULAR | Status: AC
  Administered 2023-03-03: 500 mg via INTRAMUSCULAR
  Filled 2023-03-03: qty 10

## 2023-03-03 NOTE — ED Provider Notes (Signed)
[place] EMERGENCY DEPARTMENT AT [facility] Provider Note   CSN: [id number] Arrival date & time: 03/03/23  1213     History  Chief Complaint  Patient presents with   Exposure to STD    Mary Mitchell is a 27 y.o. female.  Patient complains of an exposure to chlamydia.  Patient is here with her partner who states he was treated here yesterday for chlamydia.  He reports he had a positive test.  Patient does not want STD testing patient does not want to have a pelvic exam she is requesting treatment.  Denies any discharge she denies any symptoms  The history is provided by the patient. No language interpreter was used.  Exposure to STD This is a new problem. The problem occurs constantly. The problem has not changed since onset.Nothing aggravates the symptoms. Nothing relieves the symptoms. She has tried nothing for the symptoms. The treatment provided moderate relief.       Home Medications Prior to Admission medications   Medication Sig Start Date End Date Taking? Authorizing Provider  doxycycline (VIBRAMYCIN) 100 MG capsule Take 1 capsule (100 mg total) by mouth 2 (two) times daily. 03/03/23  Yes Elson Areas, PA-C      Allergies    Patient has no known allergies.    Review of Systems   Review of Systems  All other systems reviewed and are negative.   Physical Exam Updated Vital Signs BP (!) 138/97 (BP Location: Left Arm)   Pulse (!) 111   Temp 99.2 F (37.3 C) (Oral)   Resp 15   Ht 5\' 2"  (1.575 m)   LMP  (LMP Unknown)   SpO2 96%   BMI 44.98 kg/m  Physical Exam Vitals reviewed.  Constitutional:      Appearance: Normal appearance.  Cardiovascular:     Rate and Rhythm: Normal rate.  Pulmonary:     Effort: Pulmonary effort is normal.  Skin:    General: Skin is warm.  Neurological:     General: No focal deficit present.     Mental Status: She is alert.  Psychiatric:        Mood and Affect: Mood normal.     ED Results /  Procedures / Treatments   Labs (all labs ordered are listed, but only abnormal results are displayed) Labs Reviewed  URINALYSIS, ROUTINE W REFLEX MICROSCOPIC - Abnormal; Notable for the following components:      Result Value   Leukocytes,Ua SMALL (*)    Bacteria, UA RARE (*)    All other components within normal limits  WET PREP, GENITAL  I-STAT BETA HCG BLOOD, ED ([facility], [facility], [facility] ONLY)  GC/CHLAMYDIA PROBE AMP ([place]) NOT AT [facility]    EKG None  Radiology No results found.  Procedures Procedures    Medications Ordered in ED Medications  cefTRIAXone (ROCEPHIN) injection 500 mg (has no administration in time range)    ED Course/ Medical Decision Making/ A&P                             Medical Decision Making Patient is requesting treatment for chlamydia  Amount and/or Complexity of Data Reviewed Independent Historian:     Details: Patient is here with her partner who reports he tested positive and has been treated Labs: ordered. Decision-making details documented in ED Course.    Details: GC and Chlamydia swab ordered  Risk Prescription drug management. Risk Details: Rocephin and  a prescription for doxycycline.           Final Clinical Impression(s) / ED Diagnoses Final diagnoses:  STD exposure    Rx / DC Orders ED Discharge Orders          Ordered    doxycycline (VIBRAMYCIN) 100 MG capsule  2 times daily        03/03/23 1307           An After Visit Summary was printed and given to the patient.    Osie Cheeks 03/03/23 1318    Lorre Nick, MD 03/05/23 [phone]

## 2023-03-03 NOTE — ED Notes (Signed)
Pt refused self swab

## 2023-03-03 NOTE — ED Triage Notes (Signed)
Pt has exposure to std, partner has been treated.

## 2023-03-03 NOTE — ED Notes (Signed)
Pt denying Labs at this time.

## 2023-03-06 LAB — GC/CHLAMYDIA PROBE AMP (~~LOC~~) NOT AT ARMC
Chlamydia: POSITIVE — AB
Comment: NEGATIVE
Comment: NORMAL
Neisseria Gonorrhea: NEGATIVE

## 2023-11-26 ENCOUNTER — Emergency Department (HOSPITAL_COMMUNITY)
Admission: EM | Admit: 2023-11-26 | Discharge: 2023-11-26 | Disposition: A | Discharge status: Home / Self Care | Payer: Self-pay | Attending: Emergency Medicine | Admitting: Emergency Medicine

## 2023-11-26 ENCOUNTER — Encounter (HOSPITAL_COMMUNITY): Payer: Self-pay

## 2023-11-26 ENCOUNTER — Other Ambulatory Visit: Payer: Self-pay

## 2023-11-26 ENCOUNTER — Emergency Department (HOSPITAL_COMMUNITY): Payer: Self-pay

## 2023-11-26 DIAGNOSIS — R1032 Left lower quadrant pain: Secondary | ICD-10-CM | POA: Insufficient documentation

## 2023-11-26 LAB — URINALYSIS, ROUTINE W REFLEX MICROSCOPIC
Bilirubin Urine: NEGATIVE
Glucose, UA: NEGATIVE mg/dL
Hgb urine dipstick: NEGATIVE
Ketones, ur: NEGATIVE mg/dL
Nitrite: NEGATIVE
Protein, ur: NEGATIVE mg/dL
Specific Gravity, Urine: 1.021 (ref 1.005–1.030)
pH: 6 (ref 5.0–8.0)

## 2023-11-26 LAB — CBC
HCT: 42.6 % (ref 36.0–46.0)
Hemoglobin: 13.2 g/dL (ref 12.0–15.0)
MCH: 25 pg — ABNORMAL LOW (ref 26.0–34.0)
MCHC: 31 g/dL (ref 30.0–36.0)
MCV: 80.5 fL (ref 80.0–100.0)
Platelets: 301 10*3/uL (ref 150–400)
RBC: 5.29 MIL/uL — ABNORMAL HIGH (ref 3.87–5.11)
RDW: 14.8 % (ref 11.5–15.5)
WBC: 8.6 10*3/uL (ref 4.0–10.5)
nRBC: 0 % (ref 0.0–0.2)

## 2023-11-26 LAB — COMPREHENSIVE METABOLIC PANEL
ALT: 19 U/L (ref 0–44)
AST: 21 U/L (ref 15–41)
Albumin: 3.3 g/dL — ABNORMAL LOW (ref 3.5–5.0)
Alkaline Phosphatase: 87 U/L (ref 38–126)
Anion gap: 6 (ref 5–15)
BUN: 9 mg/dL (ref 6–20)
CO2: 25 mmol/L (ref 22–32)
Calcium: 8.7 mg/dL — ABNORMAL LOW (ref 8.9–10.3)
Chloride: 106 mmol/L (ref 98–111)
Creatinine, Ser: 0.7 mg/dL (ref 0.44–1.00)
GFR, Estimated: >60 mL/min (ref >60)
Glucose, Bld: 150 mg/dL — ABNORMAL HIGH (ref 70–99)
Potassium: 3.8 mmol/L (ref 3.5–5.1)
Sodium: 137 mmol/L (ref 135–145)
Total Bilirubin: 0.5 mg/dL (ref 0.0–1.2)
Total Protein: 6.9 g/dL (ref 6.5–8.1)

## 2023-11-26 LAB — HCG, SERUM, QUALITATIVE: Preg, Serum: NEGATIVE

## 2023-11-26 LAB — LIPASE, BLOOD: Lipase: 26 U/L (ref 11–51)

## 2023-11-26 NOTE — Discharge Instructions (Signed)
 It was a pleasure taking care of you here in the emergency department.  We discussed getting a CT scan which you declined.  I make sure to keep a close eye on your symptoms, return for any worsening symptoms

## 2023-11-26 NOTE — ED Provider Triage Note (Signed)
 Emergency Medicine Provider Triage Evaluation Note  Mary Mitchell , a 28 y.o. female  was evaluated in triage.  Pt complains of LLQ pain.  Patient reports intermittent left lower quadrant pain for the past 3 days.  Has been worsening in severity since onset.  States it appears that she is having labor pains.  Denies any associated nausea, vomiting, diarrhea, fevers, vaginal bleeding, or discharge.  Review of Systems  Positive: Left lower quadrant pain Negative: Vaginal bleeding, discharge, dysuria, nausea, vomiting, diarrhea  Physical Exam  BP (!) 135/94 (BP Location: Right Arm)   Pulse 92   Temp 98.5 F (36.9 C)   Resp 20   Ht 5\' 2"  (1.575 m)   Wt 107 kg   LMP  (LMP Unknown)   SpO2 100%   BMI 43.16 kg/m  Gen:   Awake, no distress   Resp:  Normal effort  MSK:   Moves extremities without difficulty  Other:    Medical Decision Making  Medically screening exam initiated at 2:41 PM.  Appropriate orders placed.  Mary Mitchell was informed that the remainder of the evaluation will be completed by another provider, this initial triage assessment does not replace that evaluation, and the importance of remaining in the ED until their evaluation is complete.   Mary Marion, PA-C 11/26/23 1442

## 2023-11-26 NOTE — ED Provider Notes (Signed)
 [place] EMERGENCY DEPARTMENT AT [facility] Provider Note   CSN: [id number] Arrival date & time: 11/26/23  1417     History  Chief Complaint  Patient presents with   Abdominal Pain    Mary Mitchell is a 28 y.o. female here for evaluation of left lower quadrant pain.  Started a few days ago.  No dysuria or hematuria.  No pelvic pain, vaginal discharge.  No concern for any STIs.  No flank pain.  No nausea, vomiting, diarrhea.  Passing flatus.  Taking some OTC meds without relief.  Describes the pain as cramping like contractions.  Unknown last LMP.  HPI     Home Medications Prior to Admission medications   Medication Sig Start Date End Date Taking? Authorizing Provider  doxycycline (VIBRAMYCIN) 100 MG capsule Take 1 capsule (100 mg total) by mouth 2 (two) times daily. 03/03/23   Elson Areas, PA-C      Allergies    Patient has no known allergies.    Review of Systems   Review of Systems  Constitutional: Negative.   HENT: Negative.    Respiratory: Negative.    Cardiovascular: Negative.   Gastrointestinal:  Positive for abdominal pain. Negative for abdominal distention, anal bleeding, blood in stool, constipation, diarrhea, nausea, rectal pain and vomiting.  Genitourinary: Negative.   Musculoskeletal: Negative.   Skin: Negative.   Neurological: Negative.   All other systems reviewed and are negative.   Physical Exam Updated Vital Signs BP 134/82 (BP Location: Right Arm)   Pulse 84   Temp 98.3 F (36.8 C)   Resp 18   Ht 5\' 2"  (1.575 m)   Wt 107 kg   LMP  (LMP Unknown)   SpO2 98%   BMI 43.16 kg/m  Physical Exam Vitals and nursing note reviewed.  Constitutional:      General: She is not in acute distress.    Appearance: She is well-developed. She is not ill-appearing, toxic-appearing or diaphoretic.  HENT:     Head: Atraumatic.  Eyes:     Pupils: Pupils are equal, round, and reactive to light.  Cardiovascular:     Rate and Rhythm:  Normal rate.     Heart sounds: Normal heart sounds.  Pulmonary:     Effort: Pulmonary effort is normal. No respiratory distress.     Breath sounds: Normal breath sounds.  Abdominal:     General: Bowel sounds are normal. There is no distension.     Palpations: Abdomen is soft.     Tenderness: There is abdominal tenderness in the left lower quadrant. There is no right CVA tenderness, left CVA tenderness, guarding or rebound.     Hernia: No hernia is present.  Musculoskeletal:        General: Normal range of motion.     Cervical back: Normal range of motion.  Skin:    General: Skin is warm and dry.  Neurological:     General: No focal deficit present.     Mental Status: She is alert.  Psychiatric:        Mood and Affect: Mood normal.     ED Results / Procedures / Treatments   Labs (all labs ordered are listed, but only abnormal results are displayed) Labs Reviewed  COMPREHENSIVE METABOLIC PANEL - Abnormal; Notable for the following components:      Result Value   Glucose, Bld 150 (*)    Calcium 8.7 (*)    Albumin 3.3 (*)    All other  components within normal limits  CBC - Abnormal; Notable for the following components:   RBC 5.29 (*)    MCH 25.0 (*)    All other components within normal limits  URINALYSIS, ROUTINE W REFLEX MICROSCOPIC - Abnormal; Notable for the following components:   APPearance HAZY (*)    Leukocytes,Ua SMALL (*)    Bacteria, UA FEW (*)    All other components within normal limits  LIPASE, BLOOD  HCG, SERUM, QUALITATIVE    EKG None  Radiology US PELVIC TRANSABD W/PELVIC DOPPLER Result Date: 11/26/2023 CLINICAL DATA:  Left lower quadrant pain EXAM: TRANSABDOMINAL ULTRASOUND OF PELVIS DOPPLER ULTRASOUND OF OVARIES TECHNIQUE: Transabdominal ultrasound examination of the pelvis was performed including evaluation of the uterus, ovaries, adnexal regions, and pelvic cul-de-sac. Color and duplex Doppler ultrasound was utilized to evaluate blood flow to the  ovaries. COMPARISON:  Ultrasound Ob 05/02/2021 and older FINDINGS: Uterus Measurements: 9.0 x 4.7 x 5.5 cm = volume: 123.5 mL. No fibroids or other mass visualized. Endometrium Thickness: 7 mm.  No focal abnormality visualized. Right ovary Measurements: 2.1 x 3.1 x 2.1 cm = volume: 7.1 mL. Multiple follicles are seen. Left ovary Measurements: 2.8 x 2.9 x 2.1 cm = volume: 8.7 mL. Multiple follicles are seen. Pulsed Doppler evaluation demonstrates normal low-resistance arterial and venous waveforms in both ovaries. Other: No significant free fluid in the pelvis. IMPRESSION: Unremarkable transabdominal pelvic ultrasound. Electronically Signed   By: Karen Kays M.D.   On: 11/26/2023 16:00    Procedures Procedures    Medications Ordered in ED Medications - No data to display  ED Course/ Medical Decision Making/ A&P   28 year old here for evaluation of left lower quadrant pain.  Started few days ago.  Constant however waxes and wanes in intensity.  Described as contractions.  No dysuria, hematuria.  No flank pain, history of kidney stones.  No change in bowel movements, bloody stool.  Workup started from triage which I personally viewed and interpreted  Labs and imaging personally viewed and interpreted:  CBC without leukocytosis Metabolic panel without significant abnormality Pregnancy test negative Lipase 26 UA negative for infection Ultrasound negative for torsion  Patient reassessed.  We discussed her labs and imaging.  I have recommended a CT scan for further evaluation.  Patient declined.  Unfortunately she has been here in the emergency department for 7 hours with extended wait out in the waiting room.  She states she is wants to go home.  Will have her keep a close eye on her symptoms, return for any worsening symptoms.  Patient is nontoxic, nonseptic appearing, in no apparent distress.  Patient's pain and other symptoms adequately managed in emergency department.  Fluid bolus given.   Labs, imaging and vitals reviewed.  Patient does not meet the SIRS or Sepsis criteria.  On repeat exam patient does not have a surgical abdomin and there are no peritoneal signs.  No indication of appendicitis, bowel obstruction, bowel perforation, cholecystitis, diverticulitis, PID, intermittent/persistent torsion, TOA or ectopic pregnancy.  Patient discharged home with symptomatic treatment and given strict instructions for follow-up with their primary care physician.  I have also discussed reasons to return immediately to the ER.  Patient expresses understanding and agrees with plan.                                  Medical Decision Making Amount and/or Complexity of Data Reviewed External Data Reviewed: labs, radiology  and notes. Labs: ordered. Decision-making details documented in ED Course. Radiology: ordered and independent interpretation performed. Decision-making details documented in ED Course.  Risk OTC drugs. Decision regarding hospitalization. Diagnosis or treatment significantly limited by social determinants of health.         Final Clinical Impression(s) / ED Diagnoses Final diagnoses:  LLQ pain    Rx / DC Orders ED Discharge Orders     None         Laurrie Toppin A, PA-C 11/26/23 2131    Tegeler, Canary Brim, MD 11/26/23 2321

## 2023-11-26 NOTE — ED Triage Notes (Addendum)
 Pt c/o 2 day history of bilateral low abd pain without N/V/D or fevers. The pain is described as an intermittent cramping pain, like "contractions." She did not tack her menstrual cycle and does not know LMP, but does report she has not had it in a couple of months. Denies vaginal irritation or discharge. She did report having a blood clot last month after intercourse, but never started her period. She denies using any toys or foreign objects that would have caused trauma. She is not on birth control and did not use barrier protection.

## 2024-04-18 ENCOUNTER — Other Ambulatory Visit: Payer: Self-pay

## 2024-04-18 ENCOUNTER — Emergency Department (HOSPITAL_COMMUNITY)
Admission: EM | Admit: 2024-04-18 | Discharge: 2024-04-18 | Discharge status: Against Medical Advice | Payer: Self-pay | Attending: Emergency Medicine | Admitting: Emergency Medicine

## 2024-04-18 ENCOUNTER — Encounter (HOSPITAL_COMMUNITY): Payer: Self-pay | Admitting: Emergency Medicine

## 2024-04-18 DIAGNOSIS — Z5321 Procedure and treatment not carried out due to patient leaving prior to being seen by health care provider: Secondary | ICD-10-CM | POA: Insufficient documentation

## 2024-04-18 DIAGNOSIS — R1084 Generalized abdominal pain: Secondary | ICD-10-CM | POA: Insufficient documentation

## 2024-04-18 DIAGNOSIS — R197 Diarrhea, unspecified: Secondary | ICD-10-CM | POA: Insufficient documentation

## 2024-04-18 LAB — URINALYSIS, ROUTINE W REFLEX MICROSCOPIC
Bilirubin Urine: NEGATIVE
Glucose, UA: NEGATIVE mg/dL
Hgb urine dipstick: NEGATIVE
Ketones, ur: NEGATIVE mg/dL
Nitrite: NEGATIVE
Protein, ur: NEGATIVE mg/dL
Specific Gravity, Urine: 1.023 (ref 1.005–1.030)
pH: 6 (ref 5.0–8.0)

## 2024-04-18 LAB — COMPREHENSIVE METABOLIC PANEL WITH GFR
ALT: 20 U/L (ref 0–44)
AST: 18 U/L (ref 15–41)
Albumin: 3.2 g/dL — ABNORMAL LOW (ref 3.5–5.0)
Alkaline Phosphatase: 72 U/L (ref 38–126)
Anion gap: 7 (ref 5–15)
BUN: 9 mg/dL (ref 6–20)
CO2: 23 mmol/L (ref 22–32)
Calcium: 8.5 mg/dL — ABNORMAL LOW (ref 8.9–10.3)
Chloride: 106 mmol/L (ref 98–111)
Creatinine, Ser: 0.62 mg/dL (ref 0.44–1.00)
GFR, Estimated: >60 mL/min (ref >60)
Glucose, Bld: 122 mg/dL — ABNORMAL HIGH (ref 70–99)
Potassium: 3.7 mmol/L (ref 3.5–5.1)
Sodium: 136 mmol/L (ref 135–145)
Total Bilirubin: 0.5 mg/dL (ref 0.0–1.2)
Total Protein: 6.6 g/dL (ref 6.5–8.1)

## 2024-04-18 LAB — CBC
HCT: 44.6 % (ref 36.0–46.0)
Hemoglobin: 14 g/dL (ref 12.0–15.0)
MCH: 25.3 pg — ABNORMAL LOW (ref 26.0–34.0)
MCHC: 31.4 g/dL (ref 30.0–36.0)
MCV: 80.5 fL (ref 80.0–100.0)
Platelets: 320 K/uL (ref 150–400)
RBC: 5.54 MIL/uL — ABNORMAL HIGH (ref 3.87–5.11)
RDW: 14.4 % (ref 11.5–15.5)
WBC: 9.7 K/uL (ref 4.0–10.5)
nRBC: 0 % (ref 0.0–0.2)

## 2024-04-18 LAB — HCG, SERUM, QUALITATIVE: Preg, Serum: NEGATIVE

## 2024-04-18 LAB — LIPASE, BLOOD: Lipase: 33 U/L (ref 11–51)

## 2024-04-18 NOTE — ED Triage Notes (Signed)
 Pt in with generalized abdominal pain since waking last night. Pt states the pain is burning, and she's had 6 loose stools PTA.

## 2024-04-18 NOTE — ED Notes (Signed)
 Patient stated that getting a room was taking to long and that she had to be to work at 7 so she decided to leave.

## 2024-05-08 ENCOUNTER — Other Ambulatory Visit: Payer: Self-pay

## 2024-05-08 ENCOUNTER — Emergency Department (HOSPITAL_COMMUNITY): Payer: Self-pay

## 2024-05-08 ENCOUNTER — Emergency Department (HOSPITAL_COMMUNITY)
Admission: EM | Admit: 2024-05-08 | Discharge: 2024-05-08 | Disposition: A | Discharge status: Home / Self Care | Payer: Self-pay | Attending: Emergency Medicine | Admitting: Emergency Medicine

## 2024-05-08 DIAGNOSIS — R110 Nausea: Secondary | ICD-10-CM | POA: Insufficient documentation

## 2024-05-08 DIAGNOSIS — R079 Chest pain, unspecified: Secondary | ICD-10-CM

## 2024-05-08 DIAGNOSIS — R0602 Shortness of breath: Secondary | ICD-10-CM | POA: Insufficient documentation

## 2024-05-08 DIAGNOSIS — J45909 Unspecified asthma, uncomplicated: Secondary | ICD-10-CM | POA: Insufficient documentation

## 2024-05-08 DIAGNOSIS — R5381 Other malaise: Secondary | ICD-10-CM | POA: Insufficient documentation

## 2024-05-08 DIAGNOSIS — R42 Dizziness and giddiness: Secondary | ICD-10-CM | POA: Insufficient documentation

## 2024-05-08 DIAGNOSIS — R0789 Other chest pain: Secondary | ICD-10-CM | POA: Insufficient documentation

## 2024-05-08 DIAGNOSIS — Z87891 Personal history of nicotine dependence: Secondary | ICD-10-CM | POA: Insufficient documentation

## 2024-05-08 DIAGNOSIS — R55 Syncope and collapse: Secondary | ICD-10-CM | POA: Insufficient documentation

## 2024-05-08 LAB — URINALYSIS, ROUTINE W REFLEX MICROSCOPIC
Bilirubin Urine: NEGATIVE
Glucose, UA: NEGATIVE mg/dL
Hgb urine dipstick: NEGATIVE
Ketones, ur: NEGATIVE mg/dL
Nitrite: NEGATIVE
Protein, ur: NEGATIVE mg/dL
Specific Gravity, Urine: 1.015 (ref 1.005–1.030)
pH: 6 (ref 5.0–8.0)

## 2024-05-08 LAB — CBC
HCT: 43 % (ref 36.0–46.0)
Hemoglobin: 12.8 g/dL (ref 12.0–15.0)
MCH: 24.7 pg — ABNORMAL LOW (ref 26.0–34.0)
MCHC: 29.8 g/dL — ABNORMAL LOW (ref 30.0–36.0)
MCV: 82.9 fL (ref 80.0–100.0)
Platelets: 290 K/uL (ref 150–400)
RBC: 5.19 MIL/uL — ABNORMAL HIGH (ref 3.87–5.11)
RDW: 14.1 % (ref 11.5–15.5)
WBC: 6.6 K/uL (ref 4.0–10.5)
nRBC: 0 % (ref 0.0–0.2)

## 2024-05-08 LAB — COMPREHENSIVE METABOLIC PANEL WITH GFR
ALT: 27 U/L (ref 0–44)
AST: 26 U/L (ref 15–41)
Albumin: 3.1 g/dL — ABNORMAL LOW (ref 3.5–5.0)
Alkaline Phosphatase: 74 U/L (ref 38–126)
Anion gap: 9 (ref 5–15)
BUN: 11 mg/dL (ref 6–20)
CO2: 23 mmol/L (ref 22–32)
Calcium: 8.3 mg/dL — ABNORMAL LOW (ref 8.9–10.3)
Chloride: 102 mmol/L (ref 98–111)
Creatinine, Ser: 0.87 mg/dL (ref 0.44–1.00)
GFR, Estimated: >60 mL/min (ref >60)
Glucose, Bld: 166 mg/dL — ABNORMAL HIGH (ref 70–99)
Potassium: 3.7 mmol/L (ref 3.5–5.1)
Sodium: 134 mmol/L — ABNORMAL LOW (ref 135–145)
Total Bilirubin: 0.2 mg/dL (ref 0.0–1.2)
Total Protein: 6 g/dL — ABNORMAL LOW (ref 6.5–8.1)

## 2024-05-08 LAB — TROPONIN I (HIGH SENSITIVITY)
Troponin I (High Sensitivity): <2 ng/L (ref <18)
Troponin I (High Sensitivity): <2 ng/L (ref <18)

## 2024-05-08 LAB — LIPASE, BLOOD: Lipase: 30 U/L (ref 11–51)

## 2024-05-08 LAB — HCG, SERUM, QUALITATIVE: Preg, Serum: NEGATIVE

## 2024-05-08 MED ORDER — SODIUM CHLORIDE 0.9 % IV BOLUS
1000.0000 mL | Freq: Once | INTRAVENOUS | Status: AC
  Administered 2024-05-08: 1000 mL via INTRAVENOUS

## 2024-05-08 NOTE — Discharge Instructions (Addendum)
 We saw you in the ER for the chest pain/shortness of breath. All of our cardiac workup is normal, including labs, EKG and chest X-RAY are normal. We are not sure what is causing your discomfort, but we feel comfortable sending you home at this time. The workup in the ER is not complete, and you should follow up with your primary care doctor for further evaluation.

## 2024-05-08 NOTE — ED Provider Notes (Signed)
 [facility]-EMERGENCY DEPT [facility] Emergency Department Provider Note MRN:  [id number]  Arrival date & time: 05/09/24     Chief Complaint   Dizziness History of Present Illness   Mary Mitchell is a 28 y.o. year-old female with a history of bipolar disorder presenting to the ED with chief complaint of dizziness.  45 minutes ago patient experienced sudden onset dizziness described as feeling like she was going to pass out.  Associated with pressure-like chest pain, shortness of breath, general malaise, nausea.  Did not fully pass out.  No symptoms right now.  Review of Systems  A thorough review of systems was obtained and all systems are negative except as noted in the HPI and PMH.   Patient's Health History    Past Medical History:  Diagnosis Date   ADHD (attention deficit hyperactivity disorder)    Asthma    Bipolar 1 disorder (HCC)    PTSD (post-traumatic stress disorder)     Past Surgical History:  Procedure Laterality Date   ABDOMINAL HERNIA REPAIR     HERNIA REPAIR      Family History  Problem Relation Age of Onset   Diabetes Mother    Hypertension Mother    Diabetes Maternal Grandmother    Hypertension Maternal Grandmother    Heart disease Maternal Grandmother    Cystic fibrosis Daughter     Social History   Socioeconomic History   Marital status: Married    Spouse name: Not on file   Number of children: Not on file   Years of education: Not on file   Highest education level: Not on file  Occupational History   Not on file  Tobacco Use   Smoking status: Former    Current packs/day: 0.25    Types: Cigarettes   Smokeless tobacco: Current  Vaping Use   Vaping status: Never Used  Substance and Sexual Activity   Alcohol use: No   Drug use: No   Sexual activity: Yes    Birth control/protection: None  Other Topics Concern   Not on file  Social History Narrative   Not on file   Social Drivers of Health   Financial Resource Strain: Not on  file  Food Insecurity: Not on file  Transportation Needs: Not on file  Physical Activity: Not on file  Stress: Not on file  Social Connections: Not on file  Intimate Partner Violence: Not on file     Physical Exam   Vitals:   05/08/24 0930 05/08/24 1031  BP: (!) 144/84 (!) 142/98  Pulse: 67 78  Resp: (!) 22 (!) 23  Temp:  97.7 F (36.5 C)  SpO2: 99% 97%    CONSTITUTIONAL: Well-appearing, NAD NEURO/PSYCH:  Alert and oriented x 3, no focal deficits EYES:  eyes equal and reactive ENT/NECK:  no LAD, no JVD CARDIO: Regular rate, well-perfused, normal S1 and S2 PULM:  CTAB no wheezing or rhonchi GI/GU:  non-distended, non-tender MSK/SPINE:  No gross deformities, no edema SKIN:  no rash, atraumatic   *Additional and/or pertinent findings included in MDM below  Diagnostic and Interventional Summary    EKG Interpretation Date/Time:  Wednesday May 08 2024 06:39:04 EDT Ventricular Rate:  79 PR Interval:  157 QRS Duration:  94 QT Interval:  413 QTC Calculation: 474 R Axis:   51  Text Interpretation: Sinus rhythm Confirmed by Theadore Sharper [phone]) on 05/08/2024 6:56:04 AM       Labs Reviewed  COMPREHENSIVE METABOLIC PANEL WITH GFR - Abnormal; Notable for the  following components:      Result Value   Sodium 134 (*)    Glucose, Bld 166 (*)    Calcium 8.3 (*)    Total Protein 6.0 (*)    Albumin 3.1 (*)    All other components within normal limits  CBC - Abnormal; Notable for the following components:   RBC 5.19 (*)    MCH 24.7 (*)    MCHC 29.8 (*)    All other components within normal limits  URINALYSIS, ROUTINE W REFLEX MICROSCOPIC - Abnormal; Notable for the following components:   APPearance HAZY (*)    Leukocytes,Ua MODERATE (*)    Bacteria, UA RARE (*)    All other components within normal limits  LIPASE, BLOOD  HCG, SERUM, QUALITATIVE  TROPONIN I (HIGH SENSITIVITY)  TROPONIN I (HIGH SENSITIVITY)    DG Chest Port 1 View  Final Result       Medications  sodium chloride  0.9 % bolus 1,000 mL (0 mLs Intravenous Stopped 05/08/24 0737)     Procedures  /  Critical Care Procedures  ED Course and Medical Decision Making  Initial Impression and Ddx Presyncopal or near syncopal event.  Currently not having any dizziness or symptoms.  Associated with some chest pain.  Differential diagnosis includes arrhythmia, less likely ACS or dissection, PERC negative, doubt PE.  Reassuring vitals at this time, no neurological deficits, will need evaluation with labs, troponin.  Past medical/surgical history that increases complexity of ED encounter: None  Interpretation of Diagnostics I personally reviewed the EKG and my interpretation is as follows: Sinus rhythm  Labs pending  Patient Reassessment and Ultimate Disposition/Management     Signed out to oncoming provider at shift change.  Patient management required discussion with the following services or consulting groups:  None  Complexity of Problems Addressed Acute illness or injury that poses threat of life of bodily function  Additional Data Reviewed and Analyzed Further history obtained from: None  Additional Factors Impacting ED Encounter Risk Consideration of hospitalization  Ozell HERO. Theadore, MD [facility] Emergency Medicine [facility] [email or web address]  Final Clinical Impressions(s) / ED Diagnoses     ICD-10-CM   1. Chest pain, unspecified type  R07.9     2. Near syncope  R55       ED Discharge Orders     None        Discharge Instructions Discussed with and Provided to Patient:     Discharge Instructions      We saw you in the ER for the chest pain/shortness of breath. All of our cardiac workup is normal, including labs, EKG and chest X-RAY are normal. We are not sure what is causing your discomfort, but we feel comfortable sending you home at this time. The workup in the ER is not complete, and you should follow up with your  primary care doctor for further evaluation.        Theadore Ozell HERO, MD 05/09/24 [phone]

## 2024-05-08 NOTE — ED Notes (Signed)
 Discharge instructions, medications, and follow up care reviewed with and provided to pt. Pt denies any further questions, and has verbalized understanding.

## 2024-05-08 NOTE — ED Provider Notes (Signed)
  Physical Exam  BP 124/73   Pulse 79   Temp 98 F (36.7 C)   Resp 19   Ht 5' 2 (1.575 m)   Wt 122.9 kg   LMP 03/30/2024 (Approximate)   SpO2 98%   BMI 49.57 kg/m   Physical Exam  Procedures  Procedures  ED Course / MDM    Medical Decision Making Amount and/or Complexity of Data Reviewed Labs: ordered. Radiology: ordered. ECG/medicine tests: ordered.   Pt comes in w/ cc of chest pain and dizziness. Nonfocal neuro exam. PERC neg. Reassuring EKG. Delta trop ordered. If neg, can d/c.        Charlyn Sora, MD 05/08/24 [phone]

## 2024-05-08 NOTE — ED Triage Notes (Signed)
 PT BIB [facility]. Per EMS PT brought in from work, PT c/c new onset of N/V and dizziness about 45 min PTA  EMS Vitals:  BP: 115/71 SpO2 99% HR 80 CBG 177

## 2024-05-08 NOTE — ED Notes (Signed)
 Pt independently ambulatory to and from the restroom.

## 2024-07-13 ENCOUNTER — Encounter (HOSPITAL_COMMUNITY): Payer: Self-pay | Admitting: Pharmacy Technician

## 2024-07-13 ENCOUNTER — Other Ambulatory Visit: Payer: Self-pay

## 2024-07-13 ENCOUNTER — Emergency Department (HOSPITAL_COMMUNITY)
Admission: EM | Admit: 2024-07-13 | Discharge: 2024-07-13 | Disposition: A | Discharge status: Home / Self Care | Payer: Self-pay | Attending: Emergency Medicine | Admitting: Emergency Medicine

## 2024-07-13 DIAGNOSIS — J45909 Unspecified asthma, uncomplicated: Secondary | ICD-10-CM | POA: Insufficient documentation

## 2024-07-13 DIAGNOSIS — N644 Mastodynia: Secondary | ICD-10-CM | POA: Insufficient documentation

## 2024-07-13 LAB — CBC WITH DIFFERENTIAL/PLATELET
Abs Immature Granulocytes: 0.07 K/uL (ref 0.00–0.07)
Basophils Absolute: 0 K/uL (ref 0.0–0.1)
Basophils Relative: 0 %
Eosinophils Absolute: 0.1 K/uL (ref 0.0–0.5)
Eosinophils Relative: 1 %
HCT: 41.8 % (ref 36.0–46.0)
Hemoglobin: 12.6 g/dL (ref 12.0–15.0)
Immature Granulocytes: 1 %
Lymphocytes Relative: 44 %
Lymphs Abs: 3.3 K/uL (ref 0.7–4.0)
MCH: 24.4 pg — ABNORMAL LOW (ref 26.0–34.0)
MCHC: 30.1 g/dL (ref 30.0–36.0)
MCV: 80.9 fL (ref 80.0–100.0)
Monocytes Absolute: 0.4 K/uL (ref 0.1–1.0)
Monocytes Relative: 5 %
Neutro Abs: 3.7 K/uL (ref 1.7–7.7)
Neutrophils Relative %: 49 %
Platelets: 318 K/uL (ref 150–400)
RBC: 5.17 MIL/uL — ABNORMAL HIGH (ref 3.87–5.11)
RDW: 14.3 % (ref 11.5–15.5)
WBC: 7.5 K/uL (ref 4.0–10.5)
nRBC: 0 % (ref 0.0–0.2)

## 2024-07-13 LAB — COMPREHENSIVE METABOLIC PANEL WITH GFR
ALT: 20 U/L (ref 0–44)
AST: 21 U/L (ref 15–41)
Albumin: 3.4 g/dL — ABNORMAL LOW (ref 3.5–5.0)
Alkaline Phosphatase: 80 U/L (ref 38–126)
Anion gap: 9 (ref 5–15)
BUN: 6 mg/dL (ref 6–20)
CO2: 23 mmol/L (ref 22–32)
Calcium: 8.7 mg/dL — ABNORMAL LOW (ref 8.9–10.3)
Chloride: 106 mmol/L (ref 98–111)
Creatinine, Ser: 0.7 mg/dL (ref 0.44–1.00)
GFR, Estimated: >60 mL/min (ref >60)
Glucose, Bld: 148 mg/dL — ABNORMAL HIGH (ref 70–99)
Potassium: 3.5 mmol/L (ref 3.5–5.1)
Sodium: 138 mmol/L (ref 135–145)
Total Bilirubin: <0.2 mg/dL (ref 0.0–1.2)
Total Protein: 7 g/dL (ref 6.5–8.1)

## 2024-07-13 LAB — HCG, SERUM, QUALITATIVE: Preg, Serum: NEGATIVE

## 2024-07-13 MED ORDER — IBUPROFEN 400 MG PO TABS
600.0000 mg | ORAL_TABLET | Freq: Once | ORAL | Status: AC
  Administered 2024-07-13: 600 mg via ORAL
  Filled 2024-07-13: qty 1

## 2024-07-13 NOTE — ED Triage Notes (Signed)
 Pt here with reports of 3 days of L breast pain. Pt states pain has progressively worsening over those 3 days. Also states she donates plasma, but the last few times her HR was elevated.

## 2024-07-13 NOTE — ED Provider Notes (Signed)
 [place] EMERGENCY DEPARTMENT AT [facility] Provider Note   CSN: [id number] Arrival date & time: 07/13/24  9075     Patient presents with: Breast Pain   Mary Mitchell is a 28 y.o. female.  With a history of asthma who presents the ED for left breast pain.  Patient has experienced pain over her left breast for the last 3 days.  No discharge fevers chills and is not currently breast-feeding.  She donated plasma earlier this week and was noted to be tachycardic and was directed here for further evaluation.  No chest pain shortness of breath nausea vomiting or recent illness.   HPI     Prior to Admission medications   Medication Sig Start Date End Date Taking? Authorizing Provider  doxycycline  (VIBRAMYCIN ) 100 MG capsule Take 1 capsule (100 mg total) by mouth 2 (two) times daily. 03/03/23   Sofia, Leslie K, PA-C    Allergies: Patient has no known allergies.    Review of Systems  Updated Vital Signs BP 132/85   Pulse 77   Temp 98.8 F (37.1 C) (Oral)   Resp 18   SpO2 100%   Physical Exam Vitals and nursing note reviewed.  HENT:     Head: Normocephalic and atraumatic.  Eyes:     Pupils: Pupils are equal, round, and reactive to light.  Cardiovascular:     Rate and Rhythm: Normal rate and regular rhythm.  Pulmonary:     Effort: Pulmonary effort is normal.     Breath sounds: Normal breath sounds.  Abdominal:     Palpations: Abdomen is soft.     Tenderness: There is no abdominal tenderness.  Musculoskeletal:     Comments: Breast examination performed by PA Du per patient's request four no erythema appreciable abscess fluctuance No masses palpable Some mild tenderness over lateral breast tissue  Skin:    General: Skin is warm and dry.  Neurological:     Mental Status: She is alert.  Psychiatric:        Mood and Affect: Mood normal.     (all labs ordered are listed, but only abnormal results are displayed) Labs Reviewed  CBC WITH  DIFFERENTIAL/PLATELET - Abnormal; Notable for the following components:      Result Value   RBC 5.17 (*)    MCH 24.4 (*)    All other components within normal limits  COMPREHENSIVE METABOLIC PANEL WITH GFR - Abnormal; Notable for the following components:   Glucose, Bld 148 (*)    Calcium 8.7 (*)    Albumin 3.4 (*)    All other components within normal limits  HCG, SERUM, QUALITATIVE    EKG: EKG Interpretation Date/Time:  Saturday July 13 2024 10:54:50 EDT Ventricular Rate:  77 PR Interval:  158 QRS Duration:  89 QT Interval:  408 QTC Calculation: 462 R Axis:   70  Text Interpretation: Sinus rhythm Confirmed by Pamella Sharper [phone]) on 07/13/2024 12:18:01 PM  Radiology: No results found.   Procedures   Medications Ordered in the ED  ibuprofen  (ADVIL ) tablet 600 mg (600 mg Oral Given 07/13/24 1134)                                    Medical Decision Making 28 year old female with history as above presents to the ED for reported left breast pain.  No appreciable lesions or abscesses no infectious symptoms.  Secondary complaint of tachycardia  while donating plasma.  Laboratory workup unremarkable aside from hyperglycemia.  EKG without evidence of dysrhythmia.  Pregnancy test negative.  Will discharge with instructions to follow-up in the breast center for further evaluation of breast discomfort return precautions discussed in detail.  Amount and/or Complexity of Data Reviewed Labs: ordered.        Final diagnoses:  Breast pain    ED Discharge Orders     None          Pamella Ozell LABOR, DO 07/13/24 1223

## 2024-07-13 NOTE — Discharge Instructions (Addendum)
 You were seen in the Emergency Department for left breast pain There was no palpable masses or evidence of infection but she will need to follow-up with the [facility] to have further testing such as an ultrasound or mammogram done there Please call the office listed above and let them know you were seen in the emergency department and make an appointment Your blood work EKG looked okay Here there was no fast heart rate or abnormal blood pressures while you were here Your pregnancy test is negative You can also follow-up with a primary doctor at the [facility] health community clinic Return to the emergency room for severe pain trouble breathing, fevers or any other concerns

## 2024-07-15 ENCOUNTER — Other Ambulatory Visit: Payer: Self-pay | Admitting: Emergency Medicine

## 2024-07-15 DIAGNOSIS — N644 Mastodynia: Secondary | ICD-10-CM

## 2024-07-25 ENCOUNTER — Other Ambulatory Visit: Payer: Self-pay

## 2024-08-26 ENCOUNTER — Ambulatory Visit: Payer: Self-pay
